# Patient Record
Sex: Male | Born: 1960 | Race: Black or African American | Hispanic: No | Marital: Single | Smoking: Never smoker
Health system: Southern US, Community
[De-identification: ages and names within clinical notes are randomized; demographics above are authoritative.]

## PROBLEM LIST (undated history)

## (undated) DIAGNOSIS — K219 Gastro-esophageal reflux disease without esophagitis: Secondary | ICD-10-CM

## (undated) DIAGNOSIS — H903 Sensorineural hearing loss, bilateral: Secondary | ICD-10-CM

## (undated) DIAGNOSIS — D126 Benign neoplasm of colon, unspecified: Secondary | ICD-10-CM

## (undated) DIAGNOSIS — N6489 Other specified disorders of breast: Secondary | ICD-10-CM

## (undated) DIAGNOSIS — E079 Disorder of thyroid, unspecified: Secondary | ICD-10-CM

## (undated) DIAGNOSIS — J309 Allergic rhinitis, unspecified: Secondary | ICD-10-CM

## (undated) DIAGNOSIS — E2740 Unspecified adrenocortical insufficiency: Secondary | ICD-10-CM

## (undated) DIAGNOSIS — H269 Unspecified cataract: Secondary | ICD-10-CM

## (undated) DIAGNOSIS — E039 Hypothyroidism, unspecified: Secondary | ICD-10-CM

## (undated) DIAGNOSIS — Z923 Personal history of irradiation: Secondary | ICD-10-CM

## (undated) DIAGNOSIS — B351 Tinea unguium: Secondary | ICD-10-CM

## (undated) DIAGNOSIS — Z125 Encounter for screening for malignant neoplasm of prostate: Secondary | ICD-10-CM

## (undated) DIAGNOSIS — I83893 Varicose veins of bilateral lower extremities with other complications: Secondary | ICD-10-CM

## (undated) DIAGNOSIS — F840 Autistic disorder: Secondary | ICD-10-CM

## (undated) DIAGNOSIS — C801 Malignant (primary) neoplasm, unspecified: Secondary | ICD-10-CM

## (undated) HISTORY — DX: Unspecified cataract: H26.9

## (undated) HISTORY — DX: Hypothyroidism, unspecified: E03.9

## (undated) HISTORY — PX: COLON SURGERY: SHX602

## (undated) HISTORY — PX: HERNIA REPAIR: SHX51

## (undated) HISTORY — DX: Other specified disorders of breast: N64.89

## (undated) HISTORY — DX: Autistic disorder: F84.0

## (undated) HISTORY — PX: EYE SURGERY: SHX253

## (undated) HISTORY — DX: Unspecified adrenocortical insufficiency: E27.40

## (undated) HISTORY — DX: Encounter for screening for malignant neoplasm of prostate: Z12.5

## (undated) HISTORY — DX: Tinea unguium: B35.1

## (undated) HISTORY — PX: OTHER SURGICAL HISTORY: SHX169

## (undated) HISTORY — DX: Benign neoplasm of colon, unspecified: D12.6

## (undated) HISTORY — DX: Varicose veins of bilateral lower extremities with other complications: I83.893

## (undated) HISTORY — DX: Sensorineural hearing loss, bilateral: H90.3

## (undated) HISTORY — DX: Allergic rhinitis, unspecified: J30.9

## (undated) HISTORY — DX: Gastro-esophageal reflux disease without esophagitis: K21.9

---

## 1998-04-15 ENCOUNTER — Emergency Department (HOSPITAL_COMMUNITY): Admission: EM | Admit: 1998-04-15 | Discharge: 1998-04-15 | Payer: Self-pay | Admitting: *Deleted

## 1998-04-16 ENCOUNTER — Inpatient Hospital Stay (HOSPITAL_COMMUNITY): Admission: EM | Admit: 1998-04-16 | Discharge: 1998-05-08 | Payer: Self-pay | Admitting: Emergency Medicine

## 1998-04-16 ENCOUNTER — Encounter: Payer: Self-pay | Admitting: Emergency Medicine

## 1998-04-17 ENCOUNTER — Encounter: Payer: Self-pay | Admitting: Critical Care Medicine

## 1998-04-28 ENCOUNTER — Encounter: Payer: Self-pay | Admitting: Internal Medicine

## 1998-05-04 ENCOUNTER — Encounter: Payer: Self-pay | Admitting: Internal Medicine

## 1998-12-02 ENCOUNTER — Encounter: Admission: RE | Admit: 1998-12-02 | Discharge: 1998-12-02 | Payer: Self-pay | Admitting: Internal Medicine

## 1999-01-06 ENCOUNTER — Encounter: Admission: RE | Admit: 1999-01-06 | Discharge: 1999-01-06 | Payer: Self-pay | Admitting: Internal Medicine

## 1999-02-02 ENCOUNTER — Encounter: Admission: RE | Admit: 1999-02-02 | Discharge: 1999-02-02 | Payer: Self-pay | Admitting: Hematology and Oncology

## 1999-06-24 ENCOUNTER — Encounter: Admission: RE | Admit: 1999-06-24 | Discharge: 1999-06-24 | Payer: Self-pay | Admitting: Internal Medicine

## 2000-01-24 ENCOUNTER — Encounter: Admission: RE | Admit: 2000-01-24 | Discharge: 2000-01-24 | Payer: Self-pay | Admitting: Internal Medicine

## 2000-04-10 ENCOUNTER — Encounter: Admission: RE | Admit: 2000-04-10 | Discharge: 2000-04-10 | Payer: Self-pay | Admitting: Internal Medicine

## 2000-04-18 ENCOUNTER — Encounter: Admission: RE | Admit: 2000-04-18 | Discharge: 2000-04-18 | Payer: Self-pay | Admitting: Hematology and Oncology

## 2001-01-08 ENCOUNTER — Encounter: Admission: RE | Admit: 2001-01-08 | Discharge: 2001-01-08 | Payer: Self-pay | Admitting: Internal Medicine

## 2001-11-07 ENCOUNTER — Encounter: Admission: RE | Admit: 2001-11-07 | Discharge: 2001-11-07 | Payer: Self-pay | Admitting: Internal Medicine

## 2002-06-12 ENCOUNTER — Encounter: Admission: RE | Admit: 2002-06-12 | Discharge: 2002-06-12 | Payer: Self-pay | Admitting: Internal Medicine

## 2002-07-01 ENCOUNTER — Encounter: Admission: RE | Admit: 2002-07-01 | Discharge: 2002-07-01 | Payer: Self-pay | Admitting: Internal Medicine

## 2003-02-06 ENCOUNTER — Encounter: Admission: RE | Admit: 2003-02-06 | Discharge: 2003-02-06 | Payer: Self-pay | Admitting: Internal Medicine

## 2003-06-18 ENCOUNTER — Encounter: Admission: RE | Admit: 2003-06-18 | Discharge: 2003-06-18 | Payer: Self-pay | Admitting: Internal Medicine

## 2003-11-06 ENCOUNTER — Encounter: Admission: RE | Admit: 2003-11-06 | Discharge: 2003-11-06 | Payer: Self-pay | Admitting: Internal Medicine

## 2003-11-24 ENCOUNTER — Encounter: Admission: RE | Admit: 2003-11-24 | Discharge: 2003-11-24 | Payer: Self-pay | Admitting: Internal Medicine

## 2004-03-17 ENCOUNTER — Encounter: Admission: RE | Admit: 2004-03-17 | Discharge: 2004-03-17 | Payer: Self-pay | Admitting: Internal Medicine

## 2004-05-12 ENCOUNTER — Ambulatory Visit: Payer: Self-pay | Admitting: Internal Medicine

## 2004-08-30 ENCOUNTER — Ambulatory Visit: Payer: Self-pay | Admitting: Internal Medicine

## 2004-09-01 ENCOUNTER — Ambulatory Visit: Payer: Self-pay | Admitting: Internal Medicine

## 2004-10-11 ENCOUNTER — Ambulatory Visit: Payer: Self-pay | Admitting: Internal Medicine

## 2004-10-25 ENCOUNTER — Ambulatory Visit: Payer: Self-pay | Admitting: Internal Medicine

## 2005-08-24 ENCOUNTER — Ambulatory Visit: Payer: Self-pay | Admitting: Internal Medicine

## 2005-10-13 ENCOUNTER — Ambulatory Visit: Payer: Self-pay | Admitting: Internal Medicine

## 2005-12-14 ENCOUNTER — Encounter: Payer: Self-pay | Admitting: Pulmonary Disease

## 2005-12-14 ENCOUNTER — Encounter: Payer: Self-pay | Admitting: Internal Medicine

## 2006-04-26 ENCOUNTER — Ambulatory Visit: Payer: Self-pay | Admitting: Internal Medicine

## 2006-06-30 DIAGNOSIS — E039 Hypothyroidism, unspecified: Secondary | ICD-10-CM

## 2006-06-30 HISTORY — DX: Hypothyroidism, unspecified: E03.9

## 2006-08-25 ENCOUNTER — Ambulatory Visit: Payer: Self-pay | Admitting: Hospitalist

## 2006-08-25 ENCOUNTER — Encounter (INDEPENDENT_AMBULATORY_CARE_PROVIDER_SITE_OTHER): Payer: Self-pay | Admitting: Internal Medicine

## 2006-08-25 LAB — CONVERTED CEMR LAB
BUN: 15 mg/dL (ref 6–23)
CO2: 31 meq/L (ref 19–32)
Calcium: 9.5 mg/dL (ref 8.4–10.5)
Chloride: 104 meq/L (ref 96–112)
Creatinine, Ser: 1.03 mg/dL (ref 0.40–1.50)
Free T4: 0.98 ng/dL (ref 0.89–1.80)
Glucose, Bld: 89 mg/dL (ref 70–99)
HCT: 37.3 % — ABNORMAL LOW (ref 39.0–52.0)
Hemoglobin: 11.2 g/dL — ABNORMAL LOW (ref 13.0–17.0)
MCHC: 30 g/dL (ref 30.0–36.0)
MCV: 76.9 fL — ABNORMAL LOW (ref 78.0–100.0)
PSA: 0.43 ng/mL (ref 0.10–4.00)
Platelets: 122 10*3/uL — ABNORMAL LOW (ref 150–400)
Potassium: 4.2 meq/L (ref 3.5–5.3)
RBC: 4.85 M/uL (ref 4.22–5.81)
RDW: 14.3 % — ABNORMAL HIGH (ref 11.5–14.0)
Sodium: 142 meq/L (ref 135–145)
TSH: 0.495 microintl units/mL (ref 0.350–5.50)
WBC: 6.9 10*3/uL (ref 4.0–10.5)

## 2006-09-18 ENCOUNTER — Telehealth: Payer: Self-pay | Admitting: *Deleted

## 2006-09-25 ENCOUNTER — Telehealth: Payer: Self-pay | Admitting: *Deleted

## 2006-12-18 ENCOUNTER — Telehealth (INDEPENDENT_AMBULATORY_CARE_PROVIDER_SITE_OTHER): Payer: Self-pay | Admitting: *Deleted

## 2007-01-17 ENCOUNTER — Telehealth: Payer: Self-pay | Admitting: *Deleted

## 2007-02-05 ENCOUNTER — Encounter (INDEPENDENT_AMBULATORY_CARE_PROVIDER_SITE_OTHER): Payer: Self-pay | Admitting: Internal Medicine

## 2007-02-05 ENCOUNTER — Ambulatory Visit: Payer: Self-pay | Admitting: Internal Medicine

## 2007-02-08 LAB — CONVERTED CEMR LAB
ALT: 13 units/L (ref 0–53)
AST: 21 units/L (ref 0–37)
Albumin: 4.9 g/dL (ref 3.5–5.2)
Alkaline Phosphatase: 74 units/L (ref 39–117)
BUN: 15 mg/dL (ref 6–23)
CO2: 31 meq/L (ref 19–32)
Calcium: 10.1 mg/dL (ref 8.4–10.5)
Chloride: 103 meq/L (ref 96–112)
Creatinine, Ser: 1.08 mg/dL (ref 0.40–1.50)
Glucose, Bld: 69 mg/dL — ABNORMAL LOW (ref 70–99)
HCT: 39.8 % (ref 39.0–52.0)
Hemoglobin: 12.2 g/dL — ABNORMAL LOW (ref 13.0–17.0)
MCHC: 30.7 g/dL (ref 30.0–36.0)
MCV: 79.6 fL (ref 78.0–100.0)
Platelets: 145 10*3/uL — ABNORMAL LOW (ref 150–400)
Potassium: 4 meq/L (ref 3.5–5.3)
RBC: 5 M/uL (ref 4.22–5.81)
RDW: 14.8 % — ABNORMAL HIGH (ref 11.5–14.0)
Sodium: 140 meq/L (ref 135–145)
TSH: 8.1 microintl units/mL — ABNORMAL HIGH (ref 0.350–5.50)
Total Bilirubin: 0.5 mg/dL (ref 0.3–1.2)
Total Protein: 7.8 g/dL (ref 6.0–8.3)
WBC: 7 10*3/uL (ref 4.0–10.5)

## 2007-06-06 ENCOUNTER — Telehealth (INDEPENDENT_AMBULATORY_CARE_PROVIDER_SITE_OTHER): Payer: Self-pay | Admitting: *Deleted

## 2007-12-13 ENCOUNTER — Encounter (INDEPENDENT_AMBULATORY_CARE_PROVIDER_SITE_OTHER): Payer: Self-pay | Admitting: *Deleted

## 2007-12-13 ENCOUNTER — Ambulatory Visit: Payer: Self-pay | Admitting: Infectious Disease

## 2008-03-12 ENCOUNTER — Encounter (INDEPENDENT_AMBULATORY_CARE_PROVIDER_SITE_OTHER): Payer: Self-pay | Admitting: Internal Medicine

## 2008-03-12 ENCOUNTER — Ambulatory Visit: Payer: Self-pay | Admitting: Internal Medicine

## 2008-03-12 LAB — CONVERTED CEMR LAB
BUN: 9 mg/dL (ref 6–23)
Bilirubin Urine: NEGATIVE
Blood in Urine, dipstick: NEGATIVE
CO2: 28 meq/L (ref 19–32)
Calcium: 10.1 mg/dL (ref 8.4–10.5)
Glucose, Bld: 90 mg/dL (ref 70–99)
Glucose, Urine, Semiquant: NEGATIVE
Hemoglobin, Urine: NEGATIVE
Ketones, ur: NEGATIVE mg/dL
Nitrite: NEGATIVE
Nitrite: NEGATIVE
Potassium: 4 meq/L (ref 3.5–5.3)
Protein, U semiquant: NEGATIVE
Specific Gravity, Urine: >=1.03
Specific Gravity, Urine: 1.018 (ref 1.005–1.03)
TSH: 0.752 microintl units/mL (ref 0.350–4.50)
Urobilinogen, UA: 0.2
Urobilinogen, UA: 0.2 (ref 0.0–1.0)
WBC Urine, dipstick: NEGATIVE
pH: 5.5
pH: 6 (ref 5.0–8.0)

## 2008-04-08 ENCOUNTER — Ambulatory Visit: Payer: Self-pay | Admitting: *Deleted

## 2008-05-06 ENCOUNTER — Ambulatory Visit: Payer: Self-pay | Admitting: Internal Medicine

## 2008-05-06 ENCOUNTER — Encounter: Payer: Self-pay | Admitting: Internal Medicine

## 2008-05-06 LAB — CONVERTED CEMR LAB: Ferritin: 85 ng/mL (ref 22–322)

## 2008-05-07 LAB — CONVERTED CEMR LAB
Eosinophils Absolute: 0.3 10*3/uL (ref 0.0–0.7)
Eosinophils Relative: 4 % (ref 0–5)
HCT: 36.3 % — ABNORMAL LOW (ref 39.0–52.0)
Hemoglobin: 11.4 g/dL — ABNORMAL LOW (ref 13.0–17.0)
Lymphocytes Relative: 26 % (ref 12–46)
Lymphs Abs: 1.8 10*3/uL (ref 0.7–4.0)
MCV: 77.7 fL — ABNORMAL LOW (ref 78.0–100.0)
Monocytes Absolute: 0.6 10*3/uL (ref 0.1–1.0)
Monocytes Relative: 8 % (ref 3–12)
WBC: 6.8 10*3/uL (ref 4.0–10.5)

## 2008-05-14 ENCOUNTER — Encounter (INDEPENDENT_AMBULATORY_CARE_PROVIDER_SITE_OTHER): Payer: Self-pay | Admitting: Internal Medicine

## 2008-05-14 ENCOUNTER — Ambulatory Visit: Payer: Self-pay | Admitting: Cardiovascular Disease

## 2008-05-14 ENCOUNTER — Ambulatory Visit (HOSPITAL_COMMUNITY): Admission: RE | Admit: 2008-05-14 | Discharge: 2008-05-14 | Payer: Self-pay | Admitting: Internal Medicine

## 2008-05-19 ENCOUNTER — Encounter (INDEPENDENT_AMBULATORY_CARE_PROVIDER_SITE_OTHER): Payer: Self-pay | Admitting: Internal Medicine

## 2008-05-19 ENCOUNTER — Ambulatory Visit (HOSPITAL_COMMUNITY): Admission: RE | Admit: 2008-05-19 | Discharge: 2008-05-19 | Payer: Self-pay | Admitting: Internal Medicine

## 2008-09-29 ENCOUNTER — Encounter (INDEPENDENT_AMBULATORY_CARE_PROVIDER_SITE_OTHER): Payer: Self-pay | Admitting: Internal Medicine

## 2008-09-29 ENCOUNTER — Ambulatory Visit: Payer: Self-pay | Admitting: *Deleted

## 2008-09-29 LAB — CONVERTED CEMR LAB
ALT: 8 units/L (ref 0–53)
BUN: 15 mg/dL (ref 6–23)
CO2: 28 meq/L (ref 19–32)
Calcium: 9.3 mg/dL (ref 8.4–10.5)
Chloride: 107 meq/L (ref 96–112)
Creatinine, Ser: 1.03 mg/dL (ref 0.40–1.50)
Glucose, Bld: 76 mg/dL (ref 70–99)
Total Bilirubin: 0.6 mg/dL (ref 0.3–1.2)

## 2008-12-15 ENCOUNTER — Telehealth (INDEPENDENT_AMBULATORY_CARE_PROVIDER_SITE_OTHER): Payer: Self-pay | Admitting: Internal Medicine

## 2009-01-14 ENCOUNTER — Ambulatory Visit: Payer: Self-pay | Admitting: Internal Medicine

## 2009-01-14 ENCOUNTER — Encounter (INDEPENDENT_AMBULATORY_CARE_PROVIDER_SITE_OTHER): Payer: Self-pay | Admitting: Internal Medicine

## 2009-01-14 LAB — CONVERTED CEMR LAB
Chloride: 105 meq/L (ref 96–112)
Creatinine, Ser: 1.08 mg/dL (ref 0.40–1.50)
GFR calc non Af Amer: >60 mL/min (ref >60)
Potassium: 4 meq/L (ref 3.5–5.3)
Sodium: 143 meq/L (ref 135–145)
TSH: 0.455 microintl units/mL (ref 0.350–4.500)

## 2009-02-17 ENCOUNTER — Encounter: Payer: Self-pay | Admitting: Internal Medicine

## 2009-02-17 ENCOUNTER — Ambulatory Visit: Payer: Self-pay | Admitting: Internal Medicine

## 2009-02-17 LAB — CONVERTED CEMR LAB
Chloride: 102 meq/L (ref 96–112)
Creatinine, Ser: 1.06 mg/dL (ref 0.40–1.50)
Sodium: 141 meq/L (ref 135–145)

## 2009-06-02 ENCOUNTER — Ambulatory Visit: Payer: Self-pay | Admitting: Internal Medicine

## 2009-06-02 LAB — CONVERTED CEMR LAB

## 2009-09-17 ENCOUNTER — Ambulatory Visit: Payer: Self-pay | Admitting: Internal Medicine

## 2009-09-17 LAB — CONVERTED CEMR LAB
Cholesterol: 146 mg/dL (ref 0–200)
Total CHOL/HDL Ratio: 2.9
Triglycerides: 52 mg/dL (ref <150)

## 2009-09-19 ENCOUNTER — Emergency Department (HOSPITAL_COMMUNITY): Admission: EM | Admit: 2009-09-19 | Discharge: 2009-09-19 | Payer: Self-pay | Admitting: Emergency Medicine

## 2009-12-18 ENCOUNTER — Ambulatory Visit: Payer: Self-pay | Admitting: Internal Medicine

## 2009-12-18 LAB — CONVERTED CEMR LAB
Albumin: 4.8 g/dL (ref 3.5–5.2)
Alkaline Phosphatase: 81 units/L (ref 39–117)
BUN: 15 mg/dL (ref 6–23)
Calcium: 9.9 mg/dL (ref 8.4–10.5)
Chloride: 101 meq/L (ref 96–112)
Free T4: 1.18 ng/dL (ref 0.80–1.80)
Glucose, Bld: 97 mg/dL (ref 70–99)
Hemoglobin, Urine: NEGATIVE
Leukocytes, UA: NEGATIVE
Nitrite: NEGATIVE
Potassium: 4 meq/L (ref 3.5–5.3)
Protein, ur: NEGATIVE mg/dL

## 2010-01-04 ENCOUNTER — Ambulatory Visit: Payer: Self-pay | Admitting: Internal Medicine

## 2010-01-06 LAB — CONVERTED CEMR LAB
Calcium: 10 mg/dL (ref 8.4–10.5)
Creatinine, Ser: 0.99 mg/dL (ref 0.40–1.50)

## 2010-06-22 ENCOUNTER — Telehealth: Payer: Self-pay | Admitting: Internal Medicine

## 2010-06-28 ENCOUNTER — Ambulatory Visit: Payer: Self-pay | Admitting: Internal Medicine

## 2010-06-28 DIAGNOSIS — R634 Abnormal weight loss: Secondary | ICD-10-CM

## 2010-06-28 LAB — CONVERTED CEMR LAB
MCV: 81.2 fL (ref >=78.0)
PSA: 0.51 ng/mL (ref <=4.00)
Platelets: 139 10*3/uL — ABNORMAL LOW (ref 150–400)
WBC: 6.6 10*3/uL (ref 4.0–10.5)

## 2010-07-07 ENCOUNTER — Ambulatory Visit: Payer: Self-pay | Admitting: Internal Medicine

## 2010-07-07 LAB — CONVERTED CEMR LAB
OCCULT 2: NEGATIVE
OCCULT 3: NEGATIVE

## 2010-09-14 NOTE — Assessment & Plan Note (Signed)
Summary: BP/(DEVANI)SB.   Vital Signs:  Patient profile:   50 year old male Height:      68 inches Weight:      156.7 pounds BMI:     23.91 Temp:     97.9 degrees F oral Pulse rate:   90 / minute BP sitting:   140 / 97  (right arm)  Vitals Entered By: Filomena Jungling NT II (Dec 18, 2009 1:32 PM) CC: blood pressure high Is Patient Diabetic? No Pain Assessment Patient in pain? no      Nutritional Status BMI of 19 -24 = normal  Have you ever been in a relationship where you felt threatened, hurt or afraid?No   Does patient need assistance? Functional Status Self care Ambulation Normal   Primary Care Provider:  Bethel Born MD  CC:  blood pressure high.  History of Present Illness: Mr. Riegler is a 50 yo man with PMH as outlined in the EMR comes today for a f/u visit. He comes with his mother, who mostly provides the history.   1. Adrenal insufficiency: He takes hydrocortisone and fludrocortisone.   2. Mental Retardation: Ge goes to school at [employer] and is doing well.   3. Hypothyroidism: He takes his thyroid medications regularly.   4. Urinary frequency: He is going to bathroom more frequently and he denies dysuria. he denies any fever or chills.   5. High blood pressure: He went to a dentist to have his tooth pulled and it was cancelled as his BP was 150/109 2 days ago. They do not check his BP at home.    Preventive Screening-Counseling & Management  Alcohol-Tobacco     Alcohol drinks/day: 0     Smoking Status: never  Caffeine-Diet-Exercise     Does Patient Exercise: yes     Type of exercise: WALKING     Times/week: 5  Current Medications (verified): 1)  Synthroid 100 Mcg Tabs (Levothyroxine Sodium) .... Take 1 Tablet By Mouth Once A Day 2)  Hydrocortisone 20 Mg Tabs (Hydrocortisone) .... Take 1 Tablet By Mouth Once A Day 3)  Fludrocortisone Acetate 0.1 Mg Tabs (Fludrocortisone Acetate) .... Take 1/2 Tablet By Mouth Once A Day 4)  Multivitamins  Tabs (Multiple  Vitamin) .... Take 1 Tablet By Mouth Once A Day  Allergies: No Known Drug Allergies  Review of Systems      See HPI  Physical Exam  General:  alert.   Mouth:  pharynx pink and moist.   Lungs:  normal breath sounds, no crackles, and no wheezes.   Heart:  normal rate and regular rhythm.     Impression & Recommendations:  Problem # 1:  HYPERTENSION (ICD-401.9) Pt has multiple documented increased BP readings, especially diastolic BP. He has been counselled multiple times in the past to take less salt and he complies with this. 2 days ago his elective procedure for tooth extraction was stopped because of his BP which was 150/109. His BP on last visit was 160/90 and today it was 140/97. So at this point, I feel comfortable to diagnose him as having HTN. My plan is to decrease the dose of fludrocortisone from 0.1 mg to 0.05 mg daily and f/u in 2 wks. Then we will check his BP, orthostatic pressure, plasma renin activity (with goal PRA at upper normal range). He and his mother is instructed to note for any dizziness or any other funny symptoms and to notify the clinic asap. Please note today that he is not orthostatic.  He is also instructed not to take any extra salt.   Problem # 2:  FREQUENCY, URINARY (ICD-788.41) Doubt this is UTI, given complete symptomatology. Noted pt had this symptom in the past as well. Since he is taking hydrocortisone and is at risk for DM and this can also cause urinary frequency, will check A1C.  Orders: T-Hgb A1C (in-house) ([id number]) T-Urinalysis ([id number])   Problem # 3:  PREVENTIVE HEALTH CARE (ICD-V70.0) Pt and mother denies to get colonoscopy today as well. I noted microcytic anemia in the past and after reading prior office notes there was also some concern for wt. loss. So I would make a stronger recommendation for a colonoscopy on next appt.   Problem # 4:  ADRENAL INSUFFICIENCY, HX OF (ICD-V13.8) Please see HTN. Will check following.    Orders: T-Comprehensive Metabolic Panel ([id number])   Problem # 5:  HYPOTHYROIDISM (ICD-244.9)  Will check followings.   Orders: T-TSH ([id number]) T-T4, Free [id number])   His updated medication list for this problem includes:    Synthroid 100 Mcg Tabs (Levothyroxine sodium) .Marland Kitchen... Take 1 tablet by mouth once a day    Complete Medication List: 1)  Synthroid 100 Mcg Tabs (Levothyroxine sodium) .... Take 1 tablet by mouth once a day 2)  Hydrocortisone 20 Mg Tabs (Hydrocortisone) .... Take 1 tablet by mouth once a day 3)  Fludrocortisone Acetate 0.1 Mg Tabs (Fludrocortisone acetate) .... Take 1/2 tablet by mouth once a day 4)  Multivitamins Tabs (Multiple vitamin) .... Take 1 tablet by mouth once a day T-Comprehensive Metabolic Panel ([id number]) T-TSH [phone]) T-T4, Free [phone]) T-Hgb A1C (in-house) ([id number]) T-Urinalysis ([id number])  Patient Instructions: 1)  Limit your Sodium (Salt) to less than 2 grams a day(slightly less than 1/2 a teaspoon) to prevent fluid retention, swelling, or worsening of symptoms. 2)  It is important that you exercise regularly at least 20 minutes 5 times a week. If you develop chest pain, have severe difficulty breathing, or feel very tired , stop exercising immediately and seek medical attention. 3)  You need to lose weight. Consider a lower calorie diet and regular exercise.  4)  Check your Blood Pressure regularly. If it is above: you should make an appointment. 5)  Please schedule a follow-up appointment in 2 weeks. Process Orders Check Orders Results:     [facility]: Check successful Tests Sent for requisitioning (Dec 18, 2009 2:36 PM):     12/18/2009: [facility] -- T-Comprehensive Metabolic Panel [[id number]] (signed)     12/18/2009: [facility] -- T-TSH [phone] (signed)     12/18/2009: [facility] -- [place], [place] [[id number]] (signed)      12/18/2009: [facility] -- T-Urinalysis [phone] (signed)    Process Orders Check Orders Results:     [facility]: Check successful Tests Sent for requisitioning (Dec 18, 2009 2:36 PM):     12/18/2009: [facility] -- T-Comprehensive Metabolic Panel [[id number]] (signed)     12/18/2009: [facility] -- T-TSH [phone] (signed)     12/18/2009: [facility] -- [place], [place] [[id number]] (signed)     12/18/2009: [facility] -- T-Urinalysis [[id number]] (signed)    Prevention & Chronic Care Immunizations   Influenza vaccine: Fluvax 3+  (06/02/2009)   Influenza vaccine deferral: Deferred  (09/17/2009)   Influenza vaccine due: 04/15/2010    Tetanus booster: Not documented   Td booster deferral: Deferred  (06/02/2009)   Tetanus booster due: 02/18/2019    Pneumococcal vaccine: Not  documented   Pneumococcal vaccine deferral: Refused  (09/17/2009)  Colorectal Screening   Hemoccult: Not documented   Hemoccult action/deferral: Refused  (09/17/2009)    Colonoscopy: Not documented   Colonoscopy action/deferral: Refused  (12/18/2009)  Other Screening   PSA: 0.43  (08/25/2006)   PSA action/deferral: Discussed-decision deferred  (06/02/2009)   Smoking status: never  (12/18/2009)  Lipids   Total Cholesterol: 146  (09/17/2009)   Lipid panel action/deferral: Lipid Panel ordered   LDL: 85  (09/17/2009)   LDL Direct: Not documented   HDL: 51  (09/17/2009)   Triglycerides: 52  (09/17/2009)  Hypertension   Last Blood Pressure: 140 / 97  (12/18/2009)   Serum creatinine: 1.06  (02/17/2009)   Serum potassium 4.3  (02/17/2009) CMP ordered   Self-Management Support :    Hypertension self-management support: Not documented   Nursing Instructions: GI referral for screening colonoscopy (see order)     Impression & Recommendations:  Orders: T-Hgb A1C (in-house)  ([id number]) T-Urinalysis ([id number])   Orders: T-Comprehensive Metabolic Panel ([id number])   His updated medication list for this problem includes:    Synthroid 100 Mcg Tabs (Levothyroxine sodium) .Marland Kitchen... Take 1 tablet by mouth once a day  Orders: T-TSH ([id number]) T-T4, Free [id number])   Complete Medication List: 1)  Synthroid 100 Mcg Tabs (Levothyroxine sodium) .... Take 1 tablet by mouth once a day 2)  Hydrocortisone 20 Mg Tabs (Hydrocortisone) .... Take 1 tablet by mouth once a day 3)  Fludrocortisone Acetate 0.1 Mg Tabs (Fludrocortisone acetate) .... Take 1/2 tablet by mouth once a day 4)  Multivitamins Tabs (Multiple vitamin) .... Take 1 tablet by mouth once a day   Appended Document: Lab Order/a1c result    Lab Visit  Laboratory Results   Blood Tests   Date/Time Received: Dec 18, 2009 2:37 PM Date/Time Reported: Alric Quan  Dec 18, 2009 2:37 PM   HGBA1C: 5.4%   (Normal Range: Non-Diabetic - 3-6%   Control Diabetic - 6-8%)    Orders Today:   Appended Document: orthostatic bp.    Nurse Visit   Vital Signs:  Patient profile:   50 year old male Pulse (ortho):   86 / minute BP standing:   130 / 100  Serial Vital Signs/Assessments:  Time      Position  BP       Pulse  Resp  Temp     By 3:26 PM   Lying RA  130/100  86                    Mamie Hague NT II 3:26 PM   Sitting   130/90   86                    Mamie Hague NT II 3:26 PM   Standing  130/100  86                    Mamie Hague NT II   Allergies: No Known Drug Allergies

## 2010-09-14 NOTE — Assessment & Plan Note (Signed)
Summary: FU/SB.   Vital Signs:  Patient profile:   50 year old male Height:      68 inches Weight:      155.6 pounds BMI:     23.74 Temp:     97.2 degrees F oral Pulse rate:   84 / minute BP sitting:   133 / 84  (right arm)  Vitals Entered By: Filomena Jungling NT II (Jan 04, 2010 9:19 AM) CC: follow-up visit Is Patient Diabetic? No Pain Assessment Patient in pain? no      Nutritional Status BMI of 19 -24 = normal  Have you ever been in a relationship where you felt threatened, hurt or afraid?No   Does patient need assistance? Functional Status Self care Ambulation Normal   Primary Care Provider:  Bethel Born MD  CC:  follow-up visit.  History of Present Illness: 48yom with pmh outlined in chart here for 2 week check up.  At last visit he had been having trouble with HTN, a dental procedure had been cancelled for BP of 150/109.  He saw Dr. Aleene Davidson who decreased his fludrocortisone dose from 0.1 to 0.05.  Today he is here for lab results and BP recheck.  He is here with his mother who is his primary care taker.  They report that he has been doing well, no change in energy level, no dizzyness, no change in appetite.  He continues to have urinary frequency, no other urinary tract symptoms, UA at last visit was negative.  Preventive Screening-Counseling & Management  Alcohol-Tobacco     Alcohol drinks/day: 0     Smoking Status: never  Caffeine-Diet-Exercise     Does Patient Exercise: yes     Type of exercise: WALKING     Times/week: 5  Current Medications (verified): 1)  Synthroid 100 Mcg Tabs (Levothyroxine Sodium) .... Take 1 Tablet By Mouth Once A Day 2)  Hydrocortisone 20 Mg Tabs (Hydrocortisone) .... Take 1 Tablet By Mouth Once A Day 3)  Fludrocortisone Acetate 0.1 Mg Tabs (Fludrocortisone Acetate) .... Take 1/2 Tablet By Mouth Once A Day 4)  Multivitamins  Tabs (Multiple Vitamin) .... Take 1 Tablet By Mouth Once A Day  Allergies (verified): No Known Drug  Allergies  Review of Systems       per hpi  Physical Exam  General:  alert and well-developed.   Head:  normocephalic and atraumatic.   Mouth:  pharynx pink and moist, poor dentition, and teeth missing.   Lungs:  normal respiratory effort and normal breath sounds.   Heart:  normal rate, regular rhythm, no murmur, and no gallop.   Pulses:  2+ Extremities:  no edema Neurologic:  alert & oriented X3, cranial nerves II-XII intact, strength normal in all extremities, and gait normal.   Psych:  Oriented X3, memory intact for recent and remote, normally interactive, and good eye contact.     Impression & Recommendations:  Problem # 1:  HYPERTENSION (ICD-401.9) improved from last few clinic readings of 140, 160 and from the dental office reading of 150/100.  I suspect as there are many normal bps in his record that there is some fluctuation in his bp and he was probably (understandably) nervous at the dental office.  Anyway the decrease in fludrocortisone has not seemed to cause any sypmtoms so I will continue at the current dose.  Discussed with he and his mother again that if they notice fatigue, confusion, edema, etc he should come right in.  BP today: 133/84 Prior BP:  130/100 (12/18/2009)  Labs Reviewed: K+: 4.0 (12/18/2009) Creat: : 1.06 (12/18/2009)   Chol: 146 (09/17/2009)   HDL: 51 (09/17/2009)   LDL: 85 (09/17/2009)   TG: 52 (09/17/2009)  Problem # 2:  FREQUENCY, URINARY (ICD-788.41) no dysuria, no incontinence, UA at last check was wnl. does have significant family hx of prostate CA so will recheck PSA.  Orders: T-PSA ([id number])  Problem # 3:  ADRENAL INSUFFICIENCY, HX OF (ICD-V13.8)  New change in fludrocortisone dose (0.1mg  to 0.05mg ) seems to have helped blood pressure and he is tolerating the change well.  Do not see that we need to check the renin level.  Will check a BMET.  Orders: T-Basic Metabolic Panel [id number])  Problem # 4:  CARDIAC MURMUR, SYSTOLIC  (ICD-785.2) Noted. 2/6 systolic murmur.  Asymptomatic and chronic.  Complete Medication List: 1)  Synthroid 100 Mcg Tabs (Levothyroxine sodium) .... Take 1 tablet by mouth once a day 2)  Hydrocortisone 20 Mg Tabs (Hydrocortisone) .... Take 1 tablet by mouth once a day 3)  Fludrocortisone Acetate 0.1 Mg Tabs (Fludrocortisone acetate) .... Take 1/2 tablet by mouth once a day 4)  Multivitamins Tabs (Multiple vitamin) .... Take 1 tablet by mouth once a day  Patient Instructions: 1)  You had labwork done today, we will call you if there is anything that needs to be discussed before your next appointment. 2)  Please schedule a follow-up appointment in 3 months. 3)  Please call the clinic if you notice decreased energy, dizzyness or fainting.  Prevention & Chronic Care Immunizations   Influenza vaccine: Fluvax 3+  (06/02/2009)   Influenza vaccine deferral: Deferred  (09/17/2009)   Influenza vaccine due: 04/15/2010    Tetanus booster: Not documented   Td booster deferral: Deferred  (06/02/2009)   Tetanus booster due: 02/18/2019    Pneumococcal vaccine: Not documented   Pneumococcal vaccine deferral: Refused  (09/17/2009)  Colorectal Screening   Hemoccult: Not documented   Hemoccult action/deferral: Refused  (09/17/2009)    Colonoscopy: Not documented   Colonoscopy action/deferral: Refused  (12/18/2009)  Other Screening   PSA: 0.43  (08/25/2006)   PSA ordered.   PSA action/deferral: Discussed-decision deferred  (06/02/2009)   Smoking status: never  (01/04/2010)  Lipids   Total Cholesterol: 146  (09/17/2009)   Lipid panel action/deferral: Lipid Panel ordered   LDL: 85  (09/17/2009)   LDL Direct: Not documented   HDL: 51  (09/17/2009)   Triglycerides: 52  (09/17/2009)  Hypertension   Last Blood Pressure: 133 / 84  (01/04/2010)   Serum creatinine: 1.06  (12/18/2009)   Serum potassium 4.0  (12/18/2009)    Hypertension flowsheet reviewed?: Yes   Progress toward BP goal:  Improved  Self-Management Support :    Patient will work on the following items until the next clinic visit to reach self-care goals:     Medications and monitoring: take my medicines every day  (01/04/2010)     Eating: eat more vegetables, eat foods that are low in salt, eat baked foods instead of fried foods, eat fruit for snacks and desserts  (01/04/2010)     Activity: take a 30 minute walk every day  (01/04/2010)    Hypertension self-management support: Not documented   Process Orders Check Orders Results:     [facility]: Check successful Tests Sent for requisitioning (Jan 06, 2010 7:53 AM):     01/04/2010: [facility] -- T-PSA [[id number]] (signed)     01/04/2010: [facility] -- T-Basic Metabolic Panel [  [id number]] (signed)

## 2010-09-14 NOTE — Progress Notes (Signed)
Summary: refill/gg  Phone Note Refill Request  on June 22, 2010 3:54 PM  Refills Requested: Medication #1:  HYDROCORTISONE 20 MG TABS Take 1 tablet by mouth once a day   Dosage confirmed as above?Dosage Confirmed   Last Refilled: 03/13/2010  Medication #2:  SYNTHROID 100 MCG TABS Take 1 tablet by mouth once a day   Dosage confirmed as above?Dosage Confirmed   Last Refilled: 03/13/2010  Method Requested: Electronic Initial call taken by: Merrie Roof RN,  June 22, 2010 3:55 PM  Follow-up for Phone Call       Follow-up by: Bethel Born MD,  June 22, 2010 4:54 PM    Prescriptions: SYNTHROID 100 MCG TABS (LEVOTHYROXINE SODIUM) Take 1 tablet by mouth once a day  #90 x 2   Entered and Authorized by:   Bethel Born MD   Signed by:   Bethel Born MD on 06/22/2010   Method used:   Electronically to        [facility] [phone]* (retail)       [address]       [place], [place]  [address]       Ph: [phone]       Fax: [phone]   RxID:   [id number] HYDROCORTISONE 20 MG TABS (HYDROCORTISONE) Take 1 tablet by mouth once a day  #90 x 2   Entered and Authorized by:   Bethel Born MD   Signed by:   Bethel Born MD on 06/22/2010   Method used:   Electronically to        [facility] [phone]* (retail)       [address]       [place], [place]  [address]       Ph: [phone]       Fax: [phone]   RxID:   [id number]

## 2010-09-14 NOTE — Assessment & Plan Note (Signed)
Summary: losing weight/cfb   Vital Signs:  Patient profile:   50 year old male Height:      68 inches Weight:      164.2 pounds BMI:     25.06 Temp:     97.1 degrees F oral Pulse rate:   92 / minute BP sitting:   160 / 90  (right arm)  Vitals Entered By: Filomena Jungling NT II (September 17, 2009 8:47 AM) CC: hearing,  Is Patient Diabetic? No Pain Assessment Patient in pain? no      Nutritional Status BMI of 25 - 29 = overweight  Have you ever been in a relationship where you felt threatened, hurt or afraid?No   Does patient need assistance? Functional Status Self care Ambulation Normal Comments lives with mother   Primary Care Provider:  Bethel Born MD  CC:  hearing and .  History of Present Illness: 50 y/o man with autism comes for a routine follow up. No complaints.   Mother takes care of him. She feels that his bahavior is stable. No significant changes. He goes to special school and likes it there. He eats and drinks properly. Use to follow with behavioral health but  they said they could not do much so have not gone there in a while.     Preventive Screening-Counseling & Management  Alcohol-Tobacco     Alcohol drinks/day: 0     Smoking Status: never  Caffeine-Diet-Exercise     Does Patient Exercise: yes     Type of exercise: WALKING     Times/week: 5  Current Medications (verified): 1)  Synthroid 100 Mcg Tabs (Levothyroxine Sodium) .... Take 1 Tablet By Mouth Once A Day 2)  Hydrocortisone 20 Mg Tabs (Hydrocortisone) .... Take 1 Tablet By Mouth Once A Day 3)  Fludrocortisone Acetate 0.1 Mg Tabs (Fludrocortisone Acetate) .... Take 1 Tablet By Mouth Once A Day 4)  Multivitamins  Tabs (Multiple Vitamin) .... Take 1 Tablet By Mouth Once A Day  Allergies (verified): No Known Drug Allergies   Review of Systems  The patient denies anorexia, fever, weight loss, weight gain, vision loss, decreased hearing, hoarseness, chest pain, syncope, dyspnea on exertion,  peripheral edema, prolonged cough, headaches, hemoptysis, abdominal pain, melena, hematochezia, severe indigestion/heartburn, hematuria, incontinence, genital sores, muscle weakness, suspicious skin lesions, transient blindness, difficulty walking, depression, unusual weight change, abnormal bleeding, enlarged lymph nodes, angioedema, breast masses, and testicular masses.    Physical Exam  General:  alert, well-developed, well-nourished, well-hydrated, normal appearance, cooperative to examination, and good hygiene.   Head:  normocephalic and atraumatic.   Eyes:  vision grossly intact, pupils equal, pupils round, and pupils reactive to light.   Ears:  R ear normal and L ear normal.   Nose:  no external deformity and no nasal discharge.   Mouth:  teeth missing.   Neck:  supple, no thyromegaly, and no JVD.   Lungs:  normal respiratory effort, normal breath sounds, no dullness, and no wheezes.   Heart:  normal rate, regular rhythm, and systolic ejection murmur.   Abdomen:  soft, non-tender, normal bowel sounds, no distention, no masses, no guarding, no rigidity, no hepatomegaly, and no splenomegaly.   Pulses:  R radial normal and L radial normal.   Extremities:  No edema, cyanosis or clubbing bilaterally Neurologic:  alert & oriented X3.  strength normal in all extremities, sensation intact to light touch, and gait normal.     Impression & Recommendations:  Problem # 1:  ADRENAL  INSUFFICIENCY, HX OF (ICD-V13.8) stable. No complalints. Vitals ok. Had BMETs recently which were normal. Will get at the next visit.   Problem # 2:  HYPOTHYROIDISM (ICD-244.9)  No significant wt changes.  Will check TSH at next visit.  No symptoms manadating need for TSH at this time.   His updated medication list for this problem includes:    Synthroid 100 Mcg Tabs (Levothyroxine sodium) .Marland Kitchen... Take 1 tablet by mouth once a day  Labs Reviewed: TSH: 0.413 (02/17/2009)     Problem # 3:  MENTAL RETARDATION  (ICD-319) Stable. Good communciation at this visit. Enjoys school. Is able to perform basic activities. Eats and drinks ok. Good social support. Will talk to Dorothe Pea to see if we can utillize any further community resources for better QOL.   Complete Medication List: 1)  Synthroid 100 Mcg Tabs (Levothyroxine sodium) .... Take 1 tablet by mouth once a day 2)  Hydrocortisone 20 Mg Tabs (Hydrocortisone) .... Take 1 tablet by mouth once a day 3)  Fludrocortisone Acetate 0.1 Mg Tabs (Fludrocortisone acetate) .... Take 1 tablet by mouth once a day 4)  Multivitamins Tabs (Multiple vitamin) .... Take 1 tablet by mouth once a day  Other Orders: T-Lipid Profile ([id number])  Patient Instructions: 1)  Please schedule a follow-up appointment in 4 months. Prescriptions: FLUDROCORTISONE ACETATE 0.1 MG TABS (FLUDROCORTISONE ACETATE) Take 1 tablet by mouth once a day  #90 x 2   Entered and Authorized by:   Bethel Born MD   Signed by:   Bethel Born MD on 09/17/2009   Method used:   Electronically to        [facility] [phone]* (retail)       [address]       [place], [place]  [address]       Ph: [phone]       Fax: [phone]   RxID:   [id number] HYDROCORTISONE 20 MG TABS (HYDROCORTISONE) Take 1 tablet by mouth once a day  #90 x 2   Entered and Authorized by:   Bethel Born MD   Signed by:   Bethel Born MD on 09/17/2009   Method used:   Electronically to        [facility] [phone]* (retail)       [address]       [place], [place]  [address]       Ph: [phone]       Fax: [phone]   RxID:   [id number] SYNTHROID 100 MCG TABS (LEVOTHYROXINE SODIUM) Take 1 tablet by mouth once a day  #90 x 2   Entered and Authorized by:   Bethel Born MD   Signed by:   Bethel Born MD on 09/17/2009   Method used:   Electronically to        [facility] [address] [phone]* (retail)       [address]       [place], [place]  [address]       Ph: [phone]        Fax: [phone]   RxID:   [id number] FLUDROCORTISONE ACETATE 0.1 MG TABS (FLUDROCORTISONE ACETATE) Take 1 tablet by mouth once a day  #90 x 2   Entered and Authorized by:   Bethel Born MD   Signed by:   Bethel Born MD on 09/17/2009   Method used:   Electronically to        [facility]  [address] [phone]* (retail)  [address]       [place], [place]  [address]       Ph: [phone]       Fax: [phone]   RxID:   [id number] HYDROCORTISONE 20 MG TABS (HYDROCORTISONE) Take 1 tablet by mouth once a day  #90 x 2   Entered and Authorized by:   Bethel Born MD   Signed by:   Bethel Born MD on 09/17/2009   Method used:   Electronically to        [facility]  [address] [phone]* (retail)       [address]       [place], [place]  [address]       Ph: [phone]       Fax: [phone]   RxID:   [id number] SYNTHROID 100 MCG TABS (LEVOTHYROXINE SODIUM) Take 1 tablet by mouth once a day  #90 x 2   Entered and Authorized by:   Bethel Born MD   Signed by:   Bethel Born MD on 09/17/2009   Method used:   Electronically to        [facility]  [address] [phone]* (retail)       [address]       [place], [place]  [address]       Ph: [phone]       Fax: [phone]   RxID:   [id number]   Prevention & Chronic Care Immunizations   Influenza vaccine: Fluvax 3+  (06/02/2009)   Influenza vaccine deferral: Deferred  (09/17/2009)   Influenza vaccine due: 04/15/2010    Tetanus booster: Not documented   Td booster deferral: Deferred  (06/02/2009)   Tetanus booster due: 02/18/2019    Pneumococcal vaccine: Not documented   Pneumococcal vaccine deferral: Refused  (09/17/2009)  Colorectal Screening   Hemoccult: Not documented   Hemoccult action/deferral: Refused  (09/17/2009)    Colonoscopy: Not documented   Colonoscopy action/deferral: Refused  (09/17/2009)  Other Screening   PSA: 0.43   (08/25/2006)   PSA action/deferral: Discussed-decision deferred  (06/02/2009)   Smoking status: never  (09/17/2009)  Lipids   Total Cholesterol: Not documented   Lipid panel action/deferral: Lipid Panel ordered   LDL: Not documented   LDL Direct: Not documented   HDL: Not documented   Triglycerides: Not documented  Process Orders Check Orders Results:     [facility]: Check successful Order queued for requisitioning for [facility]: September 17, 2009 1:37 PM  Tests Sent for requisitioning (September 17, 2009 1:37 PM):     09/17/2009: [facility] -- T-Lipid Profile [id number] (signed)   Please disregard last three prescription

## 2010-09-14 NOTE — Assessment & Plan Note (Signed)
Summary: WEIGHT LOSS/DEVANI/DS   Vital Signs:  Patient profile:   50 year old male Height:      68 inches (172.72 cm) Weight:      151.01 pounds (68.64 kg) BMI:     23.04 Temp:     98.4 degrees F (36.89 degrees C) oral Pulse rate:   72 / minute BP sitting:   133 / 89  (right arm) Cuff size:   regular  Vitals Entered By: Angelina Ok RN (June 28, 2010 11:46AM) Is Patient Diabetic? No Pain Assessment Patient in pain? no      Nutritional Status BMI of 19 -24 = normal  Have you ever been in a relationship where you felt threatened, hurt or afraid?No   Does patient need assistance? Functional Status Self care, Cook/clean, Shopping, Social activities Ambulation Normal Comments Needs assistance with some ADL's.  Losing weight. More forgetful.  Appetite is still good.  Slower in moving around.   Primary Care Provider:  Bethel Born MD   History of Present Illness: Pt is a 50 yo AAM with PMH of HTN, mental retardation, adrenal insufficiency who came here for concern of weight loss. His mother came here with him and concerned of his weight loss. His weight usually ranges 155-165. last vist 6 months ago was 155 and he has been eating well, no chest pain, SOB, dizziness, fatigue. No melena or hematochezia. He has been taking his meds a s instructed.   Depression History:      The patient denies a depressed mood most of the day and a diminished interest in his usual daily activities.         Preventive Screening-Counseling & Management  Alcohol-Tobacco     Alcohol drinks/day: 0     Smoking Status: never  Problems Prior to Update: 1)  Hypertension  (ICD-401.9) 2)  Frequency, Urinary  (ICD-788.41) 3)  Preventive Health Care  (ICD-V70.0) 4)  Cardiac Murmur, Systolic  (ICD-785.2) 5)  Adrenal Insufficiency, Hx of  (ICD-V13.8) 6)  Mental Retardation  (ICD-319) 7)  Hypothyroidism  (ICD-244.9)  Medications Prior to Update: 1)  Synthroid 100 Mcg Tabs (Levothyroxine Sodium)  .... Take 1 Tablet By Mouth Once A Day 2)  Hydrocortisone 20 Mg Tabs (Hydrocortisone) .... Take 1 Tablet By Mouth Once A Day 3)  Fludrocortisone Acetate 0.1 Mg Tabs (Fludrocortisone Acetate) .... Take 1/2 Tablet By Mouth Once A Day 4)  Multivitamins  Tabs (Multiple Vitamin) .... Take 1 Tablet By Mouth Once A Day  Current Medications (verified): 1)  Synthroid 100 Mcg Tabs (Levothyroxine Sodium) .... Take 1 Tablet By Mouth Once A Day 2)  Hydrocortisone 20 Mg Tabs (Hydrocortisone) .... Take 1 Tablet By Mouth Once A Day 3)  Fludrocortisone Acetate 0.1 Mg Tabs (Fludrocortisone Acetate) .... Take 1/2 Tablet By Mouth Once A Day 4)  Multivitamins  Tabs (Multiple Vitamin) .... 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 3 uncles with prostate cancer.  Social History: Last updated: 03/12/2008 Does not smoke, do drugs, or drink EtOH.  Risk Factors: Smoking Status: never (06/28/2010)  Family History: Reviewed history from 01/14/2009 and no changes required. Mother has DM2, h/o breast cancer, & HTN. 3 uncles  with prostate cancer.  Social History: Reviewed history from 03/12/2008 and no changes required. Does not smoke, do drugs, or drink EtOH.  Review of Systems       The patient complains of weight loss.  The patient denies anorexia, fever, decreased hearing, hoarseness, chest pain, syncope, dyspnea on exertion, peripheral edema, prolonged cough, hemoptysis, abdominal pain, melena, and hematochezia.    Physical Exam  General:  alert, well-developed, well-nourished, well-hydrated, appropriate dress, normal  appearance, and healthy-appearing.   Head:  normocephalic.   Nose:  no nasal discharge.   Mouth:  pharynx pink and moist.   Neck:  supple.   Lungs:  normal respiratory effort, no accessory muscle use, normal breath sounds, no crackles, and no wheezes.   Heart:  normal rate, regular rhythm, no murmur, and no JVD.   Abdomen:  soft, non-tender, normal bowel sounds, no distention, and no masses.   Msk:  normal ROM, no joint tenderness, no joint swelling, and no joint warmth.   Pulses:  2+ Extremities:  no edema.  Neurologic:  alert & oriented X3, cranial nerves II-XII intact, strength normal in all extremities, sensation intact to light touch, gait normal, and DTRs symmetrical and normal.     Impression & Recommendations:  Problem # 1:  WEIGHT LOSS (ICD-783.21) Assessment New He lost about 4 lbs in the past 6 months. No active GI bleeding sign or fatigue. His 2 uncle has prostate cancer, the mother is worried cancer. Since weight loss is not very dramatic and PE did not show any cachexia. I told them the cancer is less likely, but needs to r/u, so will check stool card to r/u any Gi malignancy and PSA for prostate cancer.  His adrenal insufficieny and hypothyroidism may also contribute to this. Will recheck his weight at next visit.  Orders: T-CBC No Diff ([id number])  Problem # 2:  HYPERTENSION (ICD-401.9) Assessment: Unchanged Well controlled w/o any meds. So continue to observe.  BP today: 133/89 Prior BP: 133/84 (01/04/2010)  Labs Reviewed: K+: 4.1 (01/04/2010) Creat: : 0.99 (01/04/2010)   Chol: 146 (09/17/2009)   HDL: 51 (09/17/2009)   LDL: 85 (09/17/2009)   TG: 52 (09/17/2009)  Problem # 3:  HYPOTHYROIDISM (ICD-244.9) Assessment: Unchanged Will continue synthroid.  His updated medication list for this problem includes:    Synthroid 100 Mcg Tabs (Levothyroxine sodium) .Marland Kitchen... Take 1 tablet by mouth once a day  Labs Reviewed: TSH: 0.708 (12/18/2009)    HgBA1c: 5.4  (12/18/2009) Chol: 146 (09/17/2009)   HDL: 51 (09/17/2009)   LDL: 85 (09/17/2009)   TG: 52 (09/17/2009)  Problem # 4:  ADRENAL INSUFFICIENCY, HX OF (ICD-V13.8) Assessment: Unchanged Will continue to take his steroids. No anusea.  Complete Medication List: 1)  Synthroid 100 Mcg Tabs (Levothyroxine sodium) .... Take 1 tablet by mouth once a day 2)  Hydrocortisone 20 Mg Tabs (Hydrocortisone) .... Take 1 tablet by mouth once a day 3)  Fludrocortisone Acetate 0.1 Mg Tabs (Fludrocortisone acetate) .... Take 1/2 tablet by mouth once a day 4)  Multivitamins Tabs (Multiple vitamin) .... Take 1 tablet by mouth once a day  Other Orders: T-PSA Total ([id number]) T-Hemoccult Card-Multiple (take home) (99371)  Patient Instructions: 1)  Please schedule a follow-up appointment in 1 month. 2)  Will call you if any abnormal lab results.   Orders Added: 1)  T-PSA Total [[id number]] 2)  T-Hemoccult Card-Multiple (take home) [[id number]] 3)  T-CBC No Diff [[id number]] 4)  Est. Patient Level IV [01751]    Prevention &  Chronic Care Immunizations   Influenza vaccine: Fluvax 3+  (06/02/2009)   Influenza vaccine deferral: Deferred  (09/17/2009)   Influenza vaccine due: 04/15/2010    Tetanus booster: Not documented   Td booster deferral: Deferred  (06/02/2009)   Tetanus booster due: 02/18/2019    Pneumococcal vaccine: Not documented   Pneumococcal vaccine deferral: Refused  (09/17/2009)  Colorectal Screening   Hemoccult: Not documented   Hemoccult action/deferral: Ordered  (06/28/2010)    Colonoscopy: Not documented   Colonoscopy action/deferral: Refused  (12/18/2009)  Other Screening   PSA: 0.61  (01/04/2010)   PSA ordered.   PSA action/deferral: Discussed-PSA requested  (06/28/2010)   Smoking status: never  (06/28/2010)  Lipids   Total Cholesterol: 146  (09/17/2009)   Lipid panel action/deferral: Lipid Panel ordered   LDL: 85  (09/17/2009)   LDL Direct: Not documented   HDL: 51   (09/17/2009)   Triglycerides: 52  (09/17/2009)  Hypertension   Last Blood Pressure: 133 / 89  (06/28/2010)   Serum creatinine: 0.99  (01/04/2010)   Serum potassium 4.1  (01/04/2010)    Hypertension flowsheet reviewed?: Yes   Progress toward BP goal: Unchanged  Self-Management Support :    Patient will work on the following items until the next clinic visit to reach self-care goals:     Medications and monitoring: take my medicines every day, bring all of my medications to every visit  (06/28/2010)     Eating: drink diet soda or water instead of juice or soda, eat more vegetables, use fresh or frozen vegetables, eat foods that are low in salt, eat baked foods instead of fried foods, eat fruit for snacks and desserts, limit or avoid alcohol  (06/28/2010)     Activity: take a 30 minute walk every day  (06/28/2010)    Hypertension self-management support: Written self-care plan, Education handout, Pre-printed educational material, Resources for patients handout  (06/28/2010)   Hypertension self-care plan printed.   Hypertension education handout printed      Resource handout printed.   Nursing Instructions: Give Flu vaccine today Provide Hemoccult cards with instructions (see order)    Process Orders Check Orders Results:     [facility]: Order checked:     [id number] -- T-PSA Total -- ABN required due to diagnosis (CPT: [phone]) Tests Sent for requisitioning (June 28, 2010 8:56 PM):     06/28/2010: [facility] -- T-PSA Total [[id number]] (signed)     06/28/2010: [facility] -- T-CBC No Diff [[id number]] (signed)    Appended Document: WEIGHT LOSS/DEVANI/DS   Immunizations Administered:  Influenza Vaccine # 1:    Vaccine Type: Fluvax MCR    Site: left deltoid    Mfr: GlaxoSmithKline    Dose: 0.5 ml    Route: IM    Given by: Angelina Ok RN    Exp. Date: 02/12/2011    Lot #: [id number]    VIS given: 03/09/10 version given  June 29, 2010.  Flu Vaccine Consent Questions:    Do you have a history of severe allergic reactions to this vaccine? no    Any prior history of allergic reactions to egg and/or gelatin? no    Do you have a sensitivity to the preservative Thimersol? no    Do you have a past history of Guillan-Barre Syndrome? no    Do you currently have an acute febrile illness? no    Have you ever had a severe reaction to latex? no    Vaccine information given  and explained to patient? yes

## 2010-09-27 ENCOUNTER — Encounter: Payer: Self-pay | Admitting: Internal Medicine

## 2010-09-28 ENCOUNTER — Other Ambulatory Visit: Payer: Self-pay | Admitting: *Deleted

## 2010-09-28 MED ORDER — FLUDROCORTISONE ACETATE 0.1 MG PO TABS: 0.0500 mg | ORAL_TABLET | Freq: Every day | ORAL | Status: DC

## 2010-10-11 ENCOUNTER — Ambulatory Visit (INDEPENDENT_AMBULATORY_CARE_PROVIDER_SITE_OTHER): Payer: Medicare Other | Admitting: Internal Medicine

## 2010-10-11 DIAGNOSIS — I10 Essential (primary) hypertension: Secondary | ICD-10-CM

## 2010-10-11 DIAGNOSIS — R634 Abnormal weight loss: Secondary | ICD-10-CM

## 2010-10-11 DIAGNOSIS — E039 Hypothyroidism, unspecified: Secondary | ICD-10-CM

## 2010-10-11 DIAGNOSIS — Z87898 Personal history of other specified conditions: Secondary | ICD-10-CM

## 2010-10-11 NOTE — Assessment & Plan Note (Signed)
Stable. Continue steroids.

## 2010-10-11 NOTE — Patient Instructions (Signed)
Please keep your skin moisturized with lotion or Vaseline. This will help with the itching. Let your skin get some air also. Please continue to take your medications. Let us know if you have any questions or concerns.

## 2010-10-11 NOTE — Assessment & Plan Note (Signed)
Resolved. Patient denies any interval history fatigue or weakness.  Again  PSA, CBC and stool Hemoccult cards were all negative.  Patient has been able to improve his diet and has gained almost 7-8ibs pounds since his last visit.  No other concerns today by the patient or the mother.

## 2010-10-11 NOTE — Progress Notes (Signed)
  Subjective:    Patient ID: Darren Mcbride, male    DOB: Dec 22, 1960, 50 y.o.   MRN: [id number]  HPI Patient is a 50 year old  Man with a history of hypertension, adrenal insufficiency and mental retardation. He is here accompanied with his mother on follow up on his weight loss.  Patient was seen and evaluated for weight loss back in November at which time the mother was concerned about possible malignancy ( patient's mother  Was concerned about prostate cancer at that time because she has family members who passed away from prostate cancer ).  Workup to evaluate for this included Hemoccult stool cards, CBC and PSA and were all within normal limits.  Today the patient's mother states to me that she actually has no concerns.  She states that Darren Mcbride has been eating regularly and has gained weight since his last office visit ( he has gained about 7 pounds since his November visit).    She also said that Darren Mcbride has been recently complaining of an itch on his legs.  There is no rash on his legs however they appear very dry and covered by about 3 layers tight clothing that includes damp socks, thermal underwear, booties and tight track pants.  For this he was instructed to keep his skin moisturized especially during the winter months and to wear loose dry clothing.   Review of Systems  Constitutional: Negative for fever and chills.  Respiratory: Negative for shortness of breath.   Cardiovascular: Negative for chest pain and palpitations.  Gastrointestinal: Negative for nausea and vomiting.  Genitourinary: Negative for dysuria.  Skin: Negative for rash.  Neurological: Negative for weakness.       Objective:   Physical Exam  Constitutional: He is oriented to person, place, and time. He appears well-developed and well-nourished. No distress.  Cardiovascular: Normal rate, regular rhythm and normal heart sounds.  Exam reveals no gallop and no friction rub.   No murmur heard. Pulmonary/Chest: Effort  normal and breath sounds normal. No respiratory distress. He has no wheezes. He has no rales.  Abdominal: Soft. Bowel sounds are normal. There is no tenderness.  Musculoskeletal: Normal range of motion. He exhibits no edema.  Neurological: He is alert and oriented to person, place, and time.  Skin: Skin is dry. No rash noted.  Psychiatric: He has a normal mood and affect.          Assessment & Plan:

## 2010-10-11 NOTE — Assessment & Plan Note (Addendum)
This is stable and patientc is currently asymptomatic on 100 mcg of Synthroid daily.  Last TSH was checked in May 2011 and was 0.7 this a free T4 of 1.18.  Just recently restarted Synthroid. Will need to check TSH at next visit to determine if he's on the correct dose. Continue to follow.

## 2010-10-11 NOTE — Assessment & Plan Note (Addendum)
Very well controlled  at 121/80 today without any medications.  Basic metabolic panel checked in May 2011 was within normal limits.  Continue to follow.

## 2010-10-11 NOTE — Assessment & Plan Note (Addendum)
Resolved. Denies any interval history of fatigue or weakness.  Again PSA, CBC, and Hemoccult cards all negative.  Has been able to gain 7 to 8 pounds since his last visit after resuming a regular diet  and he has been eating very well per the mother since her last visit.

## 2011-02-23 ENCOUNTER — Ambulatory Visit (INDEPENDENT_AMBULATORY_CARE_PROVIDER_SITE_OTHER): Payer: Medicare Other | Admitting: Internal Medicine

## 2011-02-23 ENCOUNTER — Encounter: Payer: Self-pay | Admitting: Gastroenterology

## 2011-02-23 ENCOUNTER — Encounter: Payer: Self-pay | Admitting: Internal Medicine

## 2011-02-23 VITALS — BP 135/87 | HR 70 | Temp 97.1°F | Ht 69.0 in | Wt 159.6 lb

## 2011-02-23 DIAGNOSIS — F09 Unspecified mental disorder due to known physiological condition: Secondary | ICD-10-CM

## 2011-02-23 DIAGNOSIS — Z23 Encounter for immunization: Secondary | ICD-10-CM

## 2011-02-23 DIAGNOSIS — R413 Other amnesia: Secondary | ICD-10-CM

## 2011-02-23 DIAGNOSIS — E039 Hypothyroidism, unspecified: Secondary | ICD-10-CM

## 2011-02-23 DIAGNOSIS — R4189 Other symptoms and signs involving cognitive functions and awareness: Secondary | ICD-10-CM

## 2011-02-23 DIAGNOSIS — Z0000 Encounter for general adult medical examination without abnormal findings: Secondary | ICD-10-CM

## 2011-02-23 DIAGNOSIS — I10 Essential (primary) hypertension: Secondary | ICD-10-CM

## 2011-02-23 LAB — CBC WITH DIFFERENTIAL/PLATELET
Basophils Absolute: 0 10*3/uL (ref 0.0–0.1)
HCT: 41.1 % (ref 39.0–52.0)
Lymphocytes Relative: 35 % (ref 12–46)
Monocytes Absolute: 0.4 10*3/uL (ref 0.1–1.0)
Neutro Abs: 3.4 10*3/uL (ref 1.7–7.7)
Neutrophils Relative %: 56 % (ref 43–77)
Platelets: 117 10*3/uL — ABNORMAL LOW (ref 150–400)
RDW: 14.6 % (ref 11.5–15.5)
WBC: 6.1 10*3/uL (ref 4.0–10.5)

## 2011-02-23 LAB — VITAMIN B12: Vitamin B-12: 372 pg/mL (ref 211–911)

## 2011-02-23 NOTE — Patient Instructions (Addendum)
We are checking several lab values today that may have affected his memory or coordination lately, including a thyroid level, a blood count, electrolyte levels, and a vitamin B12 level.  We will call you if any of the results are abnormal.  **Please schedule an appointment with your eye doctor for a vision check and to evaluate your right eye**  You have received the Tetanus vaccine today.  Please schedule a routine screening colonoscopy.  Please return for a follow-up visit in about 3 months, or sooner if the patient is having any new symptoms.

## 2011-02-23 NOTE — Assessment & Plan Note (Signed)
Patient stopped taking his synthroid for 6 months, but re-started taking medication 3 months ago.  Currently asymptomatic. -will check TSH/T4 today, and adjust synthroid as necessary

## 2011-02-23 NOTE — Assessment & Plan Note (Signed)
Patient's mother notes a 6-9 month history of short-term memory loss, which may have a component of inattention, and a complaint of recent clumsiness.  Symptoms seem to have coincided with patient deciding to discontinue his synthroid. -will check a broad range of labs to evaluate for any abnormalities, including TSH/T4, CMP, CBC, and vit B12 -will schedule a yearly eye exam, for the possibility that poor vision is contributing to clumsiness -symptoms seem mild at present, but if they persist or worsen, may consider brain imaging.

## 2011-02-23 NOTE — Progress Notes (Signed)
Follow-up Visit, Intern Note  HPI: The patient is a 50 yo man with a history of cognitive delay, HTN, adrenal insufficiency, and hypothyroidism, presenting with recent cognitive changes.  The patient's mother notes that for the past 6-9 months, the patient has had occasional difficulty with short-term memory, for example his mother will tell him something, then 5 minutes later he won't remember what she said.  She isn't sure if he is not paying attention, or truly having a problem with memory.  She also notes increased clumsiness during this time period, for example pulling a cabinet door off its hinges by accident when trying to get a snack.  She states that he shows no violent tendencies, and that these events just appear to be accidents, though they have been happening more often.  She notes that he has no confusion or lethargy, and he is still able to "answer every question in 'Are You Smarter Than A 5th Grader' correctly", reflecting intact cognition.  The patient does not report blurry vision, but his mother notes that he has not had an eye exam in over 1 year.  A right eye strabismus is noted, and the mother is unsure if this is a new or old finding.  The patient does not know why these events have been happening, and believes they are just accidents.  He notes no fevers/chills, nausea/vomiting, diarrhea/constipation, anorexia, abdominal pain, weakness, or hot/cold intolerance.    ROS: General: no fevers, chills, changes in weight, changes in appetite Skin: no rash HEENT: no blurry vision, hearing changes, sore throat Pulm: no dyspnea, coughing, wheezing CV: no chest pain, palpitations, shortness of breath Abd: no abdominal pain, nausea/vomiting, diarrhea/constipation Ext: no arthralgias, myalgias Neuro: no weakness, numbness, or tingling  Filed Vitals:   02/23/11 1031  BP: 135/87  Pulse: 70  Temp: 97.1 F (36.2 C)    PEX General: cooperative, able to answer all questions, and in no  apparent distress HEENT: Right eye exotropia noted, which becomes less apparent when patient focuses on a single point.  Pupils equal round and reactive to light, visual fields intact, vision grossly intact  Neck: supple, no lymphadenopathy, JVD Lungs: clear to ascultation bilaterally, normal work of respiration, no wheezes, rales, ronchi Heart: regular rate and rhythm, no murmurs, gallops, or rubs Abdomen: soft, non-tender, non-distended, normal bowel sounds Msk: no joint edema, warmth, or erythema Extremities: no cyanosis, clubbing, or edema Neurologic: alert & oriented X3, cranial nerves II-XII grossly intact, MMSE score 29/30 (-1 for drawing intersecting pentagons) strength grossly intact, sensation intact to light touch, gait normal  Assessment/Plan

## 2011-02-23 NOTE — Assessment & Plan Note (Signed)
Patient reports a previous diagnosis of autism, but shows cognitive abilities (MMSE = 29, ability to take daily medications and navigate the city bus system independently, etc.) more consistent with cognitive delay rather than true autism.

## 2011-02-23 NOTE — Assessment & Plan Note (Signed)
BP well-controlled today without medication, will continue to monitor.

## 2011-02-24 LAB — COMPLETE METABOLIC PANEL WITH GFR
Albumin: 4.6 g/dL (ref 3.5–5.2)
Alkaline Phosphatase: 59 U/L (ref 39–117)
BUN: 11 mg/dL (ref 6–23)
GFR, Est African American: >60 mL/min (ref >60)
GFR, Est Non African American: >60 mL/min (ref >60)
Glucose, Bld: 81 mg/dL (ref 70–99)
Potassium: 4 mEq/L (ref 3.5–5.3)

## 2011-03-16 ENCOUNTER — Ambulatory Visit (AMBULATORY_SURGERY_CENTER): Payer: Medicare Other | Admitting: *Deleted

## 2011-03-16 VITALS — Ht 69.0 in | Wt 155.0 lb

## 2011-03-16 DIAGNOSIS — Z1211 Encounter for screening for malignant neoplasm of colon: Secondary | ICD-10-CM

## 2011-03-16 MED ORDER — PEG-KCL-NACL-NASULF-NA ASC-C 100 G PO SOLR: ORAL | Status: DC

## 2011-03-30 ENCOUNTER — Ambulatory Visit (AMBULATORY_SURGERY_CENTER): Payer: Medicare Other | Admitting: Gastroenterology

## 2011-03-30 ENCOUNTER — Encounter: Payer: Self-pay | Admitting: Gastroenterology

## 2011-03-30 VITALS — BP 137/81 | HR 82 | Temp 97.8°F | Resp 18

## 2011-03-30 DIAGNOSIS — D1330 Benign neoplasm of unspecified part of small intestine: Secondary | ICD-10-CM

## 2011-03-30 DIAGNOSIS — Z1211 Encounter for screening for malignant neoplasm of colon: Secondary | ICD-10-CM

## 2011-03-30 DIAGNOSIS — D126 Benign neoplasm of colon, unspecified: Secondary | ICD-10-CM

## 2011-03-30 MED ORDER — SODIUM CHLORIDE 0.9 % IV SOLN: 500.0000 mL | INTRAVENOUS | Status: DC

## 2011-03-30 NOTE — Patient Instructions (Signed)
Discharge instructions given with verbal understanding. Handout on polyp given. Resume previous medications.

## 2011-03-31 ENCOUNTER — Telehealth: Payer: Self-pay | Admitting: *Deleted

## 2011-03-31 NOTE — Telephone Encounter (Signed)
Follow up Call- Patient questions:  Do you have a fever, pain , or abdominal swelling? no Pain Score  0 *  Have you tolerated food without any problems? yes  Have you been able to return to your normal activities? yes  Do you have any questions about your discharge instructions: Diet   no Medications  no Follow up visit  no  Do you have questions or concerns about your Care? no  Actions: * If pain score is 4 or above: No action needed, pain <4. Spoke with patients mother who states he has gone to school this am but he did very well after his procedure. Has eaten and has no pain or issues. EWM

## 2011-04-13 ENCOUNTER — Telehealth: Payer: Self-pay

## 2011-04-13 DIAGNOSIS — Z86010 Personal history of colonic polyps: Secondary | ICD-10-CM

## 2011-04-13 NOTE — Telephone Encounter (Signed)
Pt aware of previsit and colon previst letter mailed

## 2011-05-09 ENCOUNTER — Other Ambulatory Visit: Payer: Self-pay | Admitting: Internal Medicine

## 2011-05-10 ENCOUNTER — Encounter: Payer: Self-pay | Admitting: Internal Medicine

## 2011-05-30 ENCOUNTER — Ambulatory Visit: Payer: Medicare Other | Admitting: *Deleted

## 2011-05-30 VITALS — Ht 69.0 in | Wt 158.5 lb

## 2011-05-30 DIAGNOSIS — D126 Benign neoplasm of colon, unspecified: Secondary | ICD-10-CM

## 2011-05-30 MED ORDER — PEG-KCL-NACL-NASULF-NA ASC-C 100 G PO SOLR: ORAL | Status: DC

## 2011-06-02 ENCOUNTER — Ambulatory Visit (HOSPITAL_COMMUNITY)
Admission: RE | Admit: 2011-06-02 | Discharge: 2011-06-02 | Disposition: A | Discharge status: Home / Self Care | Payer: Medicare Other | Source: Ambulatory Visit | Attending: Gastroenterology | Admitting: Gastroenterology

## 2011-06-02 ENCOUNTER — Other Ambulatory Visit: Payer: Medicare Other | Admitting: Gastroenterology

## 2011-06-02 ENCOUNTER — Other Ambulatory Visit: Payer: Self-pay | Admitting: Gastroenterology

## 2011-06-02 DIAGNOSIS — Z1211 Encounter for screening for malignant neoplasm of colon: Secondary | ICD-10-CM

## 2011-06-02 DIAGNOSIS — D126 Benign neoplasm of colon, unspecified: Secondary | ICD-10-CM | POA: Insufficient documentation

## 2011-06-02 DIAGNOSIS — F840 Autistic disorder: Secondary | ICD-10-CM | POA: Insufficient documentation

## 2011-06-02 DIAGNOSIS — Z86010 Personal history of colonic polyps: Secondary | ICD-10-CM

## 2011-06-02 DIAGNOSIS — Z79899 Other long term (current) drug therapy: Secondary | ICD-10-CM | POA: Insufficient documentation

## 2011-06-02 HISTORY — DX: Benign neoplasm of colon, unspecified: D12.6

## 2011-06-11 ENCOUNTER — Other Ambulatory Visit: Payer: Self-pay | Admitting: Internal Medicine

## 2011-09-22 ENCOUNTER — Ambulatory Visit (INDEPENDENT_AMBULATORY_CARE_PROVIDER_SITE_OTHER): Payer: Medicare Other | Admitting: Internal Medicine

## 2011-09-22 ENCOUNTER — Encounter: Payer: Self-pay | Admitting: Internal Medicine

## 2011-09-22 DIAGNOSIS — F840 Autistic disorder: Secondary | ICD-10-CM

## 2011-09-22 DIAGNOSIS — E2740 Unspecified adrenocortical insufficiency: Secondary | ICD-10-CM

## 2011-09-22 DIAGNOSIS — E2749 Other adrenocortical insufficiency: Secondary | ICD-10-CM

## 2011-09-22 DIAGNOSIS — E039 Hypothyroidism, unspecified: Secondary | ICD-10-CM

## 2011-09-22 DIAGNOSIS — I10 Essential (primary) hypertension: Secondary | ICD-10-CM

## 2011-09-22 HISTORY — DX: Autistic disorder: F84.0

## 2011-09-22 HISTORY — DX: Unspecified adrenocortical insufficiency: E27.40

## 2011-09-22 NOTE — Patient Instructions (Signed)
Follow up in 6-8 months Get in touch with the [facility] autism care center for further management.

## 2011-09-22 NOTE — Assessment & Plan Note (Signed)
Continue Florinef and steroids. Check serum chemistry next time.

## 2011-09-22 NOTE — Progress Notes (Signed)
Patient ID: Darren Mcbride, male   DOB: [birth year], 51 y.o.   MRN: [id number]  51 year old man with past medical history hypertension, hypothyroidism, adrenal insufficiency and autism comes to the clinic for follow up. His mother is with him today. She states that his autism has been getting worse lately. He has not been  very interactive socially and has not been doing much activity except watching TV all day. His memory has been declining lately. He has been having problems with vision and has things falling out of his hand more frequently. Patient is able to take care of his ADLs and goes to school. He is not in any distress and does not speak to me. He does not maintain eye contact. He answers my questions in yes or no and one to two words He denies any pain. He is compliant with his medications.  Physical exam  General Appearance:     Filed Vitals:   09/22/11 0851  BP: 137/79  Pulse: 82  Temp: 96.8 F (36 C)  TempSrc: Oral  Height: 5\' 9"  (1.753 m)  Weight: 161 lb 11.2 oz (73.347 kg)  SpO2: 99%     Alert, cooperative, no distress, 1-2 word answers. No eye contact.   Head:    Normocephalic, without obvious abnormality, atraumatic  Eyes:    PERRL, conjunctiva/corneas clear, EOM's intact, fundi    benign, both eyes       Neck:   Supple, symmetrical, trachea midline, no adenopathy;       thyroid:  No enlargement/tenderness/nodules; no carotid   bruit or JVD  Lungs:     Clear to auscultation bilaterally, respirations unlabored  Chest wall:    No tenderness or deformity  Heart:    Regular rate and rhythm, S1 and S2 normal, no murmur, rub   or gallop  Abdomen:     Soft, non-tender, bowel sounds active all four quadrants,    no masses, no organomegaly  Extremities:   Extremities normal, atraumatic, no cyanosis or edema  Pulses:   2+ and symmetric all extremities  Skin:   Skin color, texture, turgor normal, no rashes or lesions  Neurologic:  nonfocal grossly   review of  system  Constitutional: Denies fever, chills, diaphoresis, appetite change and fatigue.  HEENT: Denies photophobia, eye pain, redness, hearing loss, ear pain, congestion, sore throat, rhinorrhea, sneezing, mouth sores, trouble swallowing, neck pain, neck stiffness and tinnitus.   Respiratory: Denies SOB, DOE, cough, chest tightness,  and wheezing.   Cardiovascular: Denies chest pain, palpitations and leg swelling.  Gastrointestinal: Denies nausea, vomiting, abdominal pain, diarrhea, constipation, blood in stool and abdominal distention.  Genitourinary: Denies dysuria, urgency, frequency, hematuria, flank pain and difficulty urinating.  Musculoskeletal: Denies myalgias, back pain, joint swelling, arthralgias and gait problem.  Skin: Denies pallor, rash and wound.  Neurological: Denies dizziness, seizures, syncope, weakness, light-headedness, numbness and headaches.  Hematological: Denies adenopathy. Easy bruising, personal or family bleeding history  Psychiatric/Behavioral: Denies suicidal ideation, mood changes, confusion, nervousness, sleep disturbance and agitation

## 2011-09-22 NOTE — Assessment & Plan Note (Signed)
Check TSH at next visit

## 2011-09-22 NOTE — Assessment & Plan Note (Signed)
Patient was diagnosed many years ago by a psychiatrist. He follows up with behavioral health. They stated they cannot treat him at this time because he is not severely depressed or manic as per mother. He does not have good psychiatry or psychology followup for his autism. He has not been able to get to go to community resources and social groups for his autism. After researchin for some time I found [facility] on [address] I have called them and they have agreed to see him. Given this information to the mother. She will call them for an appointment. As per my phone conversation with the clinic, did do a complete medical and psychiatric has care for autism patients. I am not ordering any tests for thyroid or adrenal at this time. I will wait for them to go and visit the clinic first. I called the patient again after 1-2 months. At that time and assess the progress of this autism clinic. Recheck TSH and serum chemistry for adrenal insufficiency at that

## 2011-09-22 NOTE — Assessment & Plan Note (Signed)
Been controlled.

## 2011-11-02 ENCOUNTER — Encounter: Payer: Self-pay | Admitting: Gastroenterology

## 2011-12-08 ENCOUNTER — Other Ambulatory Visit: Payer: Self-pay | Admitting: Family Medicine

## 2012-02-07 ENCOUNTER — Ambulatory Visit (INDEPENDENT_AMBULATORY_CARE_PROVIDER_SITE_OTHER): Payer: Medicare Other | Admitting: Internal Medicine

## 2012-02-07 ENCOUNTER — Encounter: Payer: Self-pay | Admitting: Internal Medicine

## 2012-02-07 VITALS — BP 140/89 | HR 56 | Temp 96.7°F | Ht 69.3 in | Wt 154.1 lb

## 2012-02-07 DIAGNOSIS — E2740 Unspecified adrenocortical insufficiency: Secondary | ICD-10-CM

## 2012-02-07 DIAGNOSIS — F840 Autistic disorder: Secondary | ICD-10-CM

## 2012-02-07 DIAGNOSIS — I10 Essential (primary) hypertension: Secondary | ICD-10-CM

## 2012-02-07 DIAGNOSIS — E039 Hypothyroidism, unspecified: Secondary | ICD-10-CM

## 2012-02-07 DIAGNOSIS — E2749 Other adrenocortical insufficiency: Secondary | ICD-10-CM

## 2012-02-07 NOTE — Assessment & Plan Note (Signed)
Acceptable. Check chemistry

## 2012-02-07 NOTE — Assessment & Plan Note (Signed)
Patient's mother never took him to the autsim centre that we worked so hard to get him into last visit. She request psych referral instead. I am not sure what different strategy as psychiatrist will aodpt but may be they have information regarding other resources and centres that he can go to. Will put in a referral order. No other change. As mentioned in HPI, to me the patient appears better than usual with more communciation, long answers to my questions positive outlook. Continue to follow. Check TSH and chem since mother is complaining that the patient is sleeping more than usual.

## 2012-02-07 NOTE — Patient Instructions (Signed)
Try to get in touch with the autism center that we discussed during last visit. They would be able to provide the best information for Darren Mcbride condition

## 2012-02-07 NOTE — Progress Notes (Signed)
Patient ID: Darren Mcbride, male   DOB: 02/24/1961, 51 y.o.   MRN: [id number] Patient ID: Darren Mcbride, male   DOB: May 20, 1961, 51 y.o.   MRN: [id number]  51 year old man with past medical history hypertension, hypothyroidism, adrenal insufficiency and autism comes to the clinic for follow up. His mother is with him today. AS discussed last time, she states that his autism has been getting worse lately.To me patient's communication, interaction seems unchanged or somewhat better today since he is asking more questions and curious about his health.  Patient is able to take care of his ADLs and goes to school. He is not in any distress and does not speak to me. He does not maintain eye contact. He denies any pain. He is compliant with his medications.  Physical exam  General Appearance:     Filed Vitals:   02/07/12 1317  BP: 140/89  Pulse: 56  Temp: 96.7 F (35.9 C)  TempSrc: Oral  Height: 5' 9.3" (1.76 m)  Weight: 154 lb 1.6 oz (69.899 kg)  SpO2: 98%     Alert, cooperative, no distress, 1-2 word answers. No eye contact.   Head:    Normocephalic, without obvious abnormality, atraumatic  Eyes:    PERRL, conjunctiva/corneas clear, EOM's intact, fundi    benign, both eyes       Neck:   Supple, symmetrical, trachea midline, no adenopathy;       thyroid:  No enlargement/tenderness/nodules; no carotid   bruit or JVD  Lungs:     Clear to auscultation bilaterally, respirations unlabored  Chest wall:    No tenderness or deformity  Heart:    Regular rate and rhythm, S1 and S2 normal, no murmur, rub   or gallop  Abdomen:     Soft, non-tender, bowel sounds active all four quadrants,    no masses, no organomegaly  Extremities:   Extremities normal, atraumatic, no cyanosis or edema. varicose veins on left LE  Pulses:   2+ and symmetric all extremities  Skin:   Skin color, texture, turgor normal, no rashes or lesions  Neurologic:  nonfocal grossly   review of system  Constitutional: Denies  fever, chills, diaphoresis, appetite change and fatigue.  HEENT: Denies photophobia, eye pain, redness, hearing loss, ear pain, congestion, sore throat, rhinorrhea, sneezing, mouth sores, trouble swallowing, neck pain, neck stiffness and tinnitus.   Respiratory: Denies SOB, DOE, cough, chest tightness,  and wheezing.   Cardiovascular: Denies chest pain, palpitations and leg swelling.  Gastrointestinal: Denies nausea, vomiting, abdominal pain, diarrhea, constipation, blood in stool and abdominal distention.  Genitourinary: Denies dysuria, urgency, frequency, hematuria, flank pain and difficulty urinating.  Musculoskeletal: Denies myalgias, back pain, joint swelling, arthralgias and gait problem.  Skin: Denies pallor, rash and wound.  Neurological: Denies dizziness, seizures, syncope, weakness, light-headedness, numbness and headaches.  Hematological: Denies adenopathy. Easy bruising, personal or family bleeding history  Psychiatric/Behavioral: Denies suicidal ideation, mood changes, confusion, nervousness, sleep disturbance and agitation

## 2012-02-07 NOTE — Assessment & Plan Note (Signed)
Check TSH. Continue synthorid at current dose

## 2012-02-07 NOTE — Assessment & Plan Note (Signed)
Will need to assess if patient is understands the gravity of this problems and is able to take his medications by himself in case the mother who is 43 now gets sick or is unavailable. Missed this conversations today but should be pursued at next visit.

## 2012-02-08 ENCOUNTER — Other Ambulatory Visit: Payer: Self-pay | Admitting: Internal Medicine

## 2012-02-08 DIAGNOSIS — E039 Hypothyroidism, unspecified: Secondary | ICD-10-CM

## 2012-02-08 LAB — BASIC METABOLIC PANEL
CO2: 31 mEq/L (ref 19–32)
Calcium: 9.7 mg/dL (ref 8.4–10.5)
Creat: 1.06 mg/dL (ref 0.50–1.35)
Glucose, Bld: 80 mg/dL (ref 70–99)

## 2012-02-08 NOTE — Progress Notes (Signed)
See note with lab

## 2012-02-20 ENCOUNTER — Other Ambulatory Visit (INDEPENDENT_AMBULATORY_CARE_PROVIDER_SITE_OTHER): Payer: Medicare Other

## 2012-02-20 DIAGNOSIS — E039 Hypothyroidism, unspecified: Secondary | ICD-10-CM

## 2012-02-21 ENCOUNTER — Other Ambulatory Visit: Payer: Self-pay | Admitting: Internal Medicine

## 2012-02-21 DIAGNOSIS — E039 Hypothyroidism, unspecified: Secondary | ICD-10-CM

## 2012-02-21 MED ORDER — LEVOTHYROXINE SODIUM 88 MCG PO TABS: 88.0000 ug | ORAL_TABLET | Freq: Every day | ORAL | Status: DC

## 2012-02-21 NOTE — Progress Notes (Signed)
TSH low x 2. Please ask pt to throw away 100 mcg synthroid and take the new 88 mcg. Repeat TSH 6 weeks.

## 2012-02-22 ENCOUNTER — Telehealth: Payer: Self-pay | Admitting: Licensed Clinical Social Worker

## 2012-02-22 NOTE — Telephone Encounter (Signed)
Mr. Duffner was referred to CSW for referral to psychiatry.  Mr. Cotten has medicare/medicaid.  CSW placed call to pt.  Pt's mother answered telephone stating pt was a school, "you need to speak with me if it's medical, I'm his momma".  Pt's mother requesting CSW to schedule psychiatry appt and call make with date and time, preferring no Tuesday appts.  CSW placed call to [facility] provided information, awaiting return call for scheduling.

## 2012-02-22 NOTE — Progress Notes (Signed)
Talked with mother about Dr Robyn Haber note. Stated she understood what I was explaining to her. Will call and sch appt.

## 2012-02-24 NOTE — Telephone Encounter (Signed)
CSW has yet to receive appt date and time for Mr. Lehrmann.  CSW placed call to [facility] [place], left message requesting psychiatry appt for Mr. Volker, any day of the week except Tuesdays.  CSW informed Mr. Chadderdon mother of status of referral.

## 2012-02-27 NOTE — Telephone Encounter (Signed)
CSW placed call to [facility] requesting appt with psychiatry.  Message left with pt's information.

## 2012-02-28 NOTE — Telephone Encounter (Signed)
Received return call.  Pt has appt scheduled at [facility] with Dr. Lolly Mustache.

## 2012-03-07 ENCOUNTER — Ambulatory Visit (HOSPITAL_COMMUNITY): Payer: Medicare Other | Admitting: Psychiatry

## 2012-03-12 ENCOUNTER — Ambulatory Visit (INDEPENDENT_AMBULATORY_CARE_PROVIDER_SITE_OTHER): Payer: Self-pay | Admitting: Psychiatry

## 2012-03-12 ENCOUNTER — Encounter (HOSPITAL_COMMUNITY): Payer: Self-pay | Admitting: Psychiatry

## 2012-03-12 VITALS — BP 134/89 | HR 76 | Wt 154.4 lb

## 2012-03-12 DIAGNOSIS — F840 Autistic disorder: Secondary | ICD-10-CM

## 2012-03-12 NOTE — Progress Notes (Signed)
Chief complaint Per mother my son has autism.  History of present illness Patient is 51 year old [place] American male who came with his mother.  Appointment was made as mother was concerned that patient started to have some memory issues and he has notice slow down in his life.  Patient has autism.  Patient is a poor historian and most of information was obtained through the mother.  Patient and his mother denied any crying spells, agitation, anger mood swing.  His sleep is fine.  He denies any active or passive suicidal thoughts or paranoid thinking.  However mother notice that lately he is been more shy and not engage in conversation.  He stays most of the time to himself.  He watch TV all day and does not interact with people.  He tends to forget things.  He is in [employer] special classes.  As per mother he is high functional. There has been no recent aggression violence or panic attack.  Mother was hoping that he can get some counseling to help his K. medication or skills.  Mother is not interested in any medication .    Past psychiatric history No current psychiatric medication .  Past psychiatric history As per mother patient was seen in [facility] mental health many years ago for autism.  He was given medication however there was no improvement in his autism.  No history of inpatient psychiatric treatment, no history of suicidal attempt and no history of violence and aggression.  Medical history Patient has history of hypothyroidism, adrenal insufficiency, MR, autism and anemia.    Family history One sister has schizophrenia.    Psychosocial history Patient born and raised in [place].  He lives with his mother .    Education work history Patient is in Horticulturist, commercial.   Alcohol and substance use history The patient has a history of alcohol or substance use.  Mental status examination Patient is casually dressed and fairly groomed.  He appears older than his his stated  age.  He maintained poor eye contact.  He is shy.  He provided limited information.  His speech is slow and has thought blocking.  His thought process is also slow.  He denies any active or passive suicidal thoughts or homicidal thoughts.  There were no paranoia or delusion.  His response to every question is either yes or no.  He appears very tense and anxious during the conversation.  There were no tremors or shakes.  There were no hallucinations.  He has difficulty regaining however he was alert and oriented x3.  His fund of knowledge was poor.  He has difficulty expressing his symptoms.  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  We will refer to [facility] at [address] , [address], [place].  Telephone [phone].  At this time patient does not exhibit any symptoms of depression mania psychosis and does not need any psychotropic medication.  He will require coping and social skills and program is designed to help patient who has autism can help.  I recommend to call us if patient started to have any psychiatric symptoms.  I also recommend any time patient has suicidal thoughts or homicidal thoughts  and he need to call 911 or go to local emergency room.  Time spent 60 minutes.

## 2012-03-15 ENCOUNTER — Other Ambulatory Visit: Payer: Self-pay | Admitting: Internal Medicine

## 2012-03-15 DIAGNOSIS — E039 Hypothyroidism, unspecified: Secondary | ICD-10-CM

## 2012-04-13 ENCOUNTER — Ambulatory Visit: Payer: Self-pay | Admitting: Internal Medicine

## 2012-04-19 ENCOUNTER — Ambulatory Visit (HOSPITAL_COMMUNITY): Payer: Self-pay | Admitting: Psychology

## 2012-05-31 ENCOUNTER — Ambulatory Visit (INDEPENDENT_AMBULATORY_CARE_PROVIDER_SITE_OTHER): Payer: Medicare Other | Admitting: Psychology

## 2012-05-31 ENCOUNTER — Encounter (HOSPITAL_COMMUNITY): Payer: Self-pay | Admitting: Psychology

## 2012-05-31 DIAGNOSIS — F840 Autistic disorder: Secondary | ICD-10-CM
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
## 2012-06-15 ENCOUNTER — Encounter: Payer: Self-pay | Admitting: Internal Medicine

## 2012-06-15 ENCOUNTER — Ambulatory Visit (INDEPENDENT_AMBULATORY_CARE_PROVIDER_SITE_OTHER): Payer: Medicare Other | Admitting: Internal Medicine

## 2012-06-15 VITALS — BP 117/81 | HR 65 | Temp 97.1°F | Wt 150.5 lb

## 2012-06-15 DIAGNOSIS — E2749 Other adrenocortical insufficiency: Secondary | ICD-10-CM

## 2012-06-15 DIAGNOSIS — E039 Hypothyroidism, unspecified: Secondary | ICD-10-CM

## 2012-06-15 DIAGNOSIS — E2740 Unspecified adrenocortical insufficiency: Secondary | ICD-10-CM

## 2012-06-15 DIAGNOSIS — F840 Autistic disorder: Secondary | ICD-10-CM

## 2012-06-15 DIAGNOSIS — Z23 Encounter for immunization: Secondary | ICD-10-CM

## 2012-06-15 DIAGNOSIS — D126 Benign neoplasm of colon, unspecified: Secondary | ICD-10-CM

## 2012-06-15 NOTE — Progress Notes (Signed)
  Subjective:    Patient ID: Darren Mcbride, male    DOB: December 02, 1960, 51 y.o.   MRN: [id number]  HPI  Darren Mcbride presents today for followup of his hypothyroidism. His mother has accompanied him to the appointment and provides most of the history. Darren Mcbride himself feels fine and is without any complaints. His mother also notes there are no acute issues with Darren Mcbride. She states that he takes his own medications and she is unaware of his compliance. There are no reported issues with heat or cold intolerance, constipation or diarrhea, fevers, tremors, or palpitations.  Review of Systems  Constitutional: Negative for fever, activity change, appetite change and unexpected weight change.  HENT: Negative for neck pain.   Respiratory: Negative for shortness of breath and wheezing.   Cardiovascular: Negative for chest pain, palpitations and leg swelling.  Gastrointestinal: Negative for nausea, vomiting, abdominal pain, diarrhea and constipation.  Musculoskeletal: Negative for myalgias and arthralgias.  Skin: Negative for color change and rash.  Neurological: Negative for dizziness, tremors, seizures, weakness and light-headedness.  Psychiatric/Behavioral: Negative for behavioral problems, self-injury, dysphoric mood and agitation. The patient is not nervous/anxious and is not hyperactive.       Objective:   Physical Exam  Nursing note and vitals reviewed. Constitutional: He appears well-developed and well-nourished. No distress.  HENT:  Head: Normocephalic and atraumatic.  Eyes: Conjunctivae normal are normal. No scleral icterus.  Neck: Normal range of motion. Neck supple. No tracheal tenderness present. No tracheal deviation present. No mass and no thyromegaly present.  Cardiovascular: Normal rate, regular rhythm and normal heart sounds.  Exam reveals no gallop and no friction rub.   No murmur heard. Pulmonary/Chest: Effort normal and breath sounds normal. No stridor. No respiratory  distress. He has no wheezes. He has no rales. He exhibits no tenderness.  Abdominal: Soft. Bowel sounds are normal. He exhibits no distension. There is no tenderness. There is no rebound and no guarding.  Musculoskeletal: Normal range of motion. He exhibits no edema and no tenderness.  Lymphadenopathy:    He has no cervical adenopathy.  Neurological: He is alert.  Skin: Skin is warm and dry. No rash noted. He is not diaphoretic. No erythema.  Psychiatric: He has a normal mood and affect. His behavior is normal.      Assessment & Plan:   Please see problem oriented charting.

## 2012-06-15 NOTE — Patient Instructions (Signed)
It was nice to meet you.  I look forward to caring for you for years to come.  Keep taking all of your medications like you have been.  I will call you if there is the need to change your thyroid medication.  Today you got the flu shot.  I will see you back in 6 months.  Call if you need to be sooner.

## 2012-06-15 NOTE — Assessment & Plan Note (Signed)
Darren Mcbride currently lacks any signs or symptoms of hyper or hypothyroidism on his current dose of Synthroid. A TSH and free T4 have been drawn and are pending at the time of this dictation. These results will be followed up in appropriate changes in his regimen will be made, if necessary.

## 2012-06-15 NOTE — Assessment & Plan Note (Signed)
Darren Mcbride is currently clinically stable on the fludrocortisone and hydrocortisone medication. We will continue these at the current doses.

## 2012-06-15 NOTE — Assessment & Plan Note (Signed)
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 He will require a repeat surveillance colonoscopy in October 2017.

## 2012-06-15 NOTE — Assessment & Plan Note (Signed)
Mr. Uhl is followed in Behavioral Health for his autism spectrum disorder. He has a future appointment already scheduled. There are no acute issues with this problem.

## 2012-06-18 ENCOUNTER — Ambulatory Visit (INDEPENDENT_AMBULATORY_CARE_PROVIDER_SITE_OTHER): Payer: Medicare Other | Admitting: Psychology

## 2012-06-18 DIAGNOSIS — F840 Autistic disorder: Secondary | ICD-10-CM

## 2012-06-18 NOTE — Progress Notes (Signed)
THERAPIST PROGRESS NOTE  Session Time: 9.03pm-9.50pm  Participation Level: Active  Behavioral Response: Well GroomedAlertEuthymic  Type of Therapy: Individual Therapy  Treatment Goals addressed: Diagnosis: Autism Spectrum D/O and goal 1.  Interventions: Supportive and Other: psychoeducational  Summary: Darren Mcbride is a 51 y.o. male who presents with bright affect.  Pt and mom report some improvement with communication at home- mom less yelling and screaming as reports more aware of pt limitations.  Pt reported he is doing well and helping around the home, traveling independently on the bus and enjoying his time at [employer] for his educational program.  Pt and mom receptive to resources given re: individual services available for adults and parents at [facility] and also connected to [employer] of [place] w/ completing an online informational request for information.    Suicidal/Homicidal: Nowithout intent/plan  Therapist Response: Assessed pt current functioning per pt and parent report.  Processed w/pt and mom family communication improvements.  Discussed appropriate supports in the community to connect with and assisted w/ information to contact.    Plan: Pt and mom will f/u w/ [facility] and [place] [employer] to access further services in the community.  Pt and mom can call if needed for further assistance.  Diagnosis: Axis I: Autistic Disorder    Axis II: No diagnosis    Haris Baack, LPC 06/18/2012

## 2012-06-18 NOTE — Progress Notes (Signed)
° °  THERAPIST PROGRESS NOTE  THERAPIST PROGRESS NOTE  Session Time: 9.03pm-9.50pm  Participation Level: Active  Behavioral Response: Well GroomedAlertEuthymic  Type of Therapy: Individual Therapy  Treatment Goals addressed: Diagnosis: Autism Spectrum D/O and goal 1.  Interventions: Supportive and Other: psychoeducational  Summary: Darren Mcbride is a 51 y.o. male who presents with bright affect.  Pt and mom report some improvement with communication at home- mom less yelling and screaming as reports more aware of pt limitations.  Pt reported he is doing well and helping around the home, traveling independently on the bus and enjoying his time at [employer] for his educational program.  Pt and mom receptive to resources given re: individual services available for adults and parents at [facility] and also connected to [employer] Society of [place] w/ completing an online informational request for information.    Suicidal/Homicidal: Nowithout intent/plan  Therapist Response: Assessed pt current functioning per pt and parent report.  Processed w/pt and mom family communication improvements.  Discussed appropriate supports in the community to connect with and assisted w/ information to contact.    Plan: Pt and mom will f/u w/ [facility] and [place] [employer] to access further services in the community.  Pt and mom can call if needed for further assistance.  Diagnosis: Axis I: Autistic Disorder    Axis II: No diagnosis  Hedda Crumbley, LPC 06/18/2012

## 2012-06-18 NOTE — Progress Notes (Deleted)
   THERAPIST PROGRESS NOTE  Session Time: 9.03pm-9.50pm  Participation Level: Active  Behavioral Response: Well GroomedAlertEuthymic  Type of Therapy: Individual Therapy  Treatment Goals addressed: Diagnosis: Autism Spectrum D/O and goal 1.  Interventions: Supportive and Other: psychoeducational  Summary: Darren Mcbride is a 51 y.o. male who presents with bright affect.  Pt and mom report some improvement with communication at home- mom less yelling and screaming as reports more aware of pt limitations.  Pt reported he is doing well and helping around the home, traveling independently on the bus and enjoying his time at [employer] for his educational program.  Pt and mom receptive to resources given re: individual services available for adults and parents at [facility] and also connected to [employer]ism Society of [place] w/ completing an online informational request for information.    Suicidal/Homicidal: Nowithout intent/plan  Therapist Response: Assessed pt current functioning per pt and parent report.  Processed w/pt and mom family communication improvements.  Discussed appropriate supports in the community to connect with and assisted w/ information to contact.    Plan: Pt and mom will f/u w/ [facility] and [place] [employer] to access further services in the community.  Pt and mom can call if needed for further assistance.  Diagnosis: Axis I: Autistic Disorder    Axis II: No diagnosis    Zasha Belleau, LPC 06/18/2012

## 2012-06-20 ENCOUNTER — Other Ambulatory Visit: Payer: Self-pay | Admitting: Internal Medicine

## 2012-06-20 DIAGNOSIS — E2740 Unspecified adrenocortical insufficiency: Secondary | ICD-10-CM

## 2012-06-25 NOTE — Progress Notes (Signed)
TSH 0.654 Free T4 1.18  Thyroid function exactly where we would like it on synthroid 88 mcg PO QD.  Will continue current dose.

## 2012-08-21 ENCOUNTER — Encounter (HOSPITAL_COMMUNITY): Payer: Self-pay | Admitting: Psychology

## 2012-08-21 DIAGNOSIS — F840 Autistic disorder: Secondary | ICD-10-CM

## 2012-08-21 NOTE — Progress Notes (Signed)
Patient ID: Darren Mcbride, male   DOB: 13-Apr-1961, 52 y.o.   MRN: [id number] Outpatient Therapist Discharge Summary  Darren Mcbride    [birth year]   Admission Date: 05/31/12   Discharge Date:  08/21/12 Reason for Discharge:  Pt referred for appropriate services in the community Diagnosis:  1. Autism spectrum disorder     Comments:  Pt able to return if needed in future.  Forde Radon

## 2012-10-18 ENCOUNTER — Telehealth: Payer: Self-pay

## 2012-10-18 ENCOUNTER — Other Ambulatory Visit: Payer: Self-pay

## 2012-10-18 DIAGNOSIS — D369 Benign neoplasm, unspecified site: Secondary | ICD-10-CM

## 2012-10-18 NOTE — Telephone Encounter (Addendum)
Pt's mother states the pt is not mentally able to schedule any procedures and to schedule with her.

## 2012-10-18 NOTE — Telephone Encounter (Signed)
Pt has been scheduled for 11/08/12 [facility] per Marylin Crosby arrive 630 am  previsit scheduled Pt's mother is aware and previsit letter mailed

## 2012-10-23 ENCOUNTER — Telehealth: Payer: Self-pay

## 2012-10-23 NOTE — Telephone Encounter (Signed)
Appt has been moved to 930 pt is aware and will have previsit on 10/25/12

## 2012-10-23 NOTE — Telephone Encounter (Signed)
Message copied by Donata Duff on Tue Oct 23, 2012  8:34 AM ------      Message from: Rob Bunting P      Created: Mon Oct 22, 2012  1:15 PM       Patty,            He is scheduled for eus on the 27th at 7:30.  I need to be at a meeting until 8:30. Can you move him to follow the current 8:30 case on 27th and block off my 7:30 spot.  thanks ------

## 2012-10-25 ENCOUNTER — Ambulatory Visit (AMBULATORY_SURGERY_CENTER): Payer: Medicare Other | Admitting: *Deleted

## 2012-10-25 VITALS — Ht 68.0 in | Wt 153.4 lb

## 2012-10-25 DIAGNOSIS — Z1211 Encounter for screening for malignant neoplasm of colon: Secondary | ICD-10-CM

## 2012-10-25 DIAGNOSIS — Z860100 Personal history of colon polyps, unspecified: Secondary | ICD-10-CM

## 2012-10-30 ENCOUNTER — Encounter (HOSPITAL_COMMUNITY): Payer: Self-pay | Admitting: *Deleted

## 2012-11-02 ENCOUNTER — Encounter (HOSPITAL_COMMUNITY): Payer: Self-pay | Admitting: Pharmacy Technician

## 2012-11-08 ENCOUNTER — Encounter (HOSPITAL_COMMUNITY): Payer: Self-pay

## 2012-11-08 ENCOUNTER — Encounter (HOSPITAL_COMMUNITY)
Admission: RE | Disposition: A | Discharge status: Home / Self Care | Payer: Self-pay | Source: Ambulatory Visit | Attending: Gastroenterology

## 2012-11-08 ENCOUNTER — Ambulatory Visit (HOSPITAL_COMMUNITY): Payer: Medicare Other | Admitting: Certified Registered"

## 2012-11-08 ENCOUNTER — Encounter (HOSPITAL_COMMUNITY): Payer: Self-pay | Admitting: Certified Registered"

## 2012-11-08 ENCOUNTER — Ambulatory Visit (HOSPITAL_COMMUNITY)
Admission: RE | Admit: 2012-11-08 | Discharge: 2012-11-08 | Disposition: A | Discharge status: Home / Self Care | Payer: Medicare Other | Source: Ambulatory Visit | Attending: Gastroenterology | Admitting: Gastroenterology

## 2012-11-08 DIAGNOSIS — I10 Essential (primary) hypertension: Secondary | ICD-10-CM | POA: Insufficient documentation

## 2012-11-08 DIAGNOSIS — Z86010 Personal history of colonic polyps: Secondary | ICD-10-CM

## 2012-11-08 DIAGNOSIS — F840 Autistic disorder: Secondary | ICD-10-CM | POA: Insufficient documentation

## 2012-11-08 DIAGNOSIS — D369 Benign neoplasm, unspecified site: Secondary | ICD-10-CM

## 2012-11-08 DIAGNOSIS — E039 Hypothyroidism, unspecified: Secondary | ICD-10-CM | POA: Insufficient documentation

## 2012-11-08 DIAGNOSIS — Z09 Encounter for follow-up examination after completed treatment for conditions other than malignant neoplasm: Secondary | ICD-10-CM | POA: Insufficient documentation

## 2012-11-08 HISTORY — PX: COLONOSCOPY WITH PROPOFOL: SHX5780

## 2012-11-08 SURGERY — COLONOSCOPY WITH PROPOFOL
Anesthesia: Monitor Anesthesia Care

## 2012-11-08 MED ORDER — SODIUM CHLORIDE 0.9 % IV SOLN: INTRAVENOUS | Status: DC

## 2012-11-08 MED ORDER — PROPOFOL INFUSION 10 MG/ML OPTIME
INTRAVENOUS | Status: DC | PRN
  Administered 2012-11-08: 140 ug/kg/min via INTRAVENOUS

## 2012-11-08 MED ORDER — LACTATED RINGERS IV SOLN
INTRAVENOUS | Status: DC
  Administered 2012-11-08: 09:00:00 via INTRAVENOUS

## 2012-11-08 MED ORDER — LIDOCAINE HCL (PF) 2 % IJ SOLN
INTRAMUSCULAR | Status: DC | PRN
  Administered 2012-11-08: 20 mg

## 2012-11-08 SURGICAL SUPPLY — 22 items

## 2012-11-08 NOTE — Op Note (Signed)
[facility] [address] [place], [address]   COLONOSCOPY PROCEDURE REPORT  PATIENT: Harbor, Vanover  MR#: [id number] BIRTHDATE: [birth year] , 51  yrs. old GENDER: Male ENDOSCOPIST: Rachael Fee, MD PROCEDURE DATE:  11/08/2012 PROCEDURE:   Colonoscopy, surveillance ASA CLASS:   Class II INDICATIONS:TVA partially removed from cecum 2012, followup 2 months later removed 1cm residual TVA. MEDICATIONS: MAC sedation, administered by CRNA  DESCRIPTION OF PROCEDURE:   After the risks benefits and alternatives of the procedure were thoroughly explained, informed consent was obtained.  A digital rectal exam revealed no abnormalities of the rectum.   The EC-[id number] ([id number])  endoscope was introduced through the anus and advanced to the cecum, which was identified by both the appendix and ileocecal valve. No adverse events experienced.   The quality of the prep was good.  The instrument was then slowly withdrawn as the colon was fully examined.   COLON FINDINGS: A normal appearing cecum, ileocecal valve, and appendiceal orifice were identified.  The ascending, hepatic flexure, transverse, splenic flexure, descending, sigmoid colon and rectum appeared unremarkable.  No polyps or cancers were seen. Retroflexed views revealed no abnormalities. The time to cecum=3 minutes 00 seconds.  Withdrawal time=10 minutes 00 seconds.  The scope was withdrawn and the procedure completed. COMPLICATIONS: There were no complications.  ENDOSCOPIC IMPRESSION: Normal colon No polyps or cancers  RECOMMENDATIONS: You should have repeat colonoscopy in 3 years.   eSigned:  Rachael Fee, MD 11/08/2012 10:11 AM

## 2012-11-08 NOTE — Anesthesia Postprocedure Evaluation (Signed)
  Anesthesia Post-op Note  Patient: Darren Mcbride  Procedure(s) Performed: Procedure(s) (LRB): COLONOSCOPY WITH PROPOFOL (N/A)  Patient Location: PACU  Anesthesia Type: MAC  Level of Consciousness: awake and alert   Airway and Oxygen Therapy: Patient Spontanous Breathing  Post-op Pain: mild  Post-op Assessment: Post-op Vital signs reviewed, Patient's Cardiovascular Status Stable, Respiratory Function Stable, Patent Airway and No signs of Nausea or vomiting  Last Vitals:  Filed Vitals:   11/08/12 1056  BP: 125/79  Temp:   Resp: 16    Post-op Vital Signs: stable   Complications: No apparent anesthesia complications

## 2012-11-08 NOTE — Transfer of Care (Signed)
Immediate Anesthesia Transfer of Care Note  Patient: Darren Mcbride  Procedure(s) Performed: Procedure(s): COLONOSCOPY WITH PROPOFOL (N/A)  Patient Location: PACU  Anesthesia Type:MAC  Level of Consciousness: awake, alert , oriented and patient cooperative  Airway & Oxygen Therapy: Patient Spontanous Breathing and Patient connected to nasal cannula oxygen  Post-op Assessment: Report given to PACU RN and Post -op Vital signs reviewed and stable  Post vital signs: Reviewed and stable  Complications: No apparent anesthesia complications

## 2012-11-08 NOTE — Anesthesia Preprocedure Evaluation (Signed)
Anesthesia Evaluation  Patient identified by MRN, date of birth, ID band Patient awake    Reviewed: Allergy & Precautions, H&P , NPO status , Patient's Chart, lab work & pertinent test results  Airway Mallampati: I TM Distance: >3 FB Neck ROM: Full    Dental no notable dental hx.    Pulmonary neg pulmonary ROS,  breath sounds clear to auscultation  Pulmonary exam normal       Cardiovascular negative cardio ROS  Rhythm:Regular Rate:Normal     Neuro/Psych Autism negative neurological ROS  negative psych ROS   GI/Hepatic negative GI ROS, Neg liver ROS,   Endo/Other  negative endocrine ROSHypothyroidism Adrenal insufficiency  Renal/GU negative Renal ROS  negative genitourinary   Musculoskeletal negative musculoskeletal ROS (+)   Abdominal   Peds negative pediatric ROS (+)  Hematology negative hematology ROS (+)   Anesthesia Other Findings   Reproductive/Obstetrics negative OB ROS                           Anesthesia Physical Anesthesia Plan  ASA: II  Anesthesia Plan: MAC   Post-op Pain Management:    Induction:   Airway Management Planned: Simple Face Mask  Additional Equipment:   Intra-op Plan:   Post-operative Plan:   Informed Consent: I have reviewed the patients History and Physical, chart, labs and discussed the procedure including the risks, benefits and alternatives for the proposed anesthesia with the patient or authorized representative who has indicated his/her understanding and acceptance.   Dental advisory given  Plan Discussed with: CRNA  Anesthesia Plan Comments:         Anesthesia Quick Evaluation

## 2012-11-08 NOTE — H&P (Signed)
  HPI: This is a man with TVA removed 2012     Past Medical History  Diagnosis Date  . Adrenal insufficiency   . Hypothyroidism   . Cataract     s/p bilateral extraction  . Hypertension   . Mental disorder     hx. of autism-pt. is able to communicate needs    Past Surgical History  Procedure Laterality Date  . Hernia repair      @ birth  . Eye surgery      Bilateral cataract extraction  . Colon polyps  3-18=14    removal of colon growths"colon polyps"    Current Facility-Administered Medications  Medication Dose Route Frequency Provider Last Rate Last Dose  . 0.9 %  sodium chloride infusion   Intravenous Continuous Rachael Fee, MD      . lactated ringers infusion   Intravenous Continuous Rachael Fee, MD 20 mL/hr at 11/08/12 0848      Allergies as of 10/18/2012  . (No Known Allergies)    Family History  Problem Relation Age of Onset  . Diabetes Mother   . Hypertension Mother   . Cataracts Mother   . Breast cancer Mother 30    1985, surgery, chemo and XRT  . Prostate cancer Paternal Uncle   . Schizophrenia Sister   . Diabetes insipidus Sister   . Alcoholism Father   . Alcoholism Brother   . Alcoholism Brother   . Alcoholism Brother   . Congestive Heart Failure Brother   . Colon cancer Neg Hx   . Esophageal cancer Neg Hx   . Rectal cancer Neg Hx   . Stomach cancer Neg Hx     History   Social History  . Marital Status: Single    Spouse Name: N/A    Number of Children: N/A  . Years of Education: N/A   Occupational History  . Not on file.   Social History Main Topics  . Smoking status: Never Smoker   . Smokeless tobacco: Never Used  . Alcohol Use: No  . Drug Use: No  . Sexually Active: No   Other Topics Concern  . Not on file   Social History Narrative  . No narrative on file      Physical Exam: BP 138/93  Temp(Src) 97.5 F (36.4 C) (Oral)  Resp 18  Ht 5\' 8"  (1.727 m)  Wt 153 lb (69.4 kg)  BMI 23.27 kg/m2  SpO2  98% Constitutional: generally well-appearing Psychiatric: alert and oriented x3 Abdomen: soft, nontender, nondistended, no obvious ascites, no peritoneal signs, normal bowel sounds     Assessment and plan: 52 y.o. male with TVA removed 2012  Colonoscopy today

## 2012-11-09 ENCOUNTER — Encounter (HOSPITAL_COMMUNITY): Payer: Self-pay | Admitting: Gastroenterology

## 2012-12-10 ENCOUNTER — Ambulatory Visit (INDEPENDENT_AMBULATORY_CARE_PROVIDER_SITE_OTHER): Payer: Medicare Other | Admitting: Internal Medicine

## 2012-12-10 ENCOUNTER — Encounter: Payer: Self-pay | Admitting: Internal Medicine

## 2012-12-10 VITALS — BP 113/77 | HR 69 | Temp 97.1°F | Ht 67.5 in | Wt 150.1 lb

## 2012-12-10 DIAGNOSIS — D126 Benign neoplasm of colon, unspecified: Secondary | ICD-10-CM

## 2012-12-10 DIAGNOSIS — E2740 Unspecified adrenocortical insufficiency: Secondary | ICD-10-CM

## 2012-12-10 DIAGNOSIS — E2749 Other adrenocortical insufficiency: Secondary | ICD-10-CM

## 2012-12-10 DIAGNOSIS — E039 Hypothyroidism, unspecified: Secondary | ICD-10-CM

## 2012-12-10 DIAGNOSIS — F840 Autistic disorder: Secondary | ICD-10-CM

## 2012-12-10 MED ORDER — LEVOTHYROXINE SODIUM 88 MCG PO TABS: 88.0000 ug | ORAL_TABLET | Freq: Every day | ORAL | Status: DC

## 2012-12-10 MED ORDER — FLUDROCORTISONE ACETATE 0.1 MG PO TABS: 0.1000 mg | ORAL_TABLET | Freq: Every day | ORAL | Status: DC

## 2012-12-10 MED ORDER — HYDROCORTISONE 20 MG PO TABS: 20.0000 mg | ORAL_TABLET | Freq: Every day | ORAL | Status: DC

## 2012-12-10 NOTE — Progress Notes (Signed)
Patient ID: Darren Mcbride, male   DOB: 1960/10/28, 52 y.o.   MRN: [id number]  Subjective:   Patient ID: Darren Mcbride male   DOB: 1961-01-17 52 y.o.   MRN: [id number]  HPI: [name] is a 52 y.o. male with PMH autism, adrenal insufficiency, and hypothyroidism presents to [facility] for an acute visit.  He presents with his mother who is his primary caregiver, although he is able to dress and feed himself and he manages his own medications.  His mother is concerned that Darren Mcbride is not communicating as much as he use to, and has become more forgetful, forgetting phone numbers of family and friends. She states however he does know his own home phone number and phone number of a close friend. He is able to provide his correct home phone number during his evaluation. Per the mother she states he is also lying which he never used to do. She states that all of this has been going on since the end of last year. She's concerned because he use to tell her everything and now he doesn't tell her anything. However, she does state that she has threatened him before telling him that he will have to live in a facility if he cannot stay healthy, and since that time he has not confided in her as much. She thinks it's because he's afraid he will have to move away. His mother does help bathe him, and due to her increasing age and diabetes she is requesting a home health aide to assist.  He does go to school throughout the years at [employer], where he says they do arts and crafts, ankle and field trips. He states that he likes his school program. He and his mother are also active in their church. He was discharged from Behavioral health in January '14 because he did not require their services.   Past Medical History  Diagnosis Date  . Adrenal insufficiency   . Hypothyroidism   . Cataract     s/p bilateral extraction  . Hypertension   . Mental disorder     hx. of autism-pt. is able to communicate needs   Current  Outpatient Prescriptions  Medication Sig Dispense Refill  . fludrocortisone (FLORINEF) 0.1 MG tablet Take 0.1 mg by mouth daily.      . hydrocortisone (CORTEF) 20 MG tablet Take 1 tablet (20 mg total) by mouth daily.  90 tablet  3  . levothyroxine (SYNTHROID, LEVOTHROID) 88 MCG tablet Take 1 tablet (88 mcg total) by mouth daily.  30 tablet  11  . Multiple Vitamin (MULTIVITAMIN) capsule Take 1 capsule by mouth daily.         No current facility-administered medications for this visit.   Family History  Problem Relation Age of Onset  . Diabetes Mother   . Hypertension Mother   . Cataracts Mother   . Breast cancer Mother 24    1985, surgery, chemo and XRT  . Prostate cancer Paternal Uncle   . Schizophrenia Sister   . Diabetes insipidus Sister   . Alcoholism Father   . Alcoholism Brother   . Alcoholism Brother   . Alcoholism Brother   . Congestive Heart Failure Brother   . Colon cancer Neg Hx   . Esophageal cancer Neg Hx   . Rectal cancer Neg Hx   . Stomach cancer Neg Hx    History   Social History  . Marital Status: Single    Spouse Name: N/A    Number of Children:  N/A  . Years of Education: N/A   Social History Main Topics  . Smoking status: Never Smoker   . Smokeless tobacco: Never Used  . Alcohol Use: No  . Drug Use: No  . Sexually Active: No   Other Topics Concern  . Not on file   Social History Narrative  . No narrative on file   Review of Systems: A 10 point ROS was performed; pertinent positives and negatives were noted in the HPI   Objective:  Physical Exam: There were no vitals filed for this visit. Constitutional: Vital signs reviewed.  Patient is a well-developed and well-nourished male in no acute distress and cooperative with exam.   Head: Normocephalic and atraumatic Eyes: Conjunctivae normal, No scleral icterus.  Neck: Normal ROM Cardiovascular: RRR, no MRG Pulmonary/Chest: CTAB, no wheezes, rales, or rhonchi Abdominal: Non-tender,  non-distended Musculoskeletal: No joint deformities, erythema, or stiffness, full ROM Neurological: A&O x3, nonfocal Skin: Warm, dry and intact.  Psychiatric: Normal mood and affect. Behavior is normal, answers all questions appropriately. Very pleasant.  Assessment & Plan:   Please refer to Problem List based Assessment and Plan

## 2012-12-10 NOTE — Patient Instructions (Signed)
**  I will contact our social worker about getting a home health aid. She will contact you regarding the aid.  Darren Mcbride appears to be doing well. If you feel that his memory or ability to take care of himself is worsening, please call the clinic for him to be seen.  **Continue with the day school classes and other social activities- great job!

## 2012-12-10 NOTE — Assessment & Plan Note (Addendum)
[name] attends a day-school at [employer] year round and appears to really enjoy the program. He is able to dress and feed himself but mom helps with bathing him. She request an aid, as she is having increasing trouble due to her age and comorbidities. Will consult SW. With his memory loss, he seems very appropriate to me and is very talkative. If his memory worsens, mom is to call the clinic. I discussed this with the mother and with Alinda Money, and they both seemed very pleased.  - SW consult

## 2012-12-10 NOTE — Addendum Note (Signed)
Addended by: Genelle Gather on: 12/10/2012 05:26 PM   Modules accepted: Orders

## 2012-12-11 ENCOUNTER — Telehealth: Payer: Self-pay | Admitting: Licensed Clinical Social Worker

## 2012-12-11 NOTE — Telephone Encounter (Signed)
Darren Mcbride was referred to CSW for the initiation of PCS request.  Darren Mcbride currently lives with his mother, Darren Mcbride.  Darren Mcbride is the primary caregiver for Darren Mcbride.  As of late, Darren Mcbride is having increased difficulty to assisting pt with his ADL's.  CSW placed call to pt and discussed with Darren Mcbride.  PCS request initiated and faxed today.  Informed mother, [facility] will call to schedule independent assessment.  Darren Mcbride aware CSW is available to assist as needed.  CSW will sign off at this time.

## 2012-12-17 NOTE — Progress Notes (Signed)
Case discussed with Dr. Kathryn Glenn at the time of the visit, immediately after the resident saw the patient.  I reviewed the resident's history and exam and pertinent patient test results.  I agree with the assessment, diagnosis and plan of care documented in the resident's note.     

## 2013-01-31 ENCOUNTER — Ambulatory Visit (INDEPENDENT_AMBULATORY_CARE_PROVIDER_SITE_OTHER): Payer: Medicare Other | Admitting: Internal Medicine

## 2013-01-31 ENCOUNTER — Encounter: Payer: Self-pay | Admitting: Internal Medicine

## 2013-01-31 VITALS — BP 132/78 | HR 76 | Temp 97.8°F | Ht 69.0 in | Wt 152.3 lb

## 2013-01-31 DIAGNOSIS — H6123 Impacted cerumen, bilateral: Secondary | ICD-10-CM

## 2013-01-31 DIAGNOSIS — E039 Hypothyroidism, unspecified: Secondary | ICD-10-CM

## 2013-01-31 DIAGNOSIS — E2749 Other adrenocortical insufficiency: Secondary | ICD-10-CM

## 2013-01-31 DIAGNOSIS — H6120 Impacted cerumen, unspecified ear: Secondary | ICD-10-CM | POA: Insufficient documentation

## 2013-01-31 DIAGNOSIS — E2740 Unspecified adrenocortical insufficiency: Secondary | ICD-10-CM

## 2013-01-31 LAB — BASIC METABOLIC PANEL WITH GFR
Chloride: 105 mEq/L (ref 96–112)
Creat: 1.09 mg/dL (ref 0.50–1.35)
GFR, Est Non African American: 77 mL/min
Potassium: 4.2 mEq/L (ref 3.5–5.3)

## 2013-01-31 MED ORDER — LEVOTHYROXINE SODIUM 88 MCG PO TABS: 88.0000 ug | ORAL_TABLET | Freq: Every day | ORAL | Status: DC

## 2013-01-31 MED ORDER — FLUDROCORTISONE ACETATE 0.1 MG PO TABS: 0.1000 mg | ORAL_TABLET | Freq: Every day | ORAL | Status: DC

## 2013-01-31 NOTE — Progress Notes (Signed)
Patient ID: Darren Mcbride, male   DOB: August 20, 1960, 52 y.o.   MRN: [id number]  Subjective:   Patient ID: Darren Mcbride male   DOB: [birth year] 52 y.o.   MRN: [id number]  HPI: Darren Mcbride is a 52 y.o. male with history of autism, hypertension, hypothyroidism, and adrenal insufficiency presenting for an acute visit with chief complaint of change in speech tone and decreased hearing. His mother is with him and provides most of the history. According to her, over the past few weeks his voice is great much deeper and he has had trouble hearing. She says he's putting "grease " in his bilateral ear canals to help him hear better. She says that this grease is Vaseline. He  Acknowledges that he cannot hear very well but cannot provide much more detail beyond that, including a timeline of hearing loss. He denies any ear pain, ear discharge, headache, dizziness, nasal congestion, cough.   Past Medical History  Diagnosis Date  . Adrenal insufficiency   . Hypothyroidism   . Cataract     s/p bilateral extraction  . Hypertension   . Mental disorder     hx. of autism-pt. is able to communicate needs   Current Outpatient Prescriptions  Medication Sig Dispense Refill  . fludrocortisone (FLORINEF) 0.1 MG tablet Take 1 tablet (0.1 mg total) by mouth daily.  90 tablet  3  . hydrocortisone (CORTEF) 20 MG tablet Take 1 tablet (20 mg total) by mouth daily.  90 tablet  3  . levothyroxine (SYNTHROID, LEVOTHROID) 88 MCG tablet Take 1 tablet (88 mcg total) by mouth daily.  90 tablet  3  . Multiple Vitamin (MULTIVITAMIN) capsule Take 1 capsule by mouth daily.         No current facility-administered medications for this visit.   Family History  Problem Relation Age of Onset  . Diabetes Mother   . Hypertension Mother   . Cataracts Mother   . Breast cancer Mother 80    1985, surgery, chemo and XRT  . Prostate cancer Paternal Uncle   . Schizophrenia Sister   . Diabetes insipidus Sister   . Alcoholism Father    . Alcoholism Brother   . Alcoholism Brother   . Alcoholism Brother   . Congestive Heart Failure Brother   . Colon cancer Neg Hx   . Esophageal cancer Neg Hx   . Rectal cancer Neg Hx   . Stomach cancer Neg Hx    History   Social History  . Marital Status: Single    Spouse Name: N/A    Number of Children: N/A  . Years of Education: N/A   Social History Main Topics  . Smoking status: Never Smoker   . Smokeless tobacco: Never Used  . Alcohol Use: No  . Drug Use: No  . Sexually Active: No   Other Topics Concern  . None   Social History Narrative  . None   Review of Systems: 10 pt ROS performed, pertinent positives and negatives noted in HPI Objective:  Physical Exam: Filed Vitals:   01/31/13 0923  BP: 132/78  Pulse: 76  Temp: 97.8 F (36.6 C)  TempSrc: Oral  Height: 5\' 9"  (1.753 m)  Weight: 152 lb 4.8 oz (69.083 kg)  SpO2: 98%   Vitals reviewed. General: sitting in chair, NAD HEENT: bilateral auditory canals obstructed by thick, adherent cerumen. PERRL, EOMI, no scleral icterus Cardiac: RRR, no rubs, murmurs or gallops Pulm: clear to auscultation bilaterally, no wheezes, rales, or rhonchi Abd: soft, nontender,  nondistended, BS present Ext: scattered varicosities of LE   Assessment & Plan:   Please see problem-based charting for assessment and plan.

## 2013-01-31 NOTE — Progress Notes (Signed)
I saw patient and discussed his care with resident Dr. Heloise Beecham at the time of the visit.  We reviewed the resident's history and exam and pertinent patient test results.  I agree with the assessment, diagnosis, and plan of care documented in the resident's note.

## 2013-01-31 NOTE — Assessment & Plan Note (Signed)
No hypotension or complaints of dizziness. On fludrocortisone and hydrocortisone.  - Check basic metabolic panel today

## 2013-01-31 NOTE — Patient Instructions (Addendum)
1. NO MORE GREASE in your ears 2. I will call if anything concerning on labs     Treatment Goals:  Goals (1 Years of Data) as of 01/31/13         As of Today 12/10/12 11/08/12 11/08/12 11/08/12     Blood Pressure    . Blood Pressure < 140/90  132/78 113/77 125/79 115/82 114/75

## 2013-01-31 NOTE — Assessment & Plan Note (Signed)
Impressive amounts of impacted cerumen were successfully irrigated out of his bilateral EACs. Afterwards, patient reported he could hear much better. Repeat exam showed pearly TMs without perforation. Patient's mother said that now she can understand his speech much better. They're both pleased. - Advised patient not to put any grease, Vaseline, or other substances in his ear.

## 2013-01-31 NOTE — Assessment & Plan Note (Signed)
No signs or symptoms of hyper hyperthyroidism. He is taking Synthroid 88 mcg daily. Will check a free T4 and TSH today.

## 2013-02-01 ENCOUNTER — Encounter: Payer: Self-pay | Admitting: Internal Medicine

## 2013-02-06 ENCOUNTER — Encounter: Payer: Self-pay | Admitting: Internal Medicine

## 2013-02-06 ENCOUNTER — Ambulatory Visit (INDEPENDENT_AMBULATORY_CARE_PROVIDER_SITE_OTHER): Payer: Medicare Other | Admitting: Internal Medicine

## 2013-02-06 VITALS — BP 137/88 | HR 80 | Temp 96.8°F | Wt 153.3 lb

## 2013-02-06 DIAGNOSIS — I83893 Varicose veins of bilateral lower extremities with other complications: Secondary | ICD-10-CM

## 2013-02-06 DIAGNOSIS — I8393 Asymptomatic varicose veins of bilateral lower extremities: Secondary | ICD-10-CM | POA: Insufficient documentation

## 2013-02-06 DIAGNOSIS — J309 Allergic rhinitis, unspecified: Secondary | ICD-10-CM

## 2013-02-06 HISTORY — DX: Allergic rhinitis, unspecified: J30.9

## 2013-02-06 HISTORY — DX: Varicose veins of bilateral lower extremities with other complications: I83.893

## 2013-02-06 MED ORDER — OLOPATADINE HCL 0.2 % OP SOLN: 1.0000 [drp] | Freq: Every day | OPHTHALMIC | Status: DC

## 2013-02-06 MED ORDER — LEVOCETIRIZINE DIHYDROCHLORIDE 5 MG PO TABS: 5.0000 mg | ORAL_TABLET | Freq: Every evening | ORAL | Status: DC

## 2013-02-06 NOTE — Patient Instructions (Addendum)
1. Will start you on Xyzal and padaday for allergy.  2. Will give you prescription for compression stocking 10-15 mmHg 3.  wear the stocking during the day and remove the stock at nighttime. 4. Follow up in 3 months.

## 2013-02-06 NOTE — Progress Notes (Addendum)
Subjective:   Patient ID: Darren Mcbride male   DOB: 1960/11/16 52 y.o.   MRN: [id number] Chief complaint: Scratchy throat HPI: [name] is a 52 y.o. man with PMH of autism, hypertension, hypothyroidism, and adrenal insufficiency presenting for follow up visit.  He was evaluated for B/L cerumen impaction which was cleansed during last OV on 01/31/13. He reports eye itching and watery, sneezing, nasal congestion, postnasal drip, throat feeling scratching for past 4-5 days. He reports that his symptoms are worse when he is outside. He reports similar symptoms in the past, which always happen during the summer time.  Denies fever, chills or any other discomfort. Denies sick contact. He is here for evaluation.    Past Medical History  Diagnosis Date  . Adrenal insufficiency   . Hypothyroidism   . Cataract     s/p bilateral extraction  . Hypertension   . Mental disorder     hx. of autism-pt. is able to communicate needs   Current Outpatient Prescriptions  Medication Sig Dispense Refill  . fludrocortisone (FLORINEF) 0.1 MG tablet Take 1 tablet (0.1 mg total) by mouth daily.  90 tablet  3  . hydrocortisone (CORTEF) 20 MG tablet Take 1 tablet (20 mg total) by mouth daily.  90 tablet  3  . levothyroxine (SYNTHROID, LEVOTHROID) 88 MCG tablet Take 1 tablet (88 mcg total) by mouth daily.  90 tablet  3  . Multiple Vitamin (MULTIVITAMIN) capsule Take 1 capsule by mouth daily.         No current facility-administered medications for this visit.   Family History  Problem Relation Age of Onset  . Diabetes Mother   . Hypertension Mother   . Cataracts Mother   . Breast cancer Mother 42    1985, surgery, chemo and XRT  . Prostate cancer Paternal Uncle   . Schizophrenia Sister   . Diabetes insipidus Sister   . Alcoholism Father   . Alcoholism Brother   . Alcoholism Brother   . Alcoholism Brother   . Congestive Heart Failure Brother   . Colon cancer Neg Hx   . Esophageal cancer Neg Hx    . Rectal cancer Neg Hx   . Stomach cancer Neg Hx    History   Social History  . Marital Status: Single    Spouse Name: N/A    Number of Children: N/A  . Years of Education: N/A   Social History Main Topics  . Smoking status: Never Smoker   . Smokeless tobacco: Never Used  . Alcohol Use: No  . Drug Use: No  . Sexually Active: No   Other Topics Concern  . None   Social History Narrative  . None   Review of Systems: Review of Systems:  Constitutional:  Denies fever, chills, diaphoresis, appetite change and fatigue.   HEENT:  Positive for eye itching and watery, nasal congestion, scratchy throat, rhinorrhea, sneezing, denies mouth sores, trouble swallowing, neck pain   Respiratory:  Denies SOB, DOE, cough, and wheezing.   Cardiovascular:  Denies palpitations and leg swelling.   Gastrointestinal:  Denies nausea, vomiting, abdominal pain, diarrhea, constipation, blood in stool and abdominal distention.   Genitourinary:  Denies dysuria, urgency, frequency, hematuria, flank pain and difficulty urinating.   Musculoskeletal:  Denies myalgias, back pain, joint swelling, arthralgias and gait problem.   Skin:  Denies pallor, rash and wound.   Neurological:  Denies dizziness, seizures, syncope, weakness, light-headedness, numbness and headaches.    .    Objective:  Physical  Exam: Filed Vitals:   02/06/13 0902  BP: 137/88  Pulse: 80  Temp: 96.8 F (36 C)  TempSrc: Oral  Weight: 153 lb 4.8 oz (69.536 kg)  SpO2: 97%   General: sitting in chair, NAD  HEENT:  PERRL, EOMI, no scleral icterus Ear: No erythema or trauma noted on auricle and Tragus. No edema, erythema or frank necrosis noted on ear canal. No cerumen noted.The tympanic membrane intact. no presence of an air-fluid level along the tympanic membrane.  Mouth: mild pharynx redness noted. moist, and no exudates. Nose: No erythema, drainage, mucosa swelling or bleeding. Neck: supple, full ROM, no thyromegaly, no JVD, and  no carotid bruits.  No lymphadenopathy. Cardiac: RRR, no rubs, murmurs or gallops  Pulm: clear to auscultation bilaterally, no wheezes, rales, or rhonchi  Abd: soft, nontender, nondistended, BS present  Ext: multiple varicosities of B/L LE. 1+ edema   Assessment & Plan:

## 2013-02-06 NOTE — Assessment & Plan Note (Signed)
The clinical manifestation is consistent with allergic rhinitis.   - will give him Xyzal ( he request prescription form) - will give him Pataday eye drop.

## 2013-02-06 NOTE — Assessment & Plan Note (Addendum)
Patient is asymptomatic. Patient and his mother states that he has had this condition for decades.  Denies family history of varicose veins. No history of DVT. Physical examination reveals multiple varicose veins on B/L LE with mild edema. No skin breakdown and ulcers noted. DP 2+ B/L.   - The diagnosis discussed with patient and his mother.  - conservative measures    1. patient is instructed to elevate legs as possible.    2. will prescribe compression stockings. - Should he become symptomatic, have signs of severe venous disease (skin ulcers) or is unresponsive to conservative measures within 6 months of initiating therapy, will need to consider duplex ultrasound to evaluate the nature and extent of venous reflux, and/or consider vein ablation therapy.

## 2013-02-13 NOTE — Progress Notes (Signed)
Case discussed with Dr. Li at the time of the visit.  We reviewed the resident's history and exam and pertinent patient test results.  I agree with the assessment, diagnosis, and plan of care documented in the resident's note. 

## 2013-02-22 ENCOUNTER — Encounter: Payer: Self-pay | Admitting: Internal Medicine

## 2013-02-22 ENCOUNTER — Ambulatory Visit (INDEPENDENT_AMBULATORY_CARE_PROVIDER_SITE_OTHER): Payer: Medicare Other | Admitting: Internal Medicine

## 2013-02-22 VITALS — BP 129/80 | HR 65 | Temp 97.9°F | Wt 151.2 lb

## 2013-02-22 DIAGNOSIS — F840 Autistic disorder: Secondary | ICD-10-CM

## 2013-02-22 DIAGNOSIS — F0391 Unspecified dementia with behavioral disturbance: Secondary | ICD-10-CM

## 2013-02-22 DIAGNOSIS — Z8521 Personal history of malignant neoplasm of larynx: Secondary | ICD-10-CM | POA: Insufficient documentation

## 2013-02-22 DIAGNOSIS — E039 Hypothyroidism, unspecified: Secondary | ICD-10-CM

## 2013-02-22 DIAGNOSIS — R490 Dysphonia: Secondary | ICD-10-CM

## 2013-02-22 DIAGNOSIS — D539 Nutritional anemia, unspecified: Secondary | ICD-10-CM

## 2013-02-22 LAB — VITAMIN B12: Vitamin B-12: 589 pg/mL (ref 211–911)

## 2013-02-22 NOTE — Patient Instructions (Addendum)
We will call you with the time of your Neurology referral.  We will call you with the results of the blood test if they are abnormal  Please make an appointment to see ENT for the voice box problems.

## 2013-02-22 NOTE — Assessment & Plan Note (Addendum)
Assessment:  The patient has dysphonic speech that could be secondary to problems within the larynx.  Plan:  We recommended that the patient be referred to an ENT specialist for evaluation and treatment. The patient's primary care giver has an ENT that she likes and plans to get an appointment for the patient. We plan to f/u as needed.

## 2013-02-22 NOTE — Progress Notes (Signed)
Subjective:   Patient ID: Darren Mcbride male   DOB: [birth year] 52 y.o.   MRN: [id number]  HPI: [name] is a very pleasant and kind 52 y.o. male with a pmhx of autism, hypothyroidism, and adrenal insufficiency who presents to the clinic today with a cc of memory/behavioral problems. The history was taken from the patient with considerable help from his mother who is his primary care giver. Over the last 2/3 years, the patient has had increasing trouble with memory. Over this time period he has slowly progressive decrease in short term memory.  Furthermore, he has increasing difficulty maintaining his activities of daily living.  Before this gradual decline, the patient was able to dress himself, shave, and cook for his mother with help. Now, he is having significant difficulty performing these actions.  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 The teachers state that he was always the smartest student that they have. He was a top-performer, knowing all the answers to game shows. Now, he struggles to participate in these games and his teachers have commented that they think he needs to see a doctor. The patient has no problems with agitation, violent outbursts, or mood disturbance.  Of note, the patient has had increased trouble speaking for the last 2 months. The patient has tried allergy medicines with no improvement of symptoms. The patient denies difficulty swallowing, dyspnea, heart burn, coughing, and things becoming stuck in his throat.    Past Medical History  Diagnosis Date  . Adrenal insufficiency   . Hypothyroidism   . Cataract     s/p bilateral extraction  . Hypertension   . Mental disorder     hx. of autism-pt. is able to communicate needs   Current Outpatient Prescriptions  Medication Sig Dispense Refill  . fludrocortisone (FLORINEF) 0.1 MG tablet Take 1 tablet (0.1 mg total) by mouth daily.  90 tablet  3  . hydrocortisone (CORTEF) 20 MG  tablet Take 1 tablet (20 mg total) by mouth daily.  90 tablet  3  . levocetirizine (XYZAL) 5 MG tablet Take 1 tablet (5 mg total) by mouth every evening.  30 tablet  1  . levothyroxine (SYNTHROID, LEVOTHROID) 88 MCG tablet Take 1 tablet (88 mcg total) by mouth daily.  90 tablet  3  . Multiple Vitamin (MULTIVITAMIN) capsule Take 1 capsule by mouth daily.        . Olopatadine HCl (PATADAY) 0.2 % SOLN Apply 1 drop to eye daily.  2.5 mL  1   No current facility-administered medications for this visit.   Family History  Problem Relation Age of Onset  . Diabetes Mother   . Hypertension Mother   . Cataracts Mother   . Breast cancer Mother 30    1985, surgery, chemo and XRT  . Prostate cancer Paternal Uncle   . Schizophrenia Sister   . Diabetes insipidus Sister   . Alcoholism Father   . Alcoholism Brother   . Alcoholism Brother   . Alcoholism Brother   . Congestive Heart Failure Brother   . Colon cancer Neg Hx   . Esophageal cancer Neg Hx   . Rectal cancer Neg Hx   . Stomach cancer Neg Hx    History   Social History  . Marital Status: Single    Spouse Name: N/A    Number of Children: N/A  . Years of Education: N/A   Social History Main Topics  . Smoking status: Never Smoker   .  Smokeless tobacco: Never Used  . Alcohol Use: No  . Drug Use: No  . Sexually Active: No   Other Topics Concern  . None   Social History Narrative  . None   Review of Systems:  Review of Systems  Constitutional: Negative for fever, chills and malaise/fatigue.  HENT: Positive for congestion and sore throat.   Eyes:       + tearing  Respiratory: Negative for cough and shortness of breath.   Cardiovascular: Negative for chest pain and palpitations.  Gastrointestinal: Negative for heartburn, nausea, vomiting, abdominal pain, diarrhea and constipation.  Genitourinary: Negative for dysuria and urgency.  Musculoskeletal: Negative.   Skin: Negative for itching and rash.  Neurological: Positive for  speech change. Negative for tremors, sensory change, focal weakness, seizures and headaches.  Psychiatric/Behavioral: Positive for memory loss. Negative for depression.    Objective:  Physical Exam: Filed Vitals:   02/22/13 0938  BP: 129/80  Pulse: 65  Temp: 97.9 F (36.6 C)  TempSrc: Oral  Weight: 151 lb 3.2 oz (68.584 kg)  SpO2: 99%   Physical Exam  Constitutional: He is oriented to person, place, and time. He appears well-developed and well-nourished. No distress.  HENT:  Head: Normocephalic.  Mild erythema of oropharynx  Eyes:  Exodeviation OD  Neck: Normal range of motion. Neck supple. No tracheal deviation present. No thyromegaly present.  Cardiovascular: Normal rate, regular rhythm, normal heart sounds and intact distal pulses.  Exam reveals no gallop and no friction rub.   No murmur heard. Pulmonary/Chest: Effort normal and breath sounds normal. No respiratory distress. He has no wheezes. He has no rales. He exhibits no tenderness.  Abdominal: Soft. Bowel sounds are normal. He exhibits no distension.  Lymphadenopathy:    He has no cervical adenopathy.  Neurological: He is alert and oriented to person, place, and time. He has normal strength. He displays no atrophy and no tremor. No cranial nerve deficit or sensory deficit. He exhibits normal muscle tone. Coordination and gait normal.  Chronic exodeviation OD. Dysphonic speech. Attention intact to WORLD backwords, and 3/3 three word test at 5 minutes.  Skin: He is not diaphoretic.  Psychiatric: He is inattentive.      Assessment & Plan:

## 2013-02-22 NOTE — Assessment & Plan Note (Addendum)
Assessment:  The patient has a TSH of 3.04 and free T4 of 1.1, indicating that his hypothyroidism is appropriately treated with levothyroxine.  Plan:  Continue current management.

## 2013-02-22 NOTE — Assessment & Plan Note (Addendum)
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 It is possible that B12 deficiency could be contributing to the patients declining mental status  Plan:  The patients family requests a referral to neurology due to worsening mental status, and we support this plan.  We have made a referral to neurology for evaluation of the patients likely dementia. Meanwhile, we have ordered B12 levels and will follow-up with that as needed.

## 2013-02-25 NOTE — Progress Notes (Signed)
I saw and evaluated the patient.  I personally confirmed the key portions of the history and exam documented by Dr. Komanski and I reviewed pertinent patient test results.  The assessment, diagnosis, and plan were formulated together and I agree with the documentation in the resident's note.  

## 2013-03-12 ENCOUNTER — Encounter: Payer: Self-pay | Admitting: Neurology

## 2013-03-14 ENCOUNTER — Ambulatory Visit (INDEPENDENT_AMBULATORY_CARE_PROVIDER_SITE_OTHER): Payer: Medicare Other | Admitting: Neurology

## 2013-03-14 ENCOUNTER — Encounter: Payer: Self-pay | Admitting: Neurology

## 2013-03-14 VITALS — BP 133/87 | HR 84 | Ht 68.0 in | Wt 153.0 lb

## 2013-03-14 DIAGNOSIS — R4189 Other symptoms and signs involving cognitive functions and awareness: Secondary | ICD-10-CM

## 2013-03-14 DIAGNOSIS — F840 Autistic disorder: Secondary | ICD-10-CM

## 2013-03-14 DIAGNOSIS — F09 Unspecified mental disorder due to known physiological condition: Secondary | ICD-10-CM

## 2013-03-14 NOTE — Progress Notes (Signed)
[facility]  Provider:  Dr Hosie Poisson Referring Provider: Rocco Serene, MD Primary Care Physician:  Rocco Serene, MD  No chief complaint on file.   HPI:  Darren Mcbride is a 52 y.o. male here as a referral from Dr. Josem Kaufmann for progressive cognitive decline.  Darren Mcbride is a pleasant 52 year old African American gentleman with an at birth diagnosis of autism presenting for initial evaluation of cognitive decline. He presents today with his mother who gives the majority of the history. His remote history of autism including the diagnosis is unclear based on history his mother provided. Per the mother he was diagnosed with autism at  birth, he was noted to have a complicated delivery. He spent the first 6 to 7 years of his life at [facility], he then lived with foster family briefly before returning to live with his mother at age 3. He has lived with her since then. He finished school through the ninth grade, but has been going to an autism school for the past 17 years, and the school he does arts crafts math and reading. He is held a few different jobs in the past, he worked at [employer], and and recently worked Advertising account executive.  His mother notes that his cognitive problems started around 5 years ago around the time he was diagnosed with hypothyroidism. She initially noticed he started having trouble with short term memory, trouble following directions, trouble remembering where things go around the house. She denies any trouble with remote memory. They have noticed increased difficulty in performing ADLs. Initially this was a slowly progressive problem, and the past year she feels it as markedly increased. His teacher at the autism school also has expressed concern that he has began to struggle in school compared to prior performance, he is having more difficulty participating in activities. There  have not been any  outbursts agitation or anger issues.  In the past year his mom has also noticed progressive worsening of his voice, described as getting lower and harder to understand. During this time he has complained of a sore throat correlating with the voice change. Mom does feel the voice is gone slightly stronger easier to understand in the past few months. Recently he was found to have difficulty with hearing, secondary to likely cerumen impaction. This has improved with removal of the cerumen.  He remains active, reports he enjoys going to [employer] to read books. Also enjoys comedy shows on TV.   Review of Systems: Out of a complete 14 system review, the patient complains of only the following symptoms, and all other reviewed systems are negative. Positive for memory loss  History   Social History  . Marital Status: Single    Spouse Name: N/A    Number of Children: N/A  . Years of Education: N/A   Occupational History  . Not on file.   Social History Main Topics  . Smoking status: Never Smoker   . Smokeless tobacco: Never Used  . Alcohol Use: No  . Drug Use: No  . Sexually Active: No   Other Topics Concern  . Not on file   Social History Narrative  . No narrative on file    Family History  Problem Relation Age of Onset  . Diabetes Mother   . Hypertension Mother   . Cataracts Mother   . Breast cancer Mother 6    1985, surgery, chemo and  XRT  . Prostate cancer Paternal Uncle   . Schizophrenia Sister   . Diabetes insipidus Sister   . Alcoholism Father   . Alcoholism Brother   . Alcoholism Brother   . Alcoholism Brother   . Congestive Heart Failure Brother   . Colon cancer Neg Hx   . Esophageal cancer Neg Hx   . Rectal cancer Neg Hx   . Stomach cancer Neg Hx     Past Medical History  Diagnosis Date  . Adrenal insufficiency   . Hypothyroidism   . Cataract     s/p bilateral extraction  . Hypertension   . Mental disorder     hx. of autism-pt.  is able to communicate needs    Past Surgical History  Procedure Laterality Date  . Hernia repair      @ birth  . Eye surgery      Bilateral cataract extraction  . Colon polyps  3-18=14    removal of colon growths"colon polyps"  . Colonoscopy with propofol N/A 11/08/2012    Procedure: COLONOSCOPY WITH PROPOFOL;  Surgeon: Rachael Fee, MD;  Location: [facility];  Service: Endoscopy;  Laterality: N/A;    Current Outpatient Prescriptions  Medication Sig Dispense Refill  . fludrocortisone (FLORINEF) 0.1 MG tablet Take 1 tablet (0.1 mg total) by mouth daily.  90 tablet  3  . hydrocortisone (CORTEF) 20 MG tablet Take 1 tablet (20 mg total) by mouth daily.  90 tablet  3  . levocetirizine (XYZAL) 5 MG tablet Take 1 tablet (5 mg total) by mouth every evening.  30 tablet  1  . levothyroxine (SYNTHROID, LEVOTHROID) 88 MCG tablet Take 1 tablet (88 mcg total) by mouth daily.  90 tablet  3  . Multiple Vitamin (MULTIVITAMIN) capsule Take 1 capsule by mouth daily.        . Olopatadine HCl (PATADAY) 0.2 % SOLN Apply 1 drop to eye daily.  2.5 mL  1   No current facility-administered medications for this visit.    Allergies as of 03/14/2013  . (No Known Allergies)    Vitals: There were no vitals taken for this visit. Last Weight:  Wt Readings from Last 1 Encounters:  02/22/13 151 lb 3.2 oz (68.584 kg)   Last Height:   Ht Readings from Last 1 Encounters:  01/31/13 5\' 9"  (1.753 m)     Physical exam: Exam: Gen: NAD, conversant Eyes: anicteric sclerae, moist conjunctivae, temporal deviation of OD HENT: Atraumatic Neck: Trachea midline; supple,  Lungs: CTA, no wheezing, rales, rhonic                          CV: RRR, no MRG Abdomen: Soft, non-tender;  Extremities: No peripheral edema  Skin: Normal temperature, no rash,  Psych: Appropriate affect, pleasant  Neuro: MS: AA&Ox3, appropriately interactive, normal affect   Attention: WORLD backwards  Speech: low, hoarse dysphonic  speech  MOCA 27/30 (26 +1 for low education) -1 animal naming -3 delayed recall  Negative Glabellar, snout, palmomental reflex  CN: PERRL, EOMI no nystagmus, lateral deviation OD, , sensation intact to LT V1-V3 bilat, face symmetric, no weakness, hearing grossly intact, palate elevates symmetrically, shoulder shrug 5/5 bilat,  tongue protrudes midline, no fasiculations noted.  Motor: normal bulk and tone Strength: 5/5  In all extremities  Coord: rapid alternating and point-to-point (FNF, HTS) movements intact. Mild bilat intention tremor, no noted resting, wing-beating tremor  Reflexes: symmetrical, bilat downgoing toes  Sens: LT intact  in all extremities  Gait: stands without assistance, normal base, stooped posture, no shuffling steps, holds arms flexed, no instability noted   Assessment:  After physical and neurologic examination, review of laboratory studies, imaging, neurophysiology testing and pre-existing records, assessment will be reviewed on the problem list.  Plan:  Treatment plan and additional workup will be reviewed under Problem List.  Darren Mcbride is a pleasant 52 year old gentleman with a diagnosis of autism since birth presenting with his mother for initial evaluation of progressive cognitive decline over the past 5 years. These problems are occurring most predominantly with his short-term memory, and that also was difficulties with performing ADLs. His physical exam was mostly unremarkable, his MOCA cognitive testing showed a score of 27/30 which is within normal range.  The MOCA deficiencies were in language fluency and delayed recall. The etiology of this subjective cognitive decline is unclear at this point. Based on his age and temperature in she is relatively young for a diagnosis of dementia of Alzheimer's type. Therefore we need to rule out potentially reversible metabolic and structural causes. The differential would also include depression and/or and other mood  disorder, but this will be a diagnosis of exclusion pending workup for a neurological cause.  Cognitive decline - Plan: RPR, Magnesium, Sedimentation Rate, Hepatic Function Panel, Ceruloplasmin, MR Brain Wo Contrast, Ambulatory referral to Neuropsychology  Autism spectrum disorder - Plan: Ambulatory referral to Neuropsychology  -will check brain MRI -referral to neuro-psych for formal cognitive testing to better determine underlying cognitive defecits -check ceruloplasmin, RPR, Magnesium, LFTs, ESR -in the future can consider LP and/or EEG -will consider more formal evaluation for possible depression mimicking depression at next visit -if speech has not improved at next visit can consider speech and/or ENT evaluation  Follow up in 4 months  A total of 45 minutes was spent with this patient and his mother. Over half the time was spent in direct face-to-face consultation discussing the underlying diagnosis. We discussed the differential diagnosis, a potential workup, and different therapeutic options. All questions were completely answered.

## 2013-03-14 NOTE — Patient Instructions (Addendum)
Overall you are doing fairly well but I do want to suggest a few things today:   As far as diagnostic testing, I would like to suggest the following: -MRI of the brain -Formal cognitive testing done with neuro-psychology -Lab work to check for potentially treatable causes of your memory decline  I would like to see you back in 4 to 6 months, sooner if we need to. Please call us with any interim questions, concerns, problems, updates or refill requests.   Please also call us for any test results so we can go over those with you on the phone.  My clinical assistant and will answer any of your questions and relay your messages to me and also relay most of my messages to you.   Our phone number is [phone]. We also have an after hours call service for urgent matters and there is a physician on-call for urgent questions. For any emergencies you know to call 911 or go to the nearest emergency room

## 2013-03-15 ENCOUNTER — Telehealth: Payer: Self-pay | Admitting: *Deleted

## 2013-03-15 LAB — HEPATIC FUNCTION PANEL
ALT: 19 IU/L (ref 0–44)
Albumin: 4.5 g/dL (ref 3.5–5.5)
Total Bilirubin: 0.3 mg/dL (ref 0.0–1.2)

## 2013-03-15 LAB — SEDIMENTATION RATE: Sed Rate: 3 mm/hr (ref 0–30)

## 2013-03-15 LAB — RPR: RPR: NONREACTIVE

## 2013-03-15 NOTE — Telephone Encounter (Signed)
Message copied by Avie Echevaria on Fri Mar 15, 2013 12:18 PM ------      Message from: Ramond Marrow      Created: Fri Mar 15, 2013  8:08 AM       Please call patient and let them know labs are all normal. ------

## 2013-03-20 ENCOUNTER — Telehealth: Payer: Self-pay

## 2013-03-20 DIAGNOSIS — R413 Other amnesia: Secondary | ICD-10-CM

## 2013-03-20 NOTE — Telephone Encounter (Signed)
Message copied by [facility] on Wed Mar 20, 2013  5:10 PM ------      Message from: Ramond Marrow      Created: Fri Mar 15, 2013  8:08 AM       Please call patient and let them know labs are all normal. ------

## 2013-03-20 NOTE — Telephone Encounter (Signed)
I called patient and spoke with his mother. All lab work is normal. She asked about tests he took today. I told her we haven't gotten results back yet but will call her when we do.

## 2013-03-21 ENCOUNTER — Other Ambulatory Visit: Payer: Self-pay | Admitting: Diagnostic Neuroimaging

## 2013-03-21 DIAGNOSIS — R4189 Other symptoms and signs involving cognitive functions and awareness: Secondary | ICD-10-CM

## 2013-03-22 ENCOUNTER — Telehealth: Payer: Self-pay

## 2013-03-22 NOTE — Telephone Encounter (Signed)
Message copied by [facility] on Fri Mar 22, 2013  4:17 PM ------      Message from: Ramond Marrow      Created: Fri Mar 22, 2013  4:01 PM       Please call patient and let him know MRI is unremarkable. Thanks. ------

## 2013-03-22 NOTE — Telephone Encounter (Signed)
I called patient and spoke with his mother. I let her know that Dr. Hosie Poisson reviewed MRI results and found that it was unermarkable which means there is nothing of concern to him at this time. Mother thanked me and had no questions.

## 2013-04-17 ENCOUNTER — Telehealth (HOSPITAL_COMMUNITY): Payer: Self-pay

## 2013-04-17 NOTE — Telephone Encounter (Signed)
04/17/13 3:14pm Spoke with pt's mother  due to a referral from [facility]- pt's mother stated that he is autistic - pt's mother states that he needs a doctor for his throat not a mental doctor.  Advised pt's mother to call his pcp./sh

## 2013-05-24 ENCOUNTER — Ambulatory Visit (INDEPENDENT_AMBULATORY_CARE_PROVIDER_SITE_OTHER): Payer: Medicare Other | Admitting: Internal Medicine

## 2013-05-24 ENCOUNTER — Encounter: Payer: Self-pay | Admitting: Internal Medicine

## 2013-05-24 VITALS — BP 127/83 | HR 93 | Temp 97.7°F | Wt 152.9 lb

## 2013-05-24 DIAGNOSIS — R490 Dysphonia: Secondary | ICD-10-CM

## 2013-05-24 DIAGNOSIS — E039 Hypothyroidism, unspecified: Secondary | ICD-10-CM

## 2013-05-24 DIAGNOSIS — Z125 Encounter for screening for malignant neoplasm of prostate: Secondary | ICD-10-CM

## 2013-05-24 DIAGNOSIS — E2740 Unspecified adrenocortical insufficiency: Secondary | ICD-10-CM

## 2013-05-24 DIAGNOSIS — Z23 Encounter for immunization: Secondary | ICD-10-CM

## 2013-05-24 DIAGNOSIS — E2749 Other adrenocortical insufficiency: Secondary | ICD-10-CM

## 2013-05-24 DIAGNOSIS — Z0000 Encounter for general adult medical examination without abnormal findings: Secondary | ICD-10-CM | POA: Insufficient documentation

## 2013-05-24 DIAGNOSIS — K219 Gastro-esophageal reflux disease without esophagitis: Secondary | ICD-10-CM

## 2013-05-24 HISTORY — DX: Gastro-esophageal reflux disease without esophagitis: K21.9

## 2013-05-24 LAB — LIPID PANEL
HDL: 47 mg/dL (ref >39)
Total CHOL/HDL Ratio: 2.7 Ratio
Triglycerides: 43 mg/dL (ref <150)

## 2013-05-24 LAB — PSA, MEDICARE: PSA: 0.55 ng/mL (ref <=4.00)

## 2013-05-24 LAB — BASIC METABOLIC PANEL WITH GFR
BUN: 16 mg/dL (ref 6–23)
Chloride: 104 mEq/L (ref 96–112)
GFR, Est Non African American: 85 mL/min
Glucose, Bld: 55 mg/dL — ABNORMAL LOW (ref 70–99)
Potassium: 4.1 mEq/L (ref 3.5–5.3)

## 2013-05-24 MED ORDER — OMEPRAZOLE 20 MG PO CPDR: 20.0000 mg | DELAYED_RELEASE_CAPSULE | Freq: Every day | ORAL | Status: DC

## 2013-05-24 NOTE — Assessment & Plan Note (Signed)
At the last clinic visit his TSH and free T4 were at target. He is due for a lipid panel assessment and this was drawn today and is pending at the time of this dictation. We will followup on the lab results when available and make any necessary changes to his regimen at that time.

## 2013-05-24 NOTE — Patient Instructions (Addendum)
It was great to see you again.  You are doing well.  1) Keep taking your medications as you are.  2) We started omeprazole 20 mg by mouth daily for your acid reflux and voice change.  3) We gave you a flu shot today.  I will call you with your blood test results early next week.  I will see you back in 3 months to check your voice, sooner if necessary.

## 2013-05-24 NOTE — Assessment & Plan Note (Signed)
We will start omeprazole 20 mg by mouth daily and reassess his symptoms of heartburn and hoarseness at the followup visit in 3 months.

## 2013-05-24 NOTE — Progress Notes (Signed)
  Subjective:    Patient ID: Darren Mcbride, male    DOB: February 24, 1961, 52 y.o.   MRN: [id number]  HPI  Please see the A&P for the status of the pt's chronic medical problems.  Review of Systems  Constitutional: Negative for fever, activity change, appetite change and unexpected weight change.  HENT: Positive for voice change. Negative for sore throat and trouble swallowing.   Respiratory: Negative for cough, chest tightness, shortness of breath and wheezing.   Cardiovascular: Positive for chest pain. Negative for palpitations and leg swelling.       Burning pain  Gastrointestinal: Negative for nausea, vomiting, abdominal pain, diarrhea, constipation and abdominal distention.  Genitourinary: Negative for urgency, frequency and difficulty urinating.  Musculoskeletal: Negative for arthralgias, back pain, myalgias and neck pain.  Skin: Negative for rash.  Neurological: Negative for dizziness, seizures, syncope, weakness, numbness and headaches.      Objective:   Physical Exam  Nursing note and vitals reviewed. Constitutional: He appears well-developed and well-nourished. No distress.  HENT:  Head: Normocephalic and atraumatic.  Mouth/Throat: Oropharynx is clear and moist. No oropharyngeal exudate.  Eyes: Conjunctivae are normal. Right eye exhibits no discharge. Left eye exhibits no discharge. No scleral icterus.  Neck: Normal range of motion. Neck supple.  Cardiovascular: Normal rate, regular rhythm and normal heart sounds.  Exam reveals no gallop and no friction rub.   No murmur heard. Pulmonary/Chest: Effort normal and breath sounds normal. No stridor. No respiratory distress. He has no wheezes. He has no rales.  Abdominal: Soft. Bowel sounds are normal. He exhibits no distension. There is no tenderness. There is no rebound and no guarding.  Musculoskeletal: Normal range of motion. He exhibits no edema and no tenderness.  Neurological: He is alert. He exhibits normal muscle tone.    Skin: Skin is warm and dry. He is not diaphoretic. No erythema.     6 mm seborrheic keratosis under left eye  Psychiatric: He has a normal mood and affect.      Assessment & Plan:   Please see problem oriented charting.

## 2013-05-24 NOTE — Assessment & Plan Note (Signed)
A basic metabolic panel was obtained at this visit to assure stability of his electrolytes. It is pending at the time of this dictation. When available any necessary changes to his regimen will be instituted.

## 2013-05-24 NOTE — Assessment & Plan Note (Signed)
Darren Mcbride and his mother note a several month history of a hoarse voice.  He does admit to feeling heartburn consistent with gastroesophageal reflux disease. He has never been on therapy for this symptomatic reflux. It should also be remembered he has a history of allergic rhinitis that has not responded well to oral antihistamine therapy. That being said, he denies a sensation of a postnasal drip at this time. His oropharynx was without erythema, cobblestoning, or exudates. Given the symptomatic reflux we will start omeprazole 20 mg by mouth daily for 3 months. I explained to the mother and Darren Mcbride that it may take up to 3 months for his dysphonia to improve if it is secondary to the reflux. He will return in 3 months for reevaluation of his hoarseness to see if it has responded to the omeprazole therapy. If it has not I will reassess for the possibility of the hoarseness being related to a chronic postnasal drip but if that is unlikely, I will refer him to the ear nose and throat physician for evaluation of his vocal cords.

## 2013-05-24 NOTE — Assessment & Plan Note (Addendum)
He received his flu shot today. Because he has to maternal uncles with a history of prostate cancer a PSA was obtained. It is pending at the time of this dictation.

## 2013-05-27 NOTE — Progress Notes (Signed)
BMet: HCO3 35, eGFR > 89  Total cholesterol 125 Triglycerides 43 HDL 47 LDL 69  PSA 0.55  Alkalosis likely secondary to the fludrocortisone, fortunately, it is not associated with hypokalemia. We will continue management of his chronic medical conditions as documented.

## 2013-06-17 ENCOUNTER — Ambulatory Visit (INDEPENDENT_AMBULATORY_CARE_PROVIDER_SITE_OTHER): Payer: Medicare Other | Admitting: Podiatry

## 2013-06-17 ENCOUNTER — Encounter: Payer: Self-pay | Admitting: Podiatry

## 2013-06-17 VITALS — BP 139/71 | HR 87 | Resp 18

## 2013-06-17 DIAGNOSIS — M79609 Pain in unspecified limb: Secondary | ICD-10-CM

## 2013-06-17 DIAGNOSIS — B351 Tinea unguium: Secondary | ICD-10-CM

## 2013-06-17 NOTE — Progress Notes (Signed)
°  Subjective:    Patient ID: Darren Mcbride, male    DOB: 25-Sep-1960, 52 y.o.   MRN: [id number] Trim my nails HPI this autistic 52 year old black male presents for ongoing debridement of painful mycotic toenails at three-month intervals.    Review of Systems     Objective:   Physical Exam Objective: Hypertrophic elongated toenails with texture color changes x10 with palpable tenderness in all nail plates.      Assessment & Plan:   Assessment: Symptomatic onychomycoses x10  Plan: All 10 toenails are debrided without any bleeding in reappoint at three-month intervals.  Richard C.Leeanne Deed, DPM

## 2013-07-02 ENCOUNTER — Telehealth: Payer: Self-pay | Admitting: Neurology

## 2013-07-02 NOTE — Telephone Encounter (Signed)
Called patient to remind himm of and appt,couldn't reach patient.

## 2013-07-04 ENCOUNTER — Encounter: Payer: Self-pay | Admitting: Neurology

## 2013-07-04 ENCOUNTER — Ambulatory Visit (INDEPENDENT_AMBULATORY_CARE_PROVIDER_SITE_OTHER): Payer: Medicare Other | Admitting: Neurology

## 2013-07-04 ENCOUNTER — Encounter (INDEPENDENT_AMBULATORY_CARE_PROVIDER_SITE_OTHER): Payer: Self-pay

## 2013-07-04 VITALS — BP 133/84 | HR 85 | Ht 68.0 in | Wt 153.0 lb

## 2013-07-04 DIAGNOSIS — F09 Unspecified mental disorder due to known physiological condition: Secondary | ICD-10-CM

## 2013-07-04 DIAGNOSIS — R4182 Altered mental status, unspecified: Secondary | ICD-10-CM

## 2013-07-04 DIAGNOSIS — R4189 Other symptoms and signs involving cognitive functions and awareness: Secondary | ICD-10-CM

## 2013-07-04 NOTE — Progress Notes (Signed)
[facility]  Provider:  Dr Hosie Poisson Referring Provider: Rocco Serene, MD Primary Care Physician:  Rocco Serene, MD  Chief Complaint  Patient presents with  . Follow-up    Room 16  . cognitive decline    MOCHA 24/30   Follow up: Mom notes he continues to have difficulty with processing information, trouble interacting/doing day to day things.Feels it is getting progressively worse. Continues to go to day school but mom is unsure if he is doing his work there. He is watching a lot of TV. Has had lab workup and MRI brain which was unremarkable since his last visit. Was recently evaluated and started on a PPI for reflux which they though might be causing the dysphonia.   Initial visit 02/2013 Darren Mcbride is a pleasant 52 year old African American gentleman with an at birth diagnosis of autism presenting for initial evaluation of cognitive decline. He presents today with his mother who gives the majority of the history. His remote history of autism including the diagnosis is unclear based on history his mother provided. Per the mother he was diagnosed with autism at  birth, he was noted to have a complicated delivery. He spent the first 6 to 7 years of his life at [facility], he then lived with foster family briefly before returning to live with his mother at age 89. He has lived with her since then. He finished school through the ninth grade, but has been going to an autism school for the past 17 years, and the school he does arts crafts math and reading. He is held a few different jobs in the past, he worked at [employer], and and recently worked Advertising account executive.  His mother notes that his cognitive problems started around 5 years ago around the time he was diagnosed with hypothyroidism. She initially noticed he started having trouble with short term memory, trouble following directions, trouble  remembering where things go around the house. She denies any trouble with remote memory. They have noticed increased difficulty in performing ADLs. Initially this was a slowly progressive problem, and the past year she feels it as markedly increased. His teacher at the autism school also has expressed concern that he has began to struggle in school compared to prior performance, he is having more difficulty participating in activities. There have not been any  outbursts agitation or anger issues.  In the past year his mom has also noticed progressive worsening of his voice, described as getting lower and harder to understand. During this time he has complained of a sore throat correlating with the voice change. Mom does feel the voice is gone slightly stronger easier to understand in the past few months. Recently he was found to have difficulty with hearing, secondary to likely cerumen impaction. This has improved with removal of the cerumen.  He remains active, reports he enjoys going to [employer] to read books. Also enjoys comedy shows on TV.   Review of Systems: Out of a complete 14 system review, the patient complains of only the following symptoms, and all other reviewed systems are negative. Other for memory loss difficulty talking confusion  History   Social History  . Marital Status: Single    Spouse Name: N/A    Number of Children: 0  . Years of Education: 9th   Occupational History  . student    Social History Main Topics  . Smoking status: Never  Smoker   . Smokeless tobacco: Never Used  . Alcohol Use: No  . Drug Use: No  . Sexual Activity: No   Other Topics Concern  . Not on file   Social History Narrative   Patient lives with his mother at home.   Patient is left-handed.   Patient drinks 2 cups of soda daily at home.   Patient is attending [employer].          Family History  Problem Relation Age of Onset  . Diabetes Mother   . Hypertension Mother   . Cataracts  Mother   . Breast cancer Mother 55    1985, surgery, chemo and XRT  . Prostate cancer Paternal Uncle   . Schizophrenia Sister   . Diabetes insipidus Sister   . Alcoholism Father   . Alcoholism Brother   . Alcoholism Brother   . Alcoholism Brother   . Congestive Heart Failure Brother   . Colon cancer Neg Hx   . Esophageal cancer Neg Hx   . Rectal cancer Neg Hx   . Stomach cancer Neg Hx     Past Medical History  Diagnosis Date  . Autism spectrum disorder 09/22/2011  . 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 Always had stable electrolytes.    . Hypothyroidism 06/30/2006  . Allergic rhinitis 02/06/2013  . Gastroesophageal reflux disease 05/24/2013  . 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 Varicose veins of lower extremities  02/06/2013  . Bilateral cataracts     s/p bilateral extraction    Past Surgical History  Procedure Laterality Date  . Hernia repair      @ birth  . Eye surgery      Bilateral cataract extraction  . Colon polyps  3-18=14    removal of colon growths"colon polyps"  . Colonoscopy with propofol N/A 11/08/2012    Procedure: COLONOSCOPY WITH PROPOFOL;  Surgeon: Rachael Fee, MD;  Location: [facility];  Service: Endoscopy;  Laterality: N/A;    Current Outpatient Prescriptions  Medication Sig Dispense Refill  . fludrocortisone (FLORINEF) 0.1 MG tablet Take 1 tablet (0.1 mg total) by mouth daily.  90 tablet  3  . hydrocortisone (CORTEF) 20 MG tablet Take 1 tablet (20 mg total) by mouth daily.  90 tablet  3  . levothyroxine (SYNTHROID, LEVOTHROID) 88 MCG tablet Take 1 tablet (88 mcg total) by mouth daily.  90 tablet  3  . Multiple Vitamin (MULTIVITAMIN) capsule Take 1 capsule by mouth daily.        Marland Kitchen omeprazole (PRILOSEC) 20 MG capsule Take 1 capsule (20 mg total) by mouth daily.  90 capsule  3    No current facility-administered medications for this visit.    Allergies as of 07/04/2013  . (No Known Allergies)    Vitals: BP 133/84  Pulse 85  Ht 5\' 8"  (1.727 m)  Wt 153 lb (69.4 kg)  BMI 23.27 kg/m2 Last Weight:  Wt Readings from Last 1 Encounters:  07/04/13 153 lb (69.4 kg)   Last Height:   Ht Readings from Last 1 Encounters:  07/04/13 5\' 8"  (1.727 m)     Physical exam: Exam: Gen: NAD, conversant Eyes: anicteric sclerae, moist conjunctivae, temporal deviation of OD HENT: Atraumatic Neck: Trachea midline; supple,  Lungs: CTA, no wheezing, rales, rhonic  CV: RRR, no MRG Abdomen: Soft, non-tender;  Extremities: No peripheral edema  Skin: Normal temperature, no rash,  Psych: Appropriate affect, pleasant  Neuro: MS: , appropriately interactive, blunted affect   Speech: low, hoarse dysphonic speech, difficult to understand At prior visit MOCA 27/30 (26 +1 for low education) -1 animal naming -3 delayed recall  Negative Glabellar, snout, palmomental reflex  CN: PERRL, EOMI no nystagmus, lateral deviation OD, , sensation intact to LT V1-V3 bilat, face symmetric, no weakness, hearing grossly intact, palate elevates symmetrically, shoulder shrug 5/5 bilat,  tongue protrudes midline, no fasiculations noted.  Motor: normal bulk and tone Strength: 5/5  In all extremities  Coord: rapid alternating and point-to-point (FNF, HTS) movements intact. Mild bilat intention tremor, no noted resting, wing-beating tremor  Reflexes: symmetrical, bilat downgoing toes  Sens: LT intact in all extremities  Gait: stands without assistance, normal base, stooped posture, no shuffling steps, holds arms flexed, no instability noted   Assessment:  After physical and neurologic examination, review of laboratory studies, imaging, neurophysiology testing and pre-existing records, assessment will be reviewed on the problem list.  Plan:  Treatment plan and  additional workup will be reviewed under Problem List.  Darren Mcbride is a pleasant 52 year old gentleman with a diagnosis of autism since birth presenting with his mother for followup evaluation of progressive cognitive decline over the past 5 years. Since last visit has a normal brain MRI and lab workup. Was recently started on a proton pump inhibitor for possible GERD, did not notice a change in his voice with this medication.  His physical exam was mostly unremarkable, at prior visit his MOCA cognitive testing showed a score of 27/30 which is within normal range.The MOCA deficiencies were in language fluency and delayed recall. The etiology of this subjective cognitive decline is unclear at this point. Based on his age and temperature in she is relatively young for a diagnosis of dementia of Alzheimer's type. Therefore we need to rule out potentially reversible metabolic and structural causes. The differential would also include depression and/or and other mood disorder, but this will be a diagnosis of exclusion pending workup for a neurological cause.  -referral to neuro-psych for formal cognitive testing to better determine underlying cognitive defecits -EEG -will consider more formal evaluation for possible depression mimicking depression at next visit -ENT evaluation  Follow up in 4 months

## 2013-07-04 NOTE — Patient Instructions (Signed)
Overall you are doing fairly well but I do want to suggest a few things today:   Remember to drink plenty of fluid, eat healthy meals and do not skip any meals. Try to eat protein with a every meal and eat a healthy snack such as fruit or nuts in between meals. Try to keep a regular sleep-wake schedule and try to exercise daily, particularly in the form of walking, 20-30 minutes a day, if you can.   As far as diagnostic testing: 1)ENT referral 2)Referral for formal cognitive testing  3)EEG   I would like to see you back in 4 months, sooner if we need to. Please call us with any interim questions, concerns, problems, updates or refill requests.   Please also call us for any test results so we can go over those with you on the phone.  My clinical assistant and will answer any of your questions and relay your messages to me and also relay most of my messages to you.   Our phone number is [phone]. We also have an after hours call service for urgent matters and there is a physician on-call for urgent questions. For any emergencies you know to call 911 or go to the nearest emergency room

## 2013-07-17 ENCOUNTER — Ambulatory Visit (INDEPENDENT_AMBULATORY_CARE_PROVIDER_SITE_OTHER): Payer: Medicare Other | Admitting: Radiology

## 2013-07-17 DIAGNOSIS — R4189 Other symptoms and signs involving cognitive functions and awareness: Secondary | ICD-10-CM

## 2013-07-17 DIAGNOSIS — R4182 Altered mental status, unspecified: Secondary | ICD-10-CM

## 2013-07-18 NOTE — Procedures (Signed)
    History:   Darren Mcbride is a 52 year old gentleman with a history of autism, with some decline in his cognitive functioning. MRI of the brain has been unremarkable. The patient is being evaluated for the alteration in his cognitive status.  This is a routine EEG. No skull defects are noted. Medications include Florinef, hydrocortisone, Synthroid, multiple vitamins, and Prilosec.  EEG classification: Normal awake and drowsy  Description of the recording: The background rhythms of this recording consists of a fairly well modulated medium amplitude alpha rhythm of 9 Hz that is reactive to eye opening and closure. As the record progresses, the patient appears to remain in the waking state throughout the recording. Photic stimulation was performed, resulting in a bilateral and symmetric photic driving response. Hyperventilation was not performed. Toward the end of the recording, the patient enters the drowsy state with slight symmetric slowing seen. The patient never enters stage II sleep. At no time during the recording does there appear to be evidence of spike or spike wave discharges or evidence of focal slowing. EKG monitor shows no evidence of cardiac rhythm abnormalities with a heart rate of 72.  Impression: This is a normal EEG recording in the waking and drowsy state. No evidence of ictal or interictal discharges are seen.

## 2013-08-30 ENCOUNTER — Encounter: Payer: Self-pay | Admitting: Licensed Clinical Social Worker

## 2013-08-30 ENCOUNTER — Telehealth: Payer: Self-pay | Admitting: *Deleted

## 2013-08-30 ENCOUNTER — Ambulatory Visit (INDEPENDENT_AMBULATORY_CARE_PROVIDER_SITE_OTHER): Payer: Medicare Other | Admitting: Internal Medicine

## 2013-08-30 ENCOUNTER — Encounter: Payer: Self-pay | Admitting: Internal Medicine

## 2013-08-30 VITALS — BP 134/85 | HR 97 | Temp 97.2°F | Wt 157.5 lb

## 2013-08-30 DIAGNOSIS — K219 Gastro-esophageal reflux disease without esophagitis: Secondary | ICD-10-CM

## 2013-08-30 DIAGNOSIS — E2740 Unspecified adrenocortical insufficiency: Secondary | ICD-10-CM

## 2013-08-30 DIAGNOSIS — Z0000 Encounter for general adult medical examination without abnormal findings: Secondary | ICD-10-CM

## 2013-08-30 DIAGNOSIS — E039 Hypothyroidism, unspecified: Secondary | ICD-10-CM

## 2013-08-30 DIAGNOSIS — J309 Allergic rhinitis, unspecified: Secondary | ICD-10-CM

## 2013-08-30 DIAGNOSIS — R4189 Other symptoms and signs involving cognitive functions and awareness: Secondary | ICD-10-CM

## 2013-08-30 DIAGNOSIS — E2749 Other adrenocortical insufficiency: Secondary | ICD-10-CM

## 2013-08-30 DIAGNOSIS — B351 Tinea unguium: Secondary | ICD-10-CM

## 2013-08-30 DIAGNOSIS — F840 Autistic disorder: Secondary | ICD-10-CM

## 2013-08-30 DIAGNOSIS — R490 Dysphonia: Secondary | ICD-10-CM

## 2013-08-30 DIAGNOSIS — R4182 Altered mental status, unspecified: Secondary | ICD-10-CM

## 2013-08-30 HISTORY — DX: Tinea unguium: B35.1

## 2013-08-30 NOTE — Assessment & Plan Note (Signed)
He is up-to-date on knowledge his preventative healthcare.

## 2013-08-30 NOTE — Assessment & Plan Note (Signed)
He currently denies sinus congestion, frontal headaches, nasal discharge, or sensation of postnasal drip. Although his hoarseness may be from a symptomatic chronic postnasal drip we will avoid any medications at this time that could adversely affect his cognition.

## 2013-08-30 NOTE — Patient Instructions (Addendum)
It was nice to see you again.  I am sorry you are not doing as well.  1) We checked a thyroid test today to make sure it is OK.  2) Please make your appointment for special testing that has already been discussed.  3) We will call to follow-up on the throat specialist referral and try to get that scheduled.  4) We will work on the paperwork to see if we can get you some help at home for bathing.  5) Keep taking all of your medications as you are.  I will see you back in 6 months, sooner if necessary.

## 2013-08-30 NOTE — Assessment & Plan Note (Signed)
He continues to followup with podiatry to manage his onychomycoses of his toenails every 3 months.

## 2013-08-30 NOTE — Progress Notes (Signed)
Darren Mcbride presents to [facility] today along with his mother, Darren Mcbride.  Darren Mcbride is pt's primary caregiver.  Mother states she has yet to receives services from [facility] of choice, [employer], [employer].  CSW placed call to [facility] to confirm initial receipt of request and if another request was needed.  CSW notified [facility] had accepted request, however upon calling [facility] they are unable to accept additional PCS.  CSW discussed option of choosing another PCS agency with mother.  Listing provided and mother has no preference of agency.  CSW provided brochure on [facility].  Placed call to [facility] in room with pt and mother.  Mother spoke directly to [facility] notifying of request to change provider.  Mother states [employer] will follow up in 24 hrs.  CSW provided Darren Mcbride with contact information.  Pt and family aware CSW is able to assist as needed.

## 2013-08-30 NOTE — Assessment & Plan Note (Signed)
An ENT referral was made by neurology in November but seems to fallen through the cracks. We will try to get this evaluation arranged to make sure we are not missing something specific to the vocal cords given the rather chronic nature of his hoarseness.

## 2013-08-30 NOTE — Progress Notes (Signed)
   Subjective:    Patient ID: Darren Mcbride, male    DOB: 04-26-1961, 53 y.o.   MRN: [id number]  HPI  Please see the A&P for the status of the pt's chronic medical problems.  Review of Systems  Constitutional: Negative for activity change, appetite change and unexpected weight change.  HENT: Positive for voice change. Negative for congestion, postnasal drip, rhinorrhea, sinus pressure, sneezing and sore throat.        Persistent hoarseness despite compliance with PPI therapy.  Respiratory: Negative for shortness of breath.   Cardiovascular: Negative for chest pain, palpitations and leg swelling.  Gastrointestinal: Negative for abdominal pain and abdominal distention.  Genitourinary: Positive for genital sores. Negative for discharge, penile swelling and penile pain.  Musculoskeletal: Negative for arthralgias, back pain, gait problem and myalgias.  Skin: Negative for color change, rash and wound.  Psychiatric/Behavioral: Negative for sleep disturbance.      Objective:   Physical Exam  Nursing note and vitals reviewed. Constitutional: He is oriented to person, place, and time. He appears well-developed and well-nourished. No distress.  HENT:  Head: Normocephalic and atraumatic.  Eyes: Conjunctivae are normal. Right eye exhibits no discharge. Left eye exhibits no discharge. No scleral icterus.  Genitourinary: Penis normal. No penile tenderness.  Mild hypopigmentation at base of glans.  No other sores seen.  Musculoskeletal: Normal range of motion. He exhibits no edema.  Neurological: He is alert and oriented to person, place, and time. He exhibits normal muscle tone.  Skin: Skin is warm and dry. No rash noted. He is not diaphoretic. No erythema. No pallor.      Assessment & Plan:   Please see problem oriented charting.

## 2013-08-30 NOTE — Telephone Encounter (Signed)
Call made to [facility] to follow up ENT referral.  Because pt has [employer], the referral must be from his PCP.  Informed [facility] that I will forward the referral to ENT. New referral placed for ENT, will forward to PCP to make him aware. Phone call complete.Despina Hidden Cassady1/16/201511:48 AM

## 2013-08-30 NOTE — Assessment & Plan Note (Signed)
His mother notes that he is much less interactive over the last several months. He does not respond to all of her questions, he is more difficult to understand, likes to watch TV all day, but does continue to very much enjoy school, church, and Sunday school. His mother has asked his teacher how he is performing in school and has been told he is doing less well in terms of progression. Mr. Davies denies depression or sadness. He states he is sleeping well. He is without any specific complaints. He was recently seen by neurology who has scheduled neurocognitive testing to assess for possible neurologic causes of his decline. I will also check his thyroid level today to make sure that we are within the target range for his TSH in case this could be contributing to his slow cognitive decline. With the neurocognitive testing and TSH we may have a direction in which to pursue. Otherwise, it is possible this may be progression of his underlying disorder.

## 2013-08-30 NOTE — Assessment & Plan Note (Signed)
With his slow cognitive decline we will check a TSH level to make sure he is within the target range on his Synthroid at 88 mcg by mouth every morning. He is compliant with his medication and it will be continued at this dose pending the results of this morning's TSH level.

## 2013-08-30 NOTE — Telephone Encounter (Signed)
Appt scheduled for Thurs Jan. 22nd @ 1:20pm (1pm arrival) with Dr Redmond Baseman w/[facility] located at [address], [address] [phone].  Records to be faxed to [phone].  Pt's mother is aware.Despina Hidden Cassady1/16/20152:50 PM

## 2013-08-30 NOTE — Assessment & Plan Note (Signed)
He continues to take fludrocortisone and hydrocortisone. Currently there are no complaints associated with this issue and we will continue this therapy.

## 2013-08-30 NOTE — Assessment & Plan Note (Signed)
Although his hoarseness persists he does admit that his reflux symptoms have resolved on the omeprazole. We will therefore continue the omeprazole at 20 mg by mouth daily pending his ENT evaluation. If it does not suggest reflux as a cause, we may consider a switch to an H2 blocker at the followup visit.

## 2013-08-31 LAB — TSH: TSH: 3.372 u[IU]/mL (ref 0.350–4.500)

## 2013-09-05 NOTE — Progress Notes (Signed)
TSH 3.372  TSH within therapeutic range, thus under or over treatment of his hypothyroidism is not the cause of his declining mental functioning.

## 2013-09-09 ENCOUNTER — Encounter: Payer: Self-pay | Admitting: Podiatry

## 2013-09-09 ENCOUNTER — Ambulatory Visit (INDEPENDENT_AMBULATORY_CARE_PROVIDER_SITE_OTHER): Payer: Medicare Other | Admitting: Podiatry

## 2013-09-09 ENCOUNTER — Encounter: Payer: Self-pay | Admitting: Internal Medicine

## 2013-09-09 VITALS — BP 134/79 | HR 107 | Resp 18

## 2013-09-09 DIAGNOSIS — B351 Tinea unguium: Secondary | ICD-10-CM

## 2013-09-09 DIAGNOSIS — M79609 Pain in unspecified limb: Secondary | ICD-10-CM

## 2013-09-09 NOTE — Progress Notes (Signed)
   Subjective:    Patient ID: Lynton Crescenzo, male    DOB: [birth year], 53 y.o.   MRN: [id number]  HPI I just need a good trim of my nails  Subjective:   Patient ID: Waleed Dettman, male DOB: [birth year], 53 y.o. MRN: [id number]  Trim my nails  HPI this autistic 53year-old black male presents for ongoing debridement of painful mycotic toenails at three-month intervals.  Review of Systems  Objective:   Physical Exam  Objective: Hypertrophic elongated toenails with texture color changes x10 with palpable tenderness in all nail plates.  Assessment & Plan:   Assessment: Symptomatic onychomycoses x10  Plan: All 10 toenails are debrided without any bleeding in reappoint at three-month intervals.   Review of Systems     Objective:   Physical Exam        Assessment & Plan:

## 2013-09-13 ENCOUNTER — Other Ambulatory Visit: Payer: Self-pay | Admitting: Otolaryngology

## 2013-09-16 ENCOUNTER — Emergency Department (INDEPENDENT_AMBULATORY_CARE_PROVIDER_SITE_OTHER)
Admission: EM | Admit: 2013-09-16 | Discharge: 2013-09-16 | Disposition: A | Discharge status: Home / Self Care | Payer: Medicare Other | Source: Home / Self Care | Attending: Family Medicine | Admitting: Family Medicine

## 2013-09-16 ENCOUNTER — Encounter (HOSPITAL_COMMUNITY): Payer: Self-pay | Admitting: Emergency Medicine

## 2013-09-16 DIAGNOSIS — L989 Disorder of the skin and subcutaneous tissue, unspecified: Secondary | ICD-10-CM

## 2013-09-16 NOTE — ED Notes (Signed)
Puncture wound to right palm, unknown date; discovered when he went in to have growth removed from throat 1 week ago

## 2013-09-16 NOTE — Discharge Instructions (Signed)
Please take your son to his follow up appointment at the [facility] for evaluation of the skin lesion on his right hand on 09/18/2013 @ 10:00am.

## 2013-09-16 NOTE — ED Provider Notes (Signed)
CSN: [id number]     Arrival date & time 09/16/13  0945 History   First MD Initiated Contact with Patient 09/16/13 1002     Chief Complaint  Patient presents with  . Puncture Wound   (Consider location/radiation/quality/duration/timing/severity/associated sxs/prior Treatment) HPI Comments: Mother states that she noticed skin lesion of right palm one week ago. Patient is limited historian secondary to moderate autism and cannot elaborate on how long lesion has been present or if recent or remote injury has occurred.  Patient has significant developmental delay and is cared for by his mother.   The history is provided by a parent.    Past Medical History  Diagnosis Date  . Autism spectrum disorder 09/22/2011  . 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 Always had stable electrolytes.    . Hypothyroidism 06/30/2006  . Allergic rhinitis 02/06/2013  . Gastroesophageal reflux disease 05/24/2013  . 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 Varicose veins of lower extremities  02/06/2013  . Bilateral cataracts     s/p bilateral extraction  . Onychomycosis of toenail 08/30/2013   Past Surgical History  Procedure Laterality Date  . Hernia repair      @ birth  . Eye surgery      Bilateral cataract extraction  . Colon polyps  3-18=14    removal of colon growths"colon polyps"  . Colonoscopy with propofol N/A 11/08/2012    Procedure: COLONOSCOPY WITH PROPOFOL;  Surgeon: Milus Banister, MD;  Location: [facility];  Service: Endoscopy;  Laterality: N/A;   Family History  Problem Relation Age of Onset  . Diabetes Mother   . Hypertension Mother   . Cataracts Mother   . Breast cancer Mother 46    1985, surgery, chemo and XRT  . Prostate cancer Paternal Uncle   . Schizophrenia Sister   . Diabetes insipidus Sister   . Alcoholism  Father   . Alcoholism Brother   . Alcoholism Brother   . Alcoholism Brother   . Congestive Heart Failure Brother   . Colon cancer Neg Hx   . Esophageal cancer Neg Hx   . Rectal cancer Neg Hx   . Stomach cancer Neg Hx    History  Substance Use Topics  . Smoking status: Never Smoker   . Smokeless tobacco: Never Used  . Alcohol Use: No    Review of Systems  Unable to perform ROS   Allergies  Review of patient's allergies indicates no known allergies.  Home Medications   Current Outpatient Rx  Name  Route  Sig  Dispense  Refill  . fludrocortisone (FLORINEF) 0.1 MG tablet   Oral   Take 1 tablet (0.1 mg total) by mouth daily.   90 tablet   3   . hydrocortisone (CORTEF) 20 MG tablet   Oral   Take 1 tablet (20 mg total) by mouth daily.   90 tablet   3   . levothyroxine (SYNTHROID, LEVOTHROID) 88 MCG tablet   Oral   Take 1 tablet (88 mcg total) by mouth daily.   90 tablet   3   . Multiple Vitamin (MULTIVITAMIN) capsule   Oral   Take 1 capsule by mouth daily.           Marland Kitchen omeprazole (PRILOSEC) 20 MG capsule   Oral   Take 1 capsule (20 mg total) by mouth daily.  90 capsule   3    BP 127/82  Temp(Src) 98.5 F (36.9 C) (Oral)  Resp 14 Physical Exam  Nursing note and vitals reviewed. Constitutional: He appears well-developed and well-nourished. No distress.  Eyes: Conjunctivae are normal. No scleral icterus.  Cardiovascular: Normal rate.   Pulmonary/Chest: Effort normal.  Neurological: He is alert.  Skin: Skin is warm and dry.  1.2 cm in diameter annular hyperpigmented macular lesion with dried cracked skin on surface and irregular distribution of pigment at center of right palm.    ED Course  Procedures (including critical care time) Labs Review Labs Reviewed - No data to display Imaging Review No results found.    MDM  The appearance of this lesions suggests that it has been present for much longer that one week. No evidence of injury or foreign  body. Size and irregularity of pigment suggest the need for biopsy. Contacted patient's PCP [facility]) and arranged follow up appointment for 09/18/2013 @ 10:00am for evaluation.    [place], [place] 09/16/13 1020

## 2013-09-18 ENCOUNTER — Encounter: Payer: Self-pay | Admitting: Internal Medicine

## 2013-09-18 ENCOUNTER — Ambulatory Visit (INDEPENDENT_AMBULATORY_CARE_PROVIDER_SITE_OTHER): Payer: Medicare Other | Admitting: Internal Medicine

## 2013-09-18 ENCOUNTER — Other Ambulatory Visit: Payer: Self-pay | Admitting: Otolaryngology

## 2013-09-18 VITALS — BP 130/84 | HR 95 | Temp 97.6°F | Ht 68.0 in | Wt 154.5 lb

## 2013-09-18 DIAGNOSIS — L989 Disorder of the skin and subcutaneous tissue, unspecified: Secondary | ICD-10-CM

## 2013-09-18 DIAGNOSIS — C320 Malignant neoplasm of glottis: Secondary | ICD-10-CM

## 2013-09-18 NOTE — Progress Notes (Signed)
Patient ID: Darren Mcbride, male   DOB: Nov 06, 1960, 53 y.o.   MRN: [id number] Subjective:   Patient ID: Darren Mcbride male   DOB: 08-09-1961 53 y.o.   MRN: [id number]  CC:  ED follow up visit.    HPI:  [name] is a 53 y.o. man with past medical history as outlined below, who presents with daily followup visit  Because of autism, patient does not talk much, history was obtained from a patient's mother.  Mother states that she noticed a skin lesion in his right palm one week ago, but guessed that the lesion could be there for more than 3 weeks. The lesion is dark brown in color, slightly elevated from skin, approximately 0.5 x 0.8 cm in size. It is dry and cracked. No tenderness, burning or itches. It could be due to pencil sharpener use per his mother, but not very sure. He visited ED 2 days ago, and advised to folllow up with PCP. Of note, patient had vocal cord biopsy by Dr. Redmond Baseman due to hoarseness on 09/13/13, which showed invasive squamous cancer. Patient has follow up appointment with Dr. Redmond Baseman next week. Has a history of adrenal insufficiency, currently is on Cortef and Florinef. Today his blood pressure is normal at 130/84. Patient also has history of hypothyroidism, currently he is taking Synthroid 88 mcg daily. His recent TSH was 3.372 on 08/30/13.  ROS:  Denies fever, chills, fatigue, headaches, cough, chest pain, SOB,  abdominal pain, diarrhea, constipation, dysuria, urgency, frequency, hematuria, joint pain or leg swelling.   Past Medical History  Diagnosis Date  . Autism spectrum disorder 09/22/2011  . 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 Always had stable electrolytes.    . Hypothyroidism 06/30/2006  . Allergic rhinitis 02/06/2013  . Gastroesophageal reflux disease 05/24/2013  . 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 Varicose veins of lower extremities  02/06/2013  . Bilateral cataracts     s/p bilateral extraction  . Onychomycosis of toenail 08/30/2013   Current Outpatient Prescriptions  Medication Sig Dispense Refill  . fludrocortisone (FLORINEF) 0.1 MG tablet Take 1 tablet (0.1 mg total) by mouth daily.  90 tablet  3  . hydrocortisone (CORTEF) 20 MG tablet Take 1 tablet (20 mg total) by mouth daily.  90 tablet  3  . levothyroxine (SYNTHROID, LEVOTHROID) 88 MCG tablet Take 1 tablet (88 mcg total) by mouth daily.  90 tablet  3  . Multiple Vitamin (MULTIVITAMIN) capsule Take 1 capsule by mouth daily.        Marland Kitchen omeprazole (PRILOSEC) 20 MG capsule Take 1 capsule (20 mg total) by mouth daily.  90 capsule  3   No current facility-administered medications for this visit.   Family History  Problem Relation Age of Onset  . Diabetes Mother   . Hypertension Mother   . Cataracts Mother   . Breast cancer Mother 60    1985, surgery, chemo and XRT  . Prostate cancer Paternal Uncle   . Schizophrenia Sister   . Diabetes insipidus Sister   . Alcoholism Father   . Alcoholism Brother   . Alcoholism Brother   . Alcoholism Brother   . Congestive Heart Failure Brother   . Colon cancer Neg Hx   . Esophageal cancer Neg Hx   . Rectal cancer Neg Hx   . Stomach cancer Neg Hx  History   Social History  . Marital Status: Single    Spouse Name: N/A    Number of Children: 0  . Years of Education: 9th   Occupational History  . student    Social History Main Topics  . Smoking status: Never Smoker   . Smokeless tobacco: Never Used  . Alcohol Use: No  . Drug Use: No  . Sexual Activity: No   Other Topics Concern  . None   Social History Narrative   Patient lives with his mother at home.   Patient is left-handed.   Patient drinks 2 cups of soda daily at home.   Patient is attending [employer].         Review of Systems: Full 14-point review of systems otherwise negative. See  HPI.  Objective:  Physical Exam: Filed Vitals:   09/18/13 1022  BP: 130/84  Pulse: 95  Temp: 97.6 F (36.4 C)  TempSrc: Oral  Height: 5\' 8"  (1.727 m)  Weight: 154 lb 8 oz (70.081 kg)  SpO2: 98%   Constitutional: due to autism, does not answer question clearly.  HEENT:  Head: Normocephalic and atraumatic Mouth: no erythema or exudates, MMM Eyes: PERRL, EOMI, conjunctivae normal, No scleral icterus.  Neck: Supple, Trachea midline normal ROM, No JVD  Cardiovascular: RRR, S1 normal, S2 normal, no MRG, pulses symmetric and intact bilaterally Pulmonary/Chest: CTAB, no wheezes, rales, or rhonchi Abdominal: Soft. Non-tender, non-distended, bowel sounds are normal, no masses, organomegaly, or guarding present.  GU: no CVA tenderness Musculoskeletal: No joint deformities, erythema, or stiffness, ROM full and non-tender Extremities: No leg edema Hematology: no cervical, inginal, or axillary adenopathy.  Neurological: A&O x3, Strength is normal and symmetric bilaterally, cranial nerve II-XII are grossly intact, no focal motor deficit, sensory intact to light touch bilaterally.  Skin: There is a dark brown Lesion in right palm, slightly elevated from skin, approximately 0.5 x 0.8 cm in size. It is dry and cracked. Margin is clear.      Assessment & Plan:

## 2013-09-18 NOTE — Assessment & Plan Note (Signed)
Etiology is not clear at this moment. The timeline is also not very clear. Patient may need skin biopsy to make an accurate diagnosis. Given the complexity of the palm structure, will not do the biopsy in clinic.  -will give referral to dermatologist.

## 2013-09-18 NOTE — Patient Instructions (Signed)
1. We will give you referral to dermatologist, please followup appointment. 2. Please take all medications as prescribed.  3. If you have worsening of your symptoms or new symptoms arise, please call the clinic [phone]), or go to the ER immediately if symptoms are severe.  Please bring in all your medication bottles with you in next visit.

## 2013-09-19 NOTE — ED Provider Notes (Signed)
Medical screening examination/treatment/procedure(s) were performed by a resident physician or non-physician practitioner and as the supervising physician I was immediately available for consultation/collaboration.  Lynne Leader, MD    Gregor Hams, MD 09/19/13 [phone]

## 2013-09-19 NOTE — Progress Notes (Signed)
Case discussed with Dr. Niu soon after the resident saw the patient.  We reviewed the resident's history and exam and pertinent patient test results.  I agree with the assessment, diagnosis, and plan of care documented in the resident's note. 

## 2013-09-25 ENCOUNTER — Ambulatory Visit
Admission: RE | Admit: 2013-09-25 | Discharge: 2013-09-25 | Disposition: A | Discharge status: Home / Self Care | Payer: Medicare Other | Source: Ambulatory Visit | Attending: Otolaryngology | Admitting: Otolaryngology

## 2013-09-25 DIAGNOSIS — C320 Malignant neoplasm of glottis: Secondary | ICD-10-CM

## 2013-09-25 MED ORDER — IOHEXOL 300 MG/ML  SOLN
75.0000 mL | Freq: Once | INTRAMUSCULAR | Status: AC | PRN
  Administered 2013-09-25: 75 mL via INTRAVENOUS

## 2013-10-01 ENCOUNTER — Encounter: Payer: Self-pay | Admitting: Radiation Oncology

## 2013-10-01 NOTE — Progress Notes (Signed)
Head and Neck Cancer Location of Tumor / Histology: Vocal cord  Left mass   Patient presented  months ago with symptoms of: Hoarseness,  Off and on since spring 2014   Biopsies of  (if applicable) revealed: 5/49/82: The tumor cells are negative for p16 (HR HPV marker) with appropriate control.DiagnosisVocal cord, biopsy, mass, left INVASIVE SQUAMOUS CELL CARCINOMA,.Dr. Bonnita Levan  Nutrition Status:  Weight changes: no  Swallowing status: no dysphasia  Plans, if any, for PEG tube: no  Tobacco/Marijuana/Snuff/ETOH use: Non smoker,non tobacco use, no alcohol or illicit drug use  Past/Anticipated interventions by otolaryngology, if any: Dr. Melida Quitter appt seen 09/30/13 recommends radiation txs  And will follow up after treatments   Past/Anticipated interventions by medical oncology, if any: no  Referrals yet, to any of the following?   Social Work? No  Dentistry? No  Swallowing therapy? No  Nutrition? No  Med/Onc? No  PEG placement? NO  SAFETY ISSUES:  Prior radiation? No  Pacemaker/ICD? No  Is the patient on methotrexate? No  Current Complaints / other details:  Single, Autism, hearing loss,  9th grade education  mother giving history , mother breast cancer 1985, chemotherapy and Rad tx , Father deceased age e70, from fall ,brother CHF, sister Schizophrenia,DM

## 2013-10-02 ENCOUNTER — Ambulatory Visit
Admission: RE | Admit: 2013-10-02 | Discharge: 2013-10-02 | Disposition: A | Discharge status: Home / Self Care | Payer: Medicare Other | Source: Ambulatory Visit | Attending: Radiation Oncology | Admitting: Radiation Oncology

## 2013-10-02 ENCOUNTER — Encounter: Payer: Self-pay | Admitting: *Deleted

## 2013-10-02 ENCOUNTER — Encounter: Payer: Self-pay | Admitting: Radiation Oncology

## 2013-10-02 VITALS — BP 147/74 | HR 83 | Temp 98.0°F | Resp 20 | Ht 68.0 in | Wt 159.7 lb

## 2013-10-02 DIAGNOSIS — C320 Malignant neoplasm of glottis: Secondary | ICD-10-CM

## 2013-10-02 DIAGNOSIS — J383 Other diseases of vocal cords: Secondary | ICD-10-CM

## 2013-10-02 DIAGNOSIS — K219 Gastro-esophageal reflux disease without esophagitis: Secondary | ICD-10-CM | POA: Insufficient documentation

## 2013-10-02 DIAGNOSIS — C801 Malignant (primary) neoplasm, unspecified: Secondary | ICD-10-CM

## 2013-10-02 DIAGNOSIS — F840 Autistic disorder: Secondary | ICD-10-CM | POA: Insufficient documentation

## 2013-10-02 DIAGNOSIS — E039 Hypothyroidism, unspecified: Secondary | ICD-10-CM | POA: Insufficient documentation

## 2013-10-02 DIAGNOSIS — Z79899 Other long term (current) drug therapy: Secondary | ICD-10-CM | POA: Insufficient documentation

## 2013-10-02 HISTORY — DX: Malignant (primary) neoplasm, unspecified: C80.1

## 2013-10-02 MED ORDER — LARYNGOSCOPY SOLUTION RAD-ONC
15.0000 mL | Freq: Once | TOPICAL | Status: AC
  Administered 2013-10-02: 15 mL via TOPICAL
  Filled 2013-10-02: qty 15

## 2013-10-02 NOTE — Progress Notes (Signed)
Please see the Nurse Progress Note in the MD Initial Consult Encounter for this patient. 

## 2013-10-02 NOTE — Progress Notes (Signed)
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 male who is seen for an initial consultation visit. The patient was seen accompanied by his mother today. The patient did not answer many questions as he is autistic. His mother provided the history. She indicates that she experience some hoarseness since last weighing. This to some degree was something that was off and on but this persisted and worsened over time. He therefore underwent further evaluation and was sent to Dr. Redmond Baseman for this issue. The patient's mother indicated that other than hoarseness, he also experienced some changes in swallowing. He had difficulty with certain foods. He had lost some weight but he has been gaining this back. Otherwise no new symptoms.  The patient was evaluated by Dr. Redmond Baseman who proceeded to undergo fiberoptic flexible laryngoscopy. He was noted to have a mass on the left anterior vocal cord blocking the glottis less than 50%. The vocal cords moved symmetrically and there was good closure. The patient underwent a biopsy and this returned positive for invasive squamous cell carcinoma. The tumor cells are negative for P. 16.  The patient also had a CT scan of the neck on 09/25/2013. No significant vocal cord asymmetry was noted. No subglottic or supraglottic mass or extension. The patient had some borderline enlarged cervical adenopathy which was felt to be reactive.     PREVIOUS RADIATION THERAPY: No   PAST MEDICAL HISTORY:  has a past medical history of Autism spectrum disorder (09/22/2011); Adrenal insufficiency (09/22/2011); Hypothyroidism (06/30/2006); Allergic rhinitis (02/06/2013);  Gastroesophageal reflux disease (05/24/2013); Tubulovillous adenoma of colon (06/02/2011); Varicose veins of lower extremities  (02/06/2013); Bilateral cataracts; Onychomycosis of toenail (08/30/2013); and Cancer (09/13/13 bx).     PAST SURGICAL HISTORY: Past Surgical History  Procedure Laterality Date  . Hernia repair      @ birth  . Eye surgery      Bilateral cataract extraction  . Colon polyps  3-18=14    removal of colon growths"colon polyps"  . Colonoscopy with propofol N/A 11/08/2012    Procedure: COLONOSCOPY WITH PROPOFOL;  Surgeon: Milus Banister, MD;  Location: [facility];  Service: Endoscopy;  Laterality: N/A;     FAMILY HISTORY: family history includes Alcoholism in his brother, brother, brother, and father; Breast cancer (age of onset: 71) in his mother; Cataracts in his mother; Congestive Heart Failure in his brother; Diabetes in his mother; Diabetes insipidus in his sister; Hypertension in his mother; Prostate cancer in his paternal uncle; Schizophrenia in his sister. There is no history of Colon cancer, Esophageal cancer, Rectal cancer, or Stomach cancer.   SOCIAL HISTORY:  reports that he has never smoked. He has never used smokeless tobacco. He reports that he does not drink alcohol or use illicit drugs.   ALLERGIES: Review of patient's allergies indicates no known allergies.   MEDICATIONS:  Current Outpatient Prescriptions  Medication Sig Dispense Refill  . fludrocortisone (FLORINEF) 0.1 MG tablet Take 1 tablet (0.1 mg total) by mouth daily.  90 tablet  3  . hydrocortisone (CORTEF) 20 MG tablet Take 1 tablet (20 mg total) by mouth daily.  90 tablet  3  . levothyroxine (SYNTHROID, LEVOTHROID)  88 MCG tablet Take 1 tablet (88 mcg total) by mouth daily.  90 tablet  3  . Multiple Vitamin (MULTIVITAMIN) capsule Take 1 capsule by mouth daily.         No current facility-administered medications for this encounter.     REVIEW OF SYSTEMS:  A 15 point review of systems is  documented in the electronic medical record. This was obtained by the nursing staff. However, I reviewed this with the patient to discuss relevant findings and make appropriate changes.  Pertinent items are noted in HPI.    PHYSICAL EXAM:  height is 5\' 8"  (1.727 m) and weight is 159 lb 11.2 oz (72.439 kg). His oral temperature is 98 F (36.7 C). His blood pressure is 147/74 and his pulse is 83. His respiration is 20 and oxygen saturation is 95%.   ECOG = 1  0 - Asymptomatic (Fully active, able to carry on all predisease activities without restriction)  1 - Symptomatic but completely ambulatory (Restricted in physically strenuous activity but ambulatory and able to carry out work of a light or sedentary nature. For example, light housework, office work)  2 - Symptomatic, <50% in bed during the day (Ambulatory and capable of all self care but unable to carry out any work activities. Up and about more than 50% of waking hours)  3 - Symptomatic, >50% in bed, but not bedbound (Capable of only limited self-care, confined to bed or chair 50% or more of waking hours)  4 - Bedbound (Completely disabled. Cannot carry on any self-care. Totally confined to bed or chair)  5 - Death   Eustace Pen MM, Creech RH, Tormey DC, et al. [phone]). "Toxicity and response criteria of the [facility] Group". [place] Oncol. 5 (6): 649-55  General: Well-developed, in no acute distress HEENT: Normocephalic, atraumatic; oral cavity clear Neck: Supple without any lymphadenopathy Cardiovascular: Regular rate and rhythm Respiratory: Clear to auscultation bilaterally GI: Soft, nontender, normal bowel sounds Extremities: No edema present Neuro: No focal deficits Fiberoptic exam: After the use of topical anesthetic, the flexible laryngoscope was passed through the right near. Good visualization was obtained. No lesions or suspicious findings within the hypopharynx, oropharynx or nasopharynx. The patient is  status post what appears to be an excisional biopsy of the tumor on the left. The vocal cords move well bilaterally.     L no bulky tumor remaining.ABORATORY DATA:  Lab Results  Component Value Date   WBC 6.1 02/23/2011   HGB 12.4* 02/23/2011   HCT 41.1 02/23/2011   MCV 78.3 02/23/2011   PLT 117* 02/23/2011   Lab Results  Component Value Date   NA 141 05/24/2013   K 4.1 05/24/2013   CL 104 05/24/2013   CO2 35* 05/24/2013   Lab Results  Component Value Date   ALT 19 03/14/2013   AST 22 03/14/2013   ALKPHOS 61 03/14/2013   BILITOT 0.3 03/14/2013      RADIOGRAPHY: Ct Soft Tissue Neck W Contrast  09/25/2013   CLINICAL DATA:  Vocal cancer, follow-up.  No current complaints.  EXAM: CT NECK WITH CONTRAST  TECHNIQUE: Multidetector CT imaging of the neck was performed using the standard protocol following the bolus administration of intravenous contrast.  CONTRAST:  68mL OMNIPAQUE IOHEXOL 300 MG/ML  SOLN  COMPARISON:  None.  FINDINGS: Suprahyoid neck:  Negative.  Larynx: Negative. No significant vocal cord asymmetry is evident. There is no subglottic extension or supraglottic mass.  Infrahyoid neck:  No masses.  Lymph  nodes: Borderline enlarged but widespread cervical adenopathy, for instance measuring up to 9 mm short axis level IIA left, 6 mm level I bilateral, and 10 mm right level IV. I suspect these do not represent metastatic disease but are reactive. If further investigation is desired recommend short-term three-month follow-up of CT neck without CT chest.  Upper chest/mediastinum:  see chest CT dictation.  Additional: Severe cervicothoracic scoliosis convex left. Cervical spondylosis. No osseous lesions. No sinus disease. Negative intracranial compartment. No significant carotid bifurcation disease.  IMPRESSION: Borderline enlarged adenopathy, likely reactive. In the absence of residual or recurrent abnormality in the larynx, low suspicion for metastatic disease. Short-term followup 3 months if  there is clinical concern.   Electronically Signed   By: Rolla Flatten M.D.   On: 09/25/2013 14:00   Ct Chest W Contrast  09/25/2013   CLINICAL DATA:  Vocal cord cancer.  EXAM: CT CHEST WITH CONTRAST  TECHNIQUE: Multidetector CT imaging of the chest was performed during intravenous contrast administration.  CONTRAST:  58mL OMNIPAQUE IOHEXOL 300 MG/ML  SOLN  COMPARISON:  None.  FINDINGS: There is cardiomegaly. Tortuosity of the thoracic aorta which is normal caliber. Largest diameters 3.3 cm in the ascending aorta. No mediastinal, hilar, or axillary adenopathy. Small right supraclavicular lymph nodes with an index node scratch has small right supraclavicular nodes. Largest is on image 5 with a short axis diameter of 9 mm.  Lungs are clear.  No pulmonary nodules.  No pleural effusions.  Imaging into the upper abdomen shows no acute findings.  Severe rightward thoracic scoliosis.  No acute bony abnormality.  IMPRESSION: Cardiomegaly.  Small and borderline sized right supraclavicular lymph nodes, the largest 9 mm in short axis diameter. Recommend correlation with neck CT performed today.  No acute findings or evidence of metastatic disease within the chest.   Electronically Signed   By: Rolm Baptise M.D.   On: 09/25/2013 12:32       IMPRESSION:  The patient is status post an excision of a T1 A. N0 M0 squamous cell carcinoma involving the left true vocal cord. The patient and his mother discussed possible treatment including surgical options versus radiation treatment and they are interested in radiation. The patient's mother had radiation for breast cancer so she is aware of radiation treatment.  I believe that the patient is an appropriate candidate for definitive radiation treatment for approximately 5-1/2 weeks.  I discussed this with the patient and his mother. We discussed the rationale of this treatment in terms of definitive management for this malignancy. We also discussed the possible side effects and  risks of treatment as well. All of their questions were answered.   PLAN:  The patient will proceed with a simulation in the near future such that we can proceed with treatment planning. The patient is doing well currently, with swallowing having improved sense his biopsy.    I spent 60 minutes face to face with the patient and more than 50% of that time was spent in counseling and/or coordination of care.    ________________________________   Jodelle Gross, MD, PhD

## 2013-10-02 NOTE — Progress Notes (Signed)
[facility] Psychosocial Distress Screening Clinical Social Work  Clinical Social Work was referred by distress screening protocol.  The patient scored a 5 on the Psychosocial Distress Thermometer which indicates moderate distress. Clinical Education officer, museum met with patient and patient's mother in exam room per RN request to assess for distress and other psychosocial needs. Patient's mother/guardian shared that her son is autistic and she serves as his primary caregiver.  Darren Mcbride stated that he enjoys going to school at [employer] and learning how to type.  Patient's mother stated he was unable to go to school at this time due to medical condition and some mental health concerns.  Patient's mother stated patient is treated at [facility].  CSW will follow up with patient/family at a later to review possible resources.     Darren Mcbride, MSW, LCSW, OSW-C Clinical Social Worker [facility] [phone]

## 2013-10-02 NOTE — Progress Notes (Signed)
Patient was scoped right nare by Dr.Moody, Polo Riley did get with patient and mother per our request  3:14 PM

## 2013-10-10 ENCOUNTER — Telehealth: Payer: Self-pay | Admitting: *Deleted

## 2013-10-10 NOTE — Telephone Encounter (Signed)
Error in charting.

## 2013-10-10 NOTE — Telephone Encounter (Signed)
Called pt at home to introduce myself as the nurse navigator that works with Dr. Valere Dross; spoke with his caregiver mother.  I explained my role as a member of the Care Team; provided her my phone number; encouraged her to call me if she has any concerns/questions while her son is being treated at the [facility].  She verbalized understanding.  I indicated that I would be joining them during her son's SIM next Friday morning.  I confirmed her understanding of the 10:00 appt time, and that the [facility] is here at [facility].  She verbalized understanding.  Initiating navigation as L1 patient (new patient) with this encounter.  Gayleen Orem, RN, BSN, [facility] Head & Neck Oncology Navigator [phone]

## 2013-10-14 ENCOUNTER — Encounter: Payer: Self-pay | Admitting: Internal Medicine

## 2013-10-17 ENCOUNTER — Telehealth: Payer: Self-pay | Admitting: *Deleted

## 2013-10-17 NOTE — Addendum Note (Signed)
Addended by: Yvonna Alanis E on: 10/17/2013 07:00 PM   Modules accepted: Orders

## 2013-10-17 NOTE — Telephone Encounter (Signed)
Spoke with patient's mother, confirmed their arrival tomorrow morning for 10:00 SIM.  She stated they will be here at 9:30.  I acknowledged her understanding.  Gayleen Orem, RN, BSN, [facility] Head & Neck Oncology Navigator [phone]

## 2013-10-18 ENCOUNTER — Encounter: Payer: Self-pay | Admitting: *Deleted

## 2013-10-18 ENCOUNTER — Ambulatory Visit
Admission: RE | Admit: 2013-10-18 | Discharge: 2013-10-18 | Disposition: A | Discharge status: Home / Self Care | Payer: Medicare Other | Source: Ambulatory Visit | Attending: Radiation Oncology | Admitting: Radiation Oncology

## 2013-10-18 DIAGNOSIS — Z510 Encounter for antineoplastic radiation therapy: Secondary | ICD-10-CM | POA: Insufficient documentation

## 2013-10-18 DIAGNOSIS — Z79899 Other long term (current) drug therapy: Secondary | ICD-10-CM | POA: Insufficient documentation

## 2013-10-18 DIAGNOSIS — C320 Malignant neoplasm of glottis: Secondary | ICD-10-CM | POA: Insufficient documentation

## 2013-10-18 NOTE — Progress Notes (Signed)
Met with patient and his mother during [facility].  Re-introduced myself as a Health visitor, explained my role as a member of his Care Team.  They verbalized understanding.  Initiating navigation as L1 patient (new patient) with this encounter.  Gayleen Orem, RN, BSN, [facility] Head & Neck Oncology Navigator [phone]

## 2013-10-23 NOTE — Progress Notes (Signed)
  Radiation Oncology         [phone] ________________________________  Name: Darren Mcbride MRN: [id number]  Date: 10/18/2013  DOB: November 24, 1960  SIMULATION AND TREATMENT PLANNING NOTE  DIAGNOSIS:  Squamous cell carcinoma of the vocal cord  Site:  Larynx  NARRATIVE:  The patient was brought to the [facility].  Identity was confirmed.  All relevant records and images related to the planned course of therapy were reviewed.   Written consent to proceed with treatment was confirmed which was freely given after reviewing the details related to the planned course of therapy had been reviewed with the patient.  Then, the patient was set-up in a stable reproducible  supine position for radiation therapy.  CT images were obtained.  Surface markings were placed.    Medically necessary complex treatment device(s) for immobilization:  Customized thermoplastic head cast.   The CT images were loaded into the planning software.  Then the target and avoidance structures were contoured.  Treatment planning then occurred.  The radiation prescription was entered and confirmed.  A total of 2 complex treatment devices were fabricated which relate to the designed radiation treatment fields. Each of these customized fields/ complex treatment devices will be used on a daily basis during the radiation course. I have requested : Isodose Plan.   PLAN:  The patient will receive 63 Gy in 28 fractions.  ________________________________   Jodelle Gross, MD, PhD

## 2013-10-24 ENCOUNTER — Telehealth: Payer: Self-pay | Admitting: *Deleted

## 2013-10-24 NOTE — Telephone Encounter (Signed)
Patient's mother called with question regarding scheduling.  I provided clarification.  Continuing navigation as L1 patient (new patient).  Gayleen Orem, RN, BSN, [facility] Head & Neck Oncology Navigator [phone]

## 2013-10-25 ENCOUNTER — Encounter: Payer: Self-pay | Admitting: *Deleted

## 2013-10-25 ENCOUNTER — Ambulatory Visit
Admission: RE | Admit: 2013-10-25 | Discharge: 2013-10-25 | Disposition: A | Discharge status: Home / Self Care | Payer: Medicare Other | Source: Ambulatory Visit | Attending: Radiation Oncology | Admitting: Radiation Oncology

## 2013-10-28 ENCOUNTER — Encounter: Payer: Self-pay | Admitting: *Deleted

## 2013-10-28 ENCOUNTER — Ambulatory Visit
Admission: RE | Admit: 2013-10-28 | Discharge: 2013-10-28 | Disposition: A | Discharge status: Home / Self Care | Payer: Medicare Other | Source: Ambulatory Visit | Attending: Radiation Oncology | Admitting: Radiation Oncology

## 2013-10-28 DIAGNOSIS — C320 Malignant neoplasm of glottis: Secondary | ICD-10-CM

## 2013-10-28 DIAGNOSIS — F840 Autistic disorder: Secondary | ICD-10-CM | POA: Diagnosis not present

## 2013-10-28 DIAGNOSIS — R413 Other amnesia: Secondary | ICD-10-CM

## 2013-10-28 MED ORDER — BIAFINE EX EMUL
CUTANEOUS | Status: DC | PRN
  Administered 2013-10-28: 10:00:00 via TOPICAL

## 2013-10-28 NOTE — Progress Notes (Signed)
To provide support and encouragement, spoke with patient and his mother prior to 1st RT.  Subjectively, patient calm and not anxious about starting tmts.    Gayleen Orem, RN, BSN, [facility] Head & Neck Oncology Navigator [phone]

## 2013-10-28 NOTE — Progress Notes (Signed)
patient education done, biafine cream, radiation therapy and ypu book, flyer on dry mouth(biotene), ans skin products, given to mother, discussed skin irritation, fatigue, pain, mucositis, throat changes, taste changes, difficulty swallowing , use of dove unscented soap, rinses with 1/4 teaspoon baking soda and  1/8 teaspoon salt in1 cup  warm water ,rinse mouth evey 1-2 hours  When throat becomes sore, no mouthwashes that contain alcohol, sugar fee candy, sip water frequently, keep mouth moist, , soft foods, 5-6 meals daily  May need to do instead of 3 large meals as time goes on, weight loss, need to increase protein in diet, high calories, need for nutrition consult, sees MD weekly/prn, gave my business card to mother, mother gave teach back, wuill continue to review with mom and patient wekly 10:09 AM

## 2013-10-29 ENCOUNTER — Ambulatory Visit
Admission: RE | Admit: 2013-10-29 | Discharge: 2013-10-29 | Disposition: A | Discharge status: Home / Self Care | Payer: Medicare Other | Source: Ambulatory Visit | Attending: Radiation Oncology | Admitting: Radiation Oncology

## 2013-10-29 NOTE — Progress Notes (Signed)
To provide support, encouragement and ongoing education, met with patient and his mother during [employer].  Explained that today's session is part of the planning for tmts that beging next week Monday.  They verbalized understanding.  Continuing navigation as L1 patient (new patient).  Gayleen Orem, RN, BSN, [facility] Navigator [phone]

## 2013-10-30 ENCOUNTER — Ambulatory Visit
Admission: RE | Admit: 2013-10-30 | Discharge: 2013-10-30 | Disposition: A | Discharge status: Home / Self Care | Payer: Medicare Other | Source: Ambulatory Visit | Attending: Radiation Oncology | Admitting: Radiation Oncology

## 2013-10-31 ENCOUNTER — Ambulatory Visit
Admission: RE | Admit: 2013-10-31 | Discharge: 2013-10-31 | Disposition: A | Discharge status: Home / Self Care | Payer: Medicare Other | Source: Ambulatory Visit | Attending: Radiation Oncology | Admitting: Radiation Oncology

## 2013-11-01 ENCOUNTER — Encounter: Payer: Self-pay | Admitting: Pharmacist

## 2013-11-01 ENCOUNTER — Ambulatory Visit
Admission: RE | Admit: 2013-11-01 | Discharge: 2013-11-01 | Disposition: A | Discharge status: Home / Self Care | Payer: Medicare Other | Source: Ambulatory Visit | Attending: Radiation Oncology | Admitting: Radiation Oncology

## 2013-11-01 ENCOUNTER — Encounter: Payer: Self-pay | Admitting: Radiation Oncology

## 2013-11-01 VITALS — BP 131/87 | HR 81 | Temp 97.8°F | Resp 20 | Wt 150.3 lb

## 2013-11-01 DIAGNOSIS — C320 Malignant neoplasm of glottis: Secondary | ICD-10-CM

## 2013-11-01 NOTE — Progress Notes (Signed)
   Department of Radiation Oncology  Phone:  [phone] Fax:        [phone]  Weekly Treatment Note    Name: Darren Mcbride Date: 11/01/2013 MRN: [id number] DOB: 1961-03-09   Current dose: 11.25 Gy  Current fraction: 5   MEDICATIONS: Current Outpatient Prescriptions  Medication Sig Dispense Refill  . fludrocortisone (FLORINEF) 0.1 MG tablet Take 1 tablet (0.1 mg total) by mouth daily.  90 tablet  3  . hydrocortisone (CORTEF) 20 MG tablet Take 1 tablet (20 mg total) by mouth daily.  90 tablet  3  . levothyroxine (SYNTHROID, LEVOTHROID) 88 MCG tablet Take 1 tablet (88 mcg total) by mouth daily.  90 tablet  3  . Multiple Vitamin (MULTIVITAMIN) capsule Take 1 capsule by mouth daily.         No current facility-administered medications for this encounter.     ALLERGIES: Review of patient's allergies indicates no known allergies.   LABORATORY DATA:  Lab Results  Component Value Date   WBC 6.1 02/23/2011   HGB 12.4* 02/23/2011   HCT 41.1 02/23/2011   MCV 78.3 02/23/2011   PLT 117* 02/23/2011   Lab Results  Component Value Date   NA 141 05/24/2013   K 4.1 05/24/2013   CL 104 05/24/2013   CO2 35* 05/24/2013   Lab Results  Component Value Date   ALT 19 03/14/2013   AST 22 03/14/2013   ALKPHOS 61 03/14/2013   BILITOT 0.3 03/14/2013     NARRATIVE: Darren Mcbride was seen today for weekly treatment management. The chart was checked and the patient's films were reviewed. The patient is doing well after having completed his first week of treatment. No complaints or difficulties so far. He is accompanied by his mother today.  PHYSICAL EXAMINATION: weight is 150 lb 4.8 oz (68.176 kg). His oral temperature is 97.8 F (36.6 C). His blood pressure is 131/87 and his pulse is 81. His respiration is 20.      no significant skin reaction  ASSESSMENT: The patient is doing satisfactorily with treatment.  PLAN: We will continue with the patient's radiation treatment as planned.

## 2013-11-01 NOTE — Progress Notes (Signed)
Today, I met with the patient and his mother in the exam room, prior to appointment with Dr. Lisbeth Renshaw.  I introduced myself as a pharmacist at the [facility] and that I was looking at how we may be able to improve prevention and management of mucositis.  I explained mucositis can be a side effect from radiation therapy and signs/symptoms to be aware of. Reiterated good mouth care from the "Radiation Therapy and You" book. Patients mother asked about using mouthwash, and I advised her to use the salt/baking soda solution as opposed to commercial products as these can be alcohol-based and cause more irritation.  Patient's mother told me that patient will never complain of pain (patient is autistic). I advised her that one of ways she may be able to tell he has mouth/throat pain would be to monitor how he is eating.  Will likely start eating less if he has mouth/throat pain. She noted patient "has not been eating like he used to" stating yesterday he had 2 bologna sandwiches but no chips. I also advised her to check patient's mouth for sores/irritation if he will allow. I gave patient's mother my card and office number, to call if they have any questions.  Dicky Doe, PharmD, BCPS Clinical Pharmacist Pager: [phone] 11/01/2013 10:44 AM

## 2013-11-01 NOTE — Progress Notes (Signed)
Weekly rad tx 5 com[pleted larynx, no c/o pain, nausea or difficulty swallowing food/fluids, biafine  Used bid, no c/o pain, mom in with patient 9:47 AM

## 2013-11-04 ENCOUNTER — Ambulatory Visit
Admission: RE | Admit: 2013-11-04 | Discharge: 2013-11-04 | Disposition: A | Discharge status: Home / Self Care | Payer: Medicare Other | Source: Ambulatory Visit | Attending: Radiation Oncology | Admitting: Radiation Oncology

## 2013-11-05 ENCOUNTER — Ambulatory Visit
Admission: RE | Admit: 2013-11-05 | Discharge: 2013-11-05 | Disposition: A | Discharge status: Home / Self Care | Payer: Medicare Other | Source: Ambulatory Visit | Attending: Radiation Oncology | Admitting: Radiation Oncology

## 2013-11-06 ENCOUNTER — Ambulatory Visit
Admission: RE | Admit: 2013-11-06 | Discharge: 2013-11-06 | Disposition: A | Discharge status: Home / Self Care | Payer: Medicare Other | Source: Ambulatory Visit | Attending: Radiation Oncology | Admitting: Radiation Oncology

## 2013-11-07 ENCOUNTER — Ambulatory Visit
Admission: RE | Admit: 2013-11-07 | Discharge: 2013-11-07 | Disposition: A | Discharge status: Home / Self Care | Payer: Medicare Other | Source: Ambulatory Visit | Attending: Radiation Oncology | Admitting: Radiation Oncology

## 2013-11-08 ENCOUNTER — Ambulatory Visit
Admission: RE | Admit: 2013-11-08 | Discharge: 2013-11-08 | Disposition: A | Discharge status: Home / Self Care | Payer: Medicare Other | Source: Ambulatory Visit | Attending: Radiation Oncology | Admitting: Radiation Oncology

## 2013-11-08 VITALS — BP 128/80 | HR 77 | Temp 97.8°F | Ht 68.0 in | Wt 154.1 lb

## 2013-11-08 DIAGNOSIS — C320 Malignant neoplasm of glottis: Secondary | ICD-10-CM

## 2013-11-08 MED ORDER — BIAFINE EX EMUL
Freq: Two times a day (BID) | CUTANEOUS | Status: DC
  Administered 2013-11-08: 10:00:00 via TOPICAL

## 2013-11-08 NOTE — Progress Notes (Signed)
Equan Cogbill has had 10 fractions to his larynx.  He denies pain, sore throat and trouble swallowing.  He denies trouble eating and has gained 4 lbs since 11/01/13.  He denies fatigue and a dry mouth.  His skin is intact on his neck with slight hyperpigmentation.  He is using biafine and requested a refill.  Another tube has been given.

## 2013-11-08 NOTE — Progress Notes (Signed)
   Department of Radiation Oncology  Phone:  [phone] Fax:        [phone]  Weekly Treatment Note    Name: Darren Mcbride Date: 11/08/2013 MRN: [id number] DOB: [birth year]   Current dose: 22.5 Gy  Current fraction: 10   MEDICATIONS: Current Outpatient Prescriptions  Medication Sig Dispense Refill  . fludrocortisone (FLORINEF) 0.1 MG tablet Take 1 tablet (0.1 mg total) by mouth daily.  90 tablet  3  . hydrocortisone (CORTEF) 20 MG tablet Take 1 tablet (20 mg total) by mouth daily.  90 tablet  3  . levothyroxine (SYNTHROID, LEVOTHROID) 88 MCG tablet Take 1 tablet (88 mcg total) by mouth daily.  90 tablet  3  . Multiple Vitamin (MULTIVITAMIN) capsule Take 1 capsule by mouth daily.         Current Facility-Administered Medications  Medication Dose Route Frequency Provider Last Rate Last Dose  . topical emolient (BIAFINE) emulsion   Topical BID Marye Round, MD         ALLERGIES: Review of patient's allergies indicates no known allergies.   LABORATORY DATA:  Lab Results  Component Value Date   WBC 6.1 02/23/2011   HGB 12.4* 02/23/2011   HCT 41.1 02/23/2011   MCV 78.3 02/23/2011   PLT 117* 02/23/2011   Lab Results  Component Value Date   NA 141 05/24/2013   K 4.1 05/24/2013   CL 104 05/24/2013   CO2 35* 05/24/2013   Lab Results  Component Value Date   ALT 19 03/14/2013   AST 22 03/14/2013   ALKPHOS 61 03/14/2013   BILITOT 0.3 03/14/2013     NARRATIVE: Darren Mcbride was seen today for weekly treatment management. The chart was checked and the patient's films were reviewed. The patient is doing well. He has not complained of any difficulties in terms of sore throat or difficulty swallowing. The patient has gained 4 pounds.  PHYSICAL EXAMINATION: height is 5\' 8"  (1.727 m) and weight is 154 lb 1.6 oz (69.899 kg). His temperature is 97.8 F (36.6 C). His blood pressure is 128/80 and his pulse is 77. His oxygen saturation is 97%.      patient's skin looks very good  at this point. No schedule change.  ASSESSMENT: The patient is doing satisfactorily with treatment.  PLAN: We will continue with the patient's radiation treatment as planned.

## 2013-11-11 ENCOUNTER — Ambulatory Visit
Admission: RE | Admit: 2013-11-11 | Discharge: 2013-11-11 | Disposition: A | Discharge status: Home / Self Care | Payer: Medicare Other | Source: Ambulatory Visit | Attending: Radiation Oncology | Admitting: Radiation Oncology

## 2013-11-12 ENCOUNTER — Ambulatory Visit
Admission: RE | Admit: 2013-11-12 | Discharge: 2013-11-12 | Disposition: A | Discharge status: Home / Self Care | Payer: Medicare Other | Source: Ambulatory Visit | Attending: Radiation Oncology | Admitting: Radiation Oncology

## 2013-11-13 ENCOUNTER — Ambulatory Visit: Admission: RE | Admit: 2013-11-13 | Payer: Medicare Other | Source: Ambulatory Visit | Admitting: Radiation Oncology

## 2013-11-13 ENCOUNTER — Ambulatory Visit
Admission: RE | Admit: 2013-11-13 | Discharge: 2013-11-13 | Disposition: A | Discharge status: Home / Self Care | Payer: Medicare Other | Source: Ambulatory Visit | Attending: Radiation Oncology | Admitting: Radiation Oncology

## 2013-11-14 ENCOUNTER — Ambulatory Visit
Admission: RE | Admit: 2013-11-14 | Discharge: 2013-11-14 | Disposition: A | Discharge status: Home / Self Care | Payer: Medicare Other | Source: Ambulatory Visit | Attending: Radiation Oncology | Admitting: Radiation Oncology

## 2013-11-14 ENCOUNTER — Encounter: Payer: Self-pay | Admitting: Radiation Oncology

## 2013-11-14 VITALS — BP 125/83 | HR 91 | Temp 98.1°F | Resp 20 | Wt 149.9 lb

## 2013-11-14 DIAGNOSIS — C320 Malignant neoplasm of glottis: Secondary | ICD-10-CM

## 2013-11-14 NOTE — Progress Notes (Signed)
  Radiation Oncology         [phone] ________________________________  Name: Darren Mcbride MRN: [id number]  Date: 11/14/2013  DOB: 10-05-1960  Weekly Radiation Therapy Management  Current Dose: 31.5 Gy     Planned Dose:  63 Gy  Narrative . . . . . . . . The patient presents for routine under treatment assessment.                                   The patient is without complaint.  Accompanied by her brother and mother today                                 Set-up films were reviewed.                                 The chart was checked. Physical Findings. . .  weight is 149 lb 14.4 oz (67.994 kg). His oral temperature is 98.1 F (36.7 C). His blood pressure is 125/83 and his pulse is 91. His respiration is 20 and oxygen saturation is 96%. . Weight essentially stable.  The anterior neck area shows some hyperpigmentation changes without skin breakdown. The oral cavity is moist without secondary infection. Impression . . . . . . . The patient is tolerating radiation. Plan . . . . . . . . . . . . Continue treatment as planned.  ________________________________   Blair Promise, PhD, MD

## 2013-11-14 NOTE — Progress Notes (Signed)
Weekly rad txs larynx 14/28 completd, looks like thrush on tongue in the back, no difficulty swallowing, or drinking stated, no pain, no nausea, no taste changes, loss 4 lbs, took ortho vitals, sitting b/p=139/95,P=83 Standing b/p=125/83.,P=91 RR=20 96% room air sats, no c/o sob, mother states he eats all the time, but drinks very little water,

## 2013-11-15 ENCOUNTER — Ambulatory Visit
Admission: RE | Admit: 2013-11-15 | Discharge: 2013-11-15 | Disposition: A | Discharge status: Home / Self Care | Payer: Medicare Other | Source: Ambulatory Visit | Attending: Radiation Oncology | Admitting: Radiation Oncology

## 2013-11-18 ENCOUNTER — Ambulatory Visit
Admission: RE | Admit: 2013-11-18 | Discharge: 2013-11-18 | Disposition: A | Discharge status: Home / Self Care | Payer: Medicare Other | Source: Ambulatory Visit | Attending: Radiation Oncology | Admitting: Radiation Oncology

## 2013-11-19 ENCOUNTER — Ambulatory Visit
Admission: RE | Admit: 2013-11-19 | Discharge: 2013-11-19 | Disposition: A | Discharge status: Home / Self Care | Payer: Medicare Other | Source: Ambulatory Visit | Attending: Radiation Oncology | Admitting: Radiation Oncology

## 2013-11-20 ENCOUNTER — Ambulatory Visit
Admission: RE | Admit: 2013-11-20 | Discharge: 2013-11-20 | Disposition: A | Discharge status: Home / Self Care | Payer: Medicare Other | Source: Ambulatory Visit | Attending: Radiation Oncology | Admitting: Radiation Oncology

## 2013-11-21 ENCOUNTER — Ambulatory Visit
Admission: RE | Admit: 2013-11-21 | Discharge: 2013-11-21 | Disposition: A | Discharge status: Home / Self Care | Payer: Medicare Other | Source: Ambulatory Visit | Attending: Radiation Oncology | Admitting: Radiation Oncology

## 2013-11-22 ENCOUNTER — Encounter: Payer: Self-pay | Admitting: Radiation Oncology

## 2013-11-22 ENCOUNTER — Ambulatory Visit
Admission: RE | Admit: 2013-11-22 | Discharge: 2013-11-22 | Disposition: A | Discharge status: Home / Self Care | Payer: Medicare Other | Source: Ambulatory Visit | Attending: Radiation Oncology | Admitting: Radiation Oncology

## 2013-11-22 VITALS — BP 118/76 | HR 83 | Temp 98.6°F | Resp 20 | Wt 148.7 lb

## 2013-11-22 DIAGNOSIS — C320 Malignant neoplasm of glottis: Secondary | ICD-10-CM

## 2013-11-22 NOTE — Progress Notes (Signed)
   Department of Radiation Oncology  Phone:  [phone] Fax:        [phone]  Weekly Treatment Note    Name: Darren Mcbride Date: 11/22/2013 MRN: [id number] DOB: 06-06-1961   Current dose: 45 Gy  Current fraction: 20   MEDICATIONS: Current Outpatient Prescriptions  Medication Sig Dispense Refill  . emollient (BIAFINE) cream Apply 1 application topically 2 (two) times daily.      . fludrocortisone (FLORINEF) 0.1 MG tablet Take 1 tablet (0.1 mg total) by mouth daily.  90 tablet  3  . hydrocortisone (CORTEF) 20 MG tablet Take 1 tablet (20 mg total) by mouth daily.  90 tablet  3  . levothyroxine (SYNTHROID, LEVOTHROID) 88 MCG tablet Take 1 tablet (88 mcg total) by mouth daily.  90 tablet  3  . Multiple Vitamin (MULTIVITAMIN) capsule Take 1 capsule by mouth daily.         No current facility-administered medications for this encounter.     ALLERGIES: Review of patient's allergies indicates no known allergies.   LABORATORY DATA:  Lab Results  Component Value Date   WBC 6.1 02/23/2011   HGB 12.4* 02/23/2011   HCT 41.1 02/23/2011   MCV 78.3 02/23/2011   PLT 117* 02/23/2011   Lab Results  Component Value Date   NA 141 05/24/2013   K 4.1 05/24/2013   CL 104 05/24/2013   CO2 35* 05/24/2013   Lab Results  Component Value Date   ALT 19 03/14/2013   AST 22 03/14/2013   ALKPHOS 61 03/14/2013   BILITOT 0.3 03/14/2013     NARRATIVE: Darren Mcbride was seen today for weekly treatment management. The chart was checked and the patient's films were reviewed. The patient is doing well. He is using skin cream daily. No difficulty with swallowing according to the family. No pain. Appetite.  PHYSICAL EXAMINATION: weight is 148 lb 11.2 oz (67.45 kg). His oral temperature is 98.6 F (37 C). His blood pressure is 118/76 and his pulse is 83. His respiration is 20.      hyperpigmentation present in the treatment area. No desquamation.  ASSESSMENT: The patient is doing satisfactorily with  treatment.  PLAN: We will continue with the patient's radiation treatment as planned.

## 2013-11-22 NOTE — Progress Notes (Signed)
Weekly rad tx larynx, dark discoloration on neck, skin inatct, uses biafine bid, no difficukty swallowing, no nausea, or pain, appetite good 9:20 AM

## 2013-11-25 ENCOUNTER — Ambulatory Visit
Admission: RE | Admit: 2013-11-25 | Discharge: 2013-11-25 | Disposition: A | Discharge status: Home / Self Care | Payer: Medicare Other | Source: Ambulatory Visit | Attending: Radiation Oncology | Admitting: Radiation Oncology

## 2013-11-26 ENCOUNTER — Ambulatory Visit
Admission: RE | Admit: 2013-11-26 | Discharge: 2013-11-26 | Disposition: A | Discharge status: Home / Self Care | Payer: Medicare Other | Source: Ambulatory Visit | Attending: Radiation Oncology | Admitting: Radiation Oncology

## 2013-11-27 ENCOUNTER — Ambulatory Visit
Admission: RE | Admit: 2013-11-27 | Discharge: 2013-11-27 | Disposition: A | Discharge status: Home / Self Care | Payer: Medicare Other | Source: Ambulatory Visit | Attending: Radiation Oncology | Admitting: Radiation Oncology

## 2013-11-28 ENCOUNTER — Ambulatory Visit
Admission: RE | Admit: 2013-11-28 | Discharge: 2013-11-28 | Disposition: A | Discharge status: Home / Self Care | Payer: Medicare Other | Source: Ambulatory Visit | Attending: Radiation Oncology | Admitting: Radiation Oncology

## 2013-11-29 ENCOUNTER — Ambulatory Visit
Admission: RE | Admit: 2013-11-29 | Discharge: 2013-11-29 | Disposition: A | Discharge status: Home / Self Care | Payer: Medicare Other | Source: Ambulatory Visit | Attending: Radiation Oncology | Admitting: Radiation Oncology

## 2013-11-29 ENCOUNTER — Encounter: Payer: Self-pay | Admitting: Radiation Oncology

## 2013-11-29 VITALS — BP 128/85 | HR 77 | Temp 98.1°F | Resp 20 | Wt 151.6 lb

## 2013-11-29 DIAGNOSIS — C320 Malignant neoplasm of glottis: Secondary | ICD-10-CM

## 2013-11-29 NOTE — Progress Notes (Signed)
Weekly rad tx larynx 25 completed, dry desquamation under neck, uses biafine bid, no c/o pain, nausea, or difficulty  Swallowing food or fluids, only drinks kool aid bid, back of tongue looks like thrush, no fatigue 10:20 AM

## 2013-11-29 NOTE — Progress Notes (Signed)
   Department of Radiation Oncology  Phone:  [phone] Fax:        [phone]  Weekly Treatment Note    Name: Darren Mcbride Date: 11/29/2013 MRN: [id number] DOB: 02-26-1961   Current dose: 56.25 Gy  Current fraction: 25   MEDICATIONS: Current Outpatient Prescriptions  Medication Sig Dispense Refill  . emollient (BIAFINE) cream Apply 1 application topically 2 (two) times daily.      . fludrocortisone (FLORINEF) 0.1 MG tablet Take 1 tablet (0.1 mg total) by mouth daily.  90 tablet  3  . hydrocortisone (CORTEF) 20 MG tablet Take 1 tablet (20 mg total) by mouth daily.  90 tablet  3  . levothyroxine (SYNTHROID, LEVOTHROID) 88 MCG tablet Take 1 tablet (88 mcg total) by mouth daily.  90 tablet  3  . Multiple Vitamin (MULTIVITAMIN) capsule Take 1 capsule by mouth daily.         No current facility-administered medications for this encounter.     ALLERGIES: Review of patient's allergies indicates no known allergies.   LABORATORY DATA:  Lab Results  Component Value Date   WBC 6.1 02/23/2011   HGB 12.4* 02/23/2011   HCT 41.1 02/23/2011   MCV 78.3 02/23/2011   PLT 117* 02/23/2011   Lab Results  Component Value Date   NA 141 05/24/2013   K 4.1 05/24/2013   CL 104 05/24/2013   CO2 35* 05/24/2013   Lab Results  Component Value Date   ALT 19 03/14/2013   AST 22 03/14/2013   ALKPHOS 61 03/14/2013   BILITOT 0.3 03/14/2013     NARRATIVE: Darren Mcbride was seen today for weekly treatment management. The chart was checked and the patient's films were reviewed. The patient is doing well. He continues to be without complaints. No difficulty swallowing foods.  PHYSICAL EXAMINATION: weight is 151 lb 9.6 oz (68.765 kg). His oral temperature is 98.1 F (36.7 C). His blood pressure is 128/85 and his pulse is 77. His respiration is 20.      patient's skin shows significant hyperpigmentation in the treatment area with dry desquamation. I do not appreciate any thrush within the oral  cavity/oropharynx.  ASSESSMENT: The patient is doing satisfactorily with treatment.  PLAN: We will continue with the patient's radiation treatment as planned.

## 2013-12-02 ENCOUNTER — Ambulatory Visit: Payer: Medicare Other | Admitting: Podiatry

## 2013-12-02 ENCOUNTER — Ambulatory Visit
Admission: RE | Admit: 2013-12-02 | Discharge: 2013-12-02 | Disposition: A | Discharge status: Home / Self Care | Payer: Medicare Other | Source: Ambulatory Visit | Attending: Radiation Oncology | Admitting: Radiation Oncology

## 2013-12-03 ENCOUNTER — Ambulatory Visit
Admission: RE | Admit: 2013-12-03 | Discharge: 2013-12-03 | Disposition: A | Discharge status: Home / Self Care | Payer: Medicare Other | Source: Ambulatory Visit | Attending: Radiation Oncology | Admitting: Radiation Oncology

## 2013-12-04 ENCOUNTER — Ambulatory Visit
Admission: RE | Admit: 2013-12-04 | Discharge: 2013-12-04 | Disposition: A | Discharge status: Home / Self Care | Payer: Medicare Other | Source: Ambulatory Visit | Attending: Radiation Oncology | Admitting: Radiation Oncology

## 2013-12-04 ENCOUNTER — Encounter: Payer: Self-pay | Admitting: Radiation Oncology

## 2013-12-04 VITALS — BP 126/85 | HR 97 | Resp 16 | Wt 151.4 lb

## 2013-12-04 DIAGNOSIS — C320 Malignant neoplasm of glottis: Secondary | ICD-10-CM

## 2013-12-04 MED ORDER — BIAFINE EX EMUL
Freq: Once | CUTANEOUS | Status: AC
  Administered 2013-12-04: 09:00:00 via TOPICAL

## 2013-12-04 NOTE — Progress Notes (Signed)
   Department of Radiation Oncology  Phone:  [phone] Fax:        [phone]  Weekly Treatment Note    Name: Darren Mcbride Date: 12/04/2013 MRN: [id number] DOB: February 11, 1961   Current dose: 63 Gy  Current fraction: 28   MEDICATIONS: Current Outpatient Prescriptions  Medication Sig Dispense Refill  . emollient (BIAFINE) cream Apply 1 application topically 2 (two) times daily.      . fludrocortisone (FLORINEF) 0.1 MG tablet Take 1 tablet (0.1 mg total) by mouth daily.  90 tablet  3  . hydrocortisone (CORTEF) 20 MG tablet Take 1 tablet (20 mg total) by mouth daily.  90 tablet  3  . levothyroxine (SYNTHROID, LEVOTHROID) 88 MCG tablet Take 1 tablet (88 mcg total) by mouth daily.  90 tablet  3  . Multiple Vitamin (MULTIVITAMIN) capsule Take 1 capsule by mouth daily.         No current facility-administered medications for this encounter.     ALLERGIES: Review of patient's allergies indicates no known allergies.   LABORATORY DATA:  Lab Results  Component Value Date   WBC 6.1 02/23/2011   HGB 12.4* 02/23/2011   HCT 41.1 02/23/2011   MCV 78.3 02/23/2011   PLT 117* 02/23/2011   Lab Results  Component Value Date   NA 141 05/24/2013   K 4.1 05/24/2013   CL 104 05/24/2013   CO2 35* 05/24/2013   Lab Results  Component Value Date   ALT 19 03/14/2013   AST 22 03/14/2013   ALKPHOS 61 03/14/2013   BILITOT 0.3 03/14/2013     NARRATIVE: Darren Mcbride was seen today for weekly treatment management. The chart was checked and the patient's films were reviewed. The patient is now treatment today. He did well according to his family over the last week. No new complaints. His appetite remains excellent.  PHYSICAL EXAMINATION: weight is 151 lb 6.4 oz (68.675 kg). His blood pressure is 126/85 and his pulse is 97. His respiration is 16.      dry desquamation in the treatment area with significant hyperpigmentation.  ASSESSMENT: The patient did very well with treatment. He did not have  any substantial delay and he was able to maintain his weight well.  PLAN: Followup in one month.

## 2013-12-04 NOTE — Progress Notes (Signed)
Patient has completed radiation treatment today. Hyperpigmentation with dry desquamation of anterior neck noted. Reports using biafine cream as directed. Provided patient with additional tube of Biafine and instructed to continue use bid for next two weeks. Patient verbalized understanding. Patient denies cough or SOB. Denies pain with swallowing. Denies pain at all. Has no complaints. Mother concerned patient's appetite has increased greatly. Weight stable. Patient has a one month follow up appointment already.

## 2013-12-18 NOTE — Progress Notes (Signed)
  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 This was accomplished using customized fields from both the left and the right and a complex isodose plan was developed for the patient's treatment.  Narrative: The patient tolerated radiation treatment relatively well.   He did excellent without significant difficulties in terms of sore throat/poor appetite.  Plan: The patient has completed radiation treatment. The patient will return to radiation oncology clinic for routine followup in one month. I advised the patient to call or return sooner if they have any questions or concerns related to their recovery or treatment. ________________________________  Jodelle Gross, M.D., Ph.D.

## 2014-01-02 ENCOUNTER — Encounter: Payer: Self-pay | Admitting: Radiation Oncology

## 2014-01-03 ENCOUNTER — Other Ambulatory Visit: Payer: Self-pay | Admitting: *Deleted

## 2014-01-03 DIAGNOSIS — E2740 Unspecified adrenocortical insufficiency: Secondary | ICD-10-CM

## 2014-01-03 MED ORDER — HYDROCORTISONE 20 MG PO TABS: 20.0000 mg | ORAL_TABLET | Freq: Every day | ORAL | Status: DC

## 2014-01-03 MED ORDER — FLUDROCORTISONE ACETATE 0.1 MG PO TABS: 0.1000 mg | ORAL_TABLET | Freq: Every day | ORAL | Status: DC

## 2014-01-08 ENCOUNTER — Ambulatory Visit
Admission: RE | Admit: 2014-01-08 | Discharge: 2014-01-08 | Disposition: A | Discharge status: Home / Self Care | Payer: Medicare Other | Source: Ambulatory Visit | Attending: Radiation Oncology | Admitting: Radiation Oncology

## 2014-01-08 ENCOUNTER — Encounter: Payer: Self-pay | Admitting: Radiation Oncology

## 2014-01-08 VITALS — BP 136/82 | HR 93 | Temp 98.2°F | Resp 20 | Ht 68.0 in | Wt 149.1 lb

## 2014-01-08 DIAGNOSIS — C320 Malignant neoplasm of glottis: Secondary | ICD-10-CM

## 2014-01-08 HISTORY — DX: Personal history of irradiation: Z92.3

## 2014-01-08 NOTE — Progress Notes (Signed)
Follow up s/p rad tx left true vocal cord, 10/28/13-12/04/13;  well healed on neck some discoloration still, no c/o pain, or discomfort,  eating most anything. Dry mouth at times, appetite good, no difficulty swallowing drinking enough fluids 10:24 AM

## 2014-01-08 NOTE — Progress Notes (Signed)
  Radiation Oncology         [phone] ________________________________  Name: Darren Mcbride MRN: [id number]  Date: 01/08/2014  DOB: 1961/07/29  Follow-Up Visit Note  CC: Karren Cobble, MD  Melida Quitter, MD  Diagnosis:   Laryngeal cancer  Interval Since Last Radiation:  One month   Narrative:  The patient returns today for routine follow-up.  The patient is doing very well. Continues to eat very well without difficulty according to his mother. No difficulty with drinking liquid. The skin has healed well. No complaints today.                              ALLERGIES:  has No Known Allergies.  Meds: Current Outpatient Prescriptions  Medication Sig Dispense Refill  . emollient (BIAFINE) cream Apply 1 application topically 2 (two) times daily.      . fludrocortisone (FLORINEF) 0.1 MG tablet Take 1 tablet (0.1 mg total) by mouth daily.  90 tablet  3  . hydrocortisone (CORTEF) 20 MG tablet Take 1 tablet (20 mg total) by mouth daily.  90 tablet  3  . levothyroxine (SYNTHROID, LEVOTHROID) 88 MCG tablet Take 1 tablet (88 mcg total) by mouth daily.  90 tablet  3  . Multiple Vitamin (MULTIVITAMIN) capsule Take 1 capsule by mouth daily.         No current facility-administered medications for this encounter.    Physical Findings: The patient is in no acute distress. Patient is alert and oriented.  height is 5\' 8"  (1.727 m) and weight is 149 lb 1.6 oz (67.631 kg). His oral temperature is 98.2 F (36.8 C). His blood pressure is 136/82 and his pulse is 93. His respiration is 20 and oxygen saturation is 96%. Marland Kitchen   Hyperpigmentation in the neck at the site of treatment, no desquamation currently Oral cavity clear  Lab Findings: Lab Results  Component Value Date   WBC 6.1 02/23/2011   HGB 12.4* 02/23/2011   HCT 41.1 02/23/2011   MCV 78.3 02/23/2011   PLT 117* 02/23/2011     Radiographic Findings: No results found.  Impression:    The patient is doing well 1 month after completion of  radiation treatment. No ongoing difficulties.  Plan:  Followup in 2 months.   Jodelle Gross, M.D., Ph.D.

## 2014-02-13 ENCOUNTER — Other Ambulatory Visit: Payer: Self-pay | Admitting: *Deleted

## 2014-02-13 DIAGNOSIS — E039 Hypothyroidism, unspecified: Secondary | ICD-10-CM

## 2014-02-13 MED ORDER — LEVOTHYROXINE SODIUM 88 MCG PO TABS: 88.0000 ug | ORAL_TABLET | Freq: Every day | ORAL | Status: DC

## 2014-03-07 ENCOUNTER — Encounter: Payer: Self-pay | Admitting: Internal Medicine

## 2014-03-07 ENCOUNTER — Ambulatory Visit (INDEPENDENT_AMBULATORY_CARE_PROVIDER_SITE_OTHER): Payer: Medicare Other | Admitting: Internal Medicine

## 2014-03-07 VITALS — BP 132/88 | HR 72 | Temp 97.8°F | Ht 68.0 in | Wt 147.8 lb

## 2014-03-07 DIAGNOSIS — E2749 Other adrenocortical insufficiency: Secondary | ICD-10-CM | POA: Diagnosis not present

## 2014-03-07 DIAGNOSIS — Z23 Encounter for immunization: Secondary | ICD-10-CM | POA: Diagnosis not present

## 2014-03-07 DIAGNOSIS — F840 Autistic disorder: Secondary | ICD-10-CM

## 2014-03-07 DIAGNOSIS — C320 Malignant neoplasm of glottis: Secondary | ICD-10-CM

## 2014-03-07 DIAGNOSIS — E039 Hypothyroidism, unspecified: Secondary | ICD-10-CM

## 2014-03-07 DIAGNOSIS — E2740 Unspecified adrenocortical insufficiency: Secondary | ICD-10-CM

## 2014-03-07 DIAGNOSIS — I83893 Varicose veins of bilateral lower extremities with other complications: Secondary | ICD-10-CM

## 2014-03-07 LAB — COMPLETE METABOLIC PANEL WITH GFR
ALT: 18 U/L (ref 0–53)
AST: 19 U/L (ref 0–37)
Albumin: 4.2 g/dL (ref 3.5–5.2)
Alkaline Phosphatase: 63 U/L (ref 39–117)
BUN: 15 mg/dL (ref 6–23)
CALCIUM: 9.4 mg/dL (ref 8.4–10.5)
CHLORIDE: 103 meq/L (ref 96–112)
CO2: 32 meq/L (ref 19–32)
Creat: 1.01 mg/dL (ref 0.50–1.35)
GFR, Est African American: >89 mL/min
GFR, Est Non African American: 84 mL/min
Glucose, Bld: 67 mg/dL — ABNORMAL LOW (ref 70–99)
Potassium: 3.9 mEq/L (ref 3.5–5.3)
Sodium: 142 mEq/L (ref 135–145)
Total Bilirubin: 0.5 mg/dL (ref 0.2–1.2)
Total Protein: 7.1 g/dL (ref 6.0–8.3)

## 2014-03-07 LAB — CBC WITH DIFFERENTIAL/PLATELET
Basophils Absolute: 0 10*3/uL (ref 0.0–0.1)
Basophils Relative: 1 % (ref 0–1)
EOS ABS: 0.1 10*3/uL (ref 0.0–0.7)
EOS PCT: 2 % (ref 0–5)
HCT: 35.4 % — ABNORMAL LOW (ref 39.0–52.0)
HEMOGLOBIN: 10.9 g/dL — AB (ref 13.0–17.0)
LYMPHS ABS: 1.1 10*3/uL (ref 0.7–4.0)
Lymphocytes Relative: 26 % (ref 12–46)
MCH: 23.9 pg — AB (ref 26.0–34.0)
MCHC: 30.8 g/dL (ref 30.0–36.0)
MCV: 77.6 fL — AB (ref 78.0–100.0)
Monocytes Absolute: 0.3 10*3/uL (ref 0.1–1.0)
Monocytes Relative: 8 % (ref 3–12)
Neutro Abs: 2.7 10*3/uL (ref 1.7–7.7)
Neutrophils Relative %: 63 % (ref 43–77)
PLATELETS: 120 10*3/uL — AB (ref 150–400)
RBC: 4.56 MIL/uL (ref 4.22–5.81)
RDW: 14.9 % (ref 11.5–15.5)
WBC: 4.3 10*3/uL (ref 4.0–10.5)

## 2014-03-07 NOTE — Assessment & Plan Note (Signed)
Plans: Check TSH levels.

## 2014-03-07 NOTE — Patient Instructions (Addendum)
Take all the medications as recommended below.

## 2014-03-07 NOTE — Assessment & Plan Note (Addendum)
Bilateral varicose veins extending from foot to the midthigh, in both lower extremities, involving the great saphenous vein. Patient presenting with chronic non-pitting edema consistent with lymphedema.  Plans: Class II compression stockings, with moderate level of support, with compression pressures of 20-30 mmHg. Recommended daily walking, ankle flexion/extension exercises, leg elevation.

## 2014-03-07 NOTE — Assessment & Plan Note (Signed)
Unclear for the exact etiology of Adrenal Insufficiency and Hypothyroidism. 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 Patient seems to be stable on chronic management with hydrocortisone, fludrocortisone, synthroid.  Plans: Check CBC, CMP, TSH. Continue current management.

## 2014-03-07 NOTE — Progress Notes (Signed)
Attending physician note: Presenting problems, physical findings, medications, reviewed with resident physician Dr. Theophilus Bones and I concur with his assessment and management plan. Murriel Hopper M.D. FACP

## 2014-03-07 NOTE — Assessment & Plan Note (Signed)
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  Plans: As per Radiation Oncology.

## 2014-03-07 NOTE — Assessment & Plan Note (Signed)
Patients mother reports that he is responding slightly slowly to her questions, since he was diagnosed with squamous cell carcinoma of the Vocal cords.  Unable to get the answers for all the screening questions for depression as he could not understand/comprehend the questions, but all the questions he answered were negative for depression. Patient was seen by psychiatrist 3 months ago, as per mother, they said "nothing is wrong with him". Requesting a letter for school if its okay to resume school.  Plans: Gave a letter stating that it is okay to resume school.

## 2014-03-07 NOTE — Progress Notes (Signed)
Subjective:   Patient ID: Darren Mcbride male   DOB: 1961/05/13 53 y.o.   MRN: [id number]  HPI: Darren Mcbride is a 53 y.o. 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  Patient is here in the office along with his mother who is his primary care taker. History is obtained from the patient and his mom.  Patient finished radiation therapy for his the squamous cell carcinoma of the vocal cords and is following with radiation oncology every 3 months.   Patients mother reports that he has varicose veins in both lower extremities for a long time and that the legs are swollen over the last few months from ankle to knees. She denies any ulcers in the lower extremities. She reports that since he got diagnosed with cancer, he has become "slow" in responding to her questions. Patient denies any lack of interest in doing things, difficulty falling/staying asleep, feeling tired/lack of energy, lack of appetite, feeling depressed.  Patient denies any other complaints.  Past Medical History  Diagnosis Date  . Autism spectrum disorder 09/22/2011  . 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 Always had stable electrolytes.    . Hypothyroidism 06/30/2006  . Allergic rhinitis 02/06/2013  . Gastroesophageal reflux disease 05/24/2013  . 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 Varicose veins of lower extremities  02/06/2013  . Bilateral cataracts     s/p bilateral extraction  . Onychomycosis of toenail 08/30/2013  . 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 History of radiation therapy 10/28/13-12/04/13    vocal cord,63Gy/82fx   Current Outpatient  Prescriptions  Medication Sig Dispense Refill  . emollient (BIAFINE) cream Apply 1 application topically 2 (two) times daily.      . fludrocortisone (FLORINEF) 0.1 MG tablet Take 1 tablet (0.1 mg total) by mouth daily.  90 tablet  3  . hydrocortisone (CORTEF) 20 MG tablet Take 1 tablet (20 mg total) by mouth daily.  90 tablet  3  . levothyroxine (SYNTHROID, LEVOTHROID) 88 MCG tablet Take 1 tablet (88 mcg total) by mouth daily.  90 tablet  3  . Multiple Vitamin (MULTIVITAMIN) capsule Take 1 capsule by mouth daily.         No current facility-administered medications for this visit.   Family History  Problem Relation Age of Onset  . Diabetes Mother   . Hypertension Mother   . Cataracts Mother   . Breast cancer Mother 19    1985, surgery, chemo and XRT  . Prostate cancer Paternal Uncle   . Schizophrenia Sister   . Diabetes insipidus Sister   . Alcoholism Father   . Alcoholism Brother   . Alcoholism Brother   . Alcoholism Brother   . Congestive Heart Failure Brother   . Colon cancer Neg Hx   . Esophageal cancer Neg Hx   . Rectal cancer Neg Hx   . Stomach cancer Neg Hx    History   Social History  . Marital Status: Single    Spouse Name: N/A    Number of Children: 0  . Years of Education: 9th   Occupational History  . student    Social History Main Topics  .  Smoking status: Never Smoker   . Smokeless tobacco: Never Used  . Alcohol Use: No  . Drug Use: No  . Sexual Activity: No   Other Topics Concern  . None   Social History Narrative   Patient lives with his mother at home.   Patient is left-handed.   Patient drinks 2 cups of soda daily at home.   Patient is attending [employer].         Review of Systems: Pertinent items are noted in HPI. Objective:  Physical Exam: Filed Vitals:   03/07/14 1005  BP: 132/88  Pulse: 72  Temp: 97.8 F (36.6 C)  TempSrc: Oral  Height: 5\' 8"  (1.727 m)  Weight: 147 lb 12.8 oz (67.042 kg)  SpO2: 99%   Constitutional: Vital  signs reviewed.  Patient is an age appropriate looking, autistic gentleman,  well-developed and well-nourished, pleasant, and is in no acute distress and cooperative with exam.  Mouth: Moist mucous membranes. Eyes: EOMI, Conjunctivae normal, No scleral icterus.  Neck: Several small areas of de-pigmentation noted over the anterior aspect of the neck  Cardiovascular: RRR, S1 normal, S2 normal, no MRG Pulmonary/Chest: normal respiratory effort, CTAB, no wheezes, rales, or rhonchi Abdominal: Soft. Non-tender, non-distended, bowel sounds are normal, no masses, organomegaly, or guarding present. Extremities: Bilateral varicose veins extending from foot to mid thigh. No ulcers noted. No pitting edema. Chronic non pitting edema noted. Neurological: Alert and oriented to person, place and time. Strength is normal and symmetric bilaterally Skin: Warm, dry and intact.  Psychiatric: Normal mood and affect   Assessment & Plan:

## 2014-03-08 LAB — TSH: TSH: 2.302 u[IU]/mL (ref 0.350–4.500)

## 2014-03-10 ENCOUNTER — Ambulatory Visit
Admission: RE | Admit: 2014-03-10 | Discharge: 2014-03-10 | Disposition: A | Discharge status: Home / Self Care | Payer: Medicare Other | Source: Ambulatory Visit | Attending: Radiation Oncology | Admitting: Radiation Oncology

## 2014-03-10 VITALS — BP 144/88 | HR 76 | Temp 98.1°F | Resp 20 | Ht 68.0 in | Wt 149.5 lb

## 2014-03-10 DIAGNOSIS — Z79899 Other long term (current) drug therapy: Secondary | ICD-10-CM | POA: Diagnosis not present

## 2014-03-10 DIAGNOSIS — C320 Malignant neoplasm of glottis: Secondary | ICD-10-CM | POA: Diagnosis not present

## 2014-03-10 MED ORDER — LARYNGOSCOPY SOLUTION RAD-ONC
15.0000 mL | Freq: Once | TOPICAL | Status: AC
  Administered 2014-03-10: 15 mL via TOPICAL
  Filled 2014-03-10: qty 15

## 2014-03-10 NOTE — Progress Notes (Signed)
Follow up s/p rad tx 10/28/13-12/04/13 left true vocal cord, no difficulty swallowing, had waffles this am, no c/o pain, nausea, room air sats=97%, last TSH done on 03/07/14=2.302 Saw Dr. Angelique Blonder same day, legs swelling, noted 10:52 AM

## 2014-03-11 NOTE — Progress Notes (Signed)
  Radiation Oncology         [phone] ________________________________  Name: Darren Mcbride MRN: [id number]  Date: 03/10/2014  DOB: 1961-07-10  Follow-Up Visit Note  CC: Karren Cobble, MD  Karren Cobble, MD  Diagnosis:   Squamous cell carcinoma of the vocal cord  Interval Since Last Radiation:  The patient completed radiation treatment on 12/04/2013   Narrative:  The patient returns today for routine follow-up.  The patient and his mother state that he is doing well at this time. She states that he has an excellent appetite. No difficulty swallowing. No pain. Overall they feel that he has done excellent.                              ALLERGIES:  has No Known Allergies.  Meds: Current Outpatient Prescriptions  Medication Sig Dispense Refill  . fludrocortisone (FLORINEF) 0.1 MG tablet Take 1 tablet (0.1 mg total) by mouth daily.  90 tablet  3  . hydrocortisone (CORTEF) 20 MG tablet Take 1 tablet (20 mg total) by mouth daily.  90 tablet  3  . levothyroxine (SYNTHROID, LEVOTHROID) 88 MCG tablet Take 1 tablet (88 mcg total) by mouth daily.  90 tablet  3  . Multiple Vitamin (MULTIVITAMIN) capsule Take 1 capsule by mouth daily.        Marland Kitchen emollient (BIAFINE) cream Apply 1 application topically 2 (two) times daily.       No current facility-administered medications for this encounter.    Physical Findings: The patient is in no acute distress. Patient is alert and oriented.  height is 5\' 8"  (1.727 m) and weight is 149 lb 8 oz (67.813 kg). His oral temperature is 98.1 F (36.7 C). His blood pressure is 144/88 and his pulse is 76. His respiration is 20 and oxygen saturation is 97%. .   Neck has healed well with some hypopigmentation anteriorly as well as a broader area of hyperpigmentation. No ongoing desquamation. Oral cavity clear  Fiberoptic exam: After the use of topical anesthetic, the flexible laryngoscope was passed through the right near. Good visualization was obtained. No  lesions or suspicious findings within the larynx, hypopharynx, oropharynx or nasopharynx.   Lab Findings: Lab Results  Component Value Date   WBC 4.3 03/07/2014   HGB 10.9* 03/07/2014   HCT 35.4* 03/07/2014   MCV 77.6* 03/07/2014   PLT 120* 03/07/2014     Radiographic Findings: No results found.  Impression:    The patient is doing well. He is clinically NED at this time and his exam was very good with no suspicious findings  Plan:  The patient will return to clinic in 6 months for ongoing followup.  I spent 15 minutes with the patient today, the majority of which was spent counseling the patient on the diagnosis of cancer and coordinating care.   Jodelle Gross, M.D., Ph.D.

## 2014-03-13 ENCOUNTER — Ambulatory Visit: Payer: Self-pay | Admitting: Radiation Oncology

## 2014-06-13 ENCOUNTER — Encounter: Payer: Self-pay | Admitting: Internal Medicine

## 2014-06-13 ENCOUNTER — Ambulatory Visit (INDEPENDENT_AMBULATORY_CARE_PROVIDER_SITE_OTHER): Payer: Medicare Other | Admitting: Internal Medicine

## 2014-06-13 VITALS — BP 137/74 | HR 82 | Temp 98.1°F | Wt 151.0 lb

## 2014-06-13 DIAGNOSIS — F840 Autistic disorder: Secondary | ICD-10-CM

## 2014-06-13 DIAGNOSIS — E039 Hypothyroidism, unspecified: Secondary | ICD-10-CM

## 2014-06-13 DIAGNOSIS — R1310 Dysphagia, unspecified: Secondary | ICD-10-CM

## 2014-06-13 DIAGNOSIS — H6123 Impacted cerumen, bilateral: Secondary | ICD-10-CM | POA: Insufficient documentation

## 2014-06-13 DIAGNOSIS — I8393 Asymptomatic varicose veins of bilateral lower extremities: Secondary | ICD-10-CM

## 2014-06-13 DIAGNOSIS — C320 Malignant neoplasm of glottis: Secondary | ICD-10-CM

## 2014-06-13 DIAGNOSIS — E2740 Unspecified adrenocortical insufficiency: Secondary | ICD-10-CM

## 2014-06-13 DIAGNOSIS — H918X3 Other specified hearing loss, bilateral: Secondary | ICD-10-CM

## 2014-06-13 DIAGNOSIS — Z23 Encounter for immunization: Secondary | ICD-10-CM

## 2014-06-13 NOTE — Assessment & Plan Note (Signed)
He continues to be concerned about his varicose veins with regards to their appearance. That being said, when asked about any symptoms he denies that they are painful. There are no associated skin ulcers. He's been compliant with his compression stockings and this will be continued as the therapy for his varicosities. He asked for another pair of TED hose and this was provided to him.

## 2014-06-13 NOTE — Assessment & Plan Note (Signed)
Mr. Bialas is able to manage most activities of daily living despite his autism. His mother is not sure he is thinking clearly although states that during a neurocognitive examination he did better than she did in that he is very smart. I do not have the results of this study. We will continue to follow his autism spectrum disorder expectantly.

## 2014-06-13 NOTE — Assessment & Plan Note (Signed)
His mother had some concerns that he may have decreased hearing although she states he does not listen to the TV with the volume very high. Examination of his ears revealed bilateral cerumen impaction. This was irrigated.

## 2014-06-13 NOTE — Patient Instructions (Signed)
It was great to see you again.  I am glad you did so well with the radiation.  1) Keep taking all of your medications as you are.  2) I will sent you to get new stockings fitted.  3) I ordered a test to check your swallowing.  4) We irrigated your ears to get the wax out.  I hope this helps your hearing.  5) We gave you the flu shot today.  I will see you in 6 months, sooner if necessary.

## 2014-06-13 NOTE — Progress Notes (Signed)
   Subjective:    Patient ID: Darren Mcbride, male    DOB: 1961/07/22, 53 y.o.   MRN: [id number]  HPI  Please see the A&P for the status of the pt's chronic medical problems.  Review of Systems  Constitutional: Negative for activity change, appetite change, fatigue and unexpected weight change.  HENT: Positive for hearing loss. Negative for sore throat, trouble swallowing and voice change.   Respiratory: Negative for shortness of breath and wheezing.   Cardiovascular: Negative for chest pain, palpitations and leg swelling.  Gastrointestinal: Negative for nausea, vomiting, abdominal pain, diarrhea, constipation and abdominal distention.  Musculoskeletal: Negative for arthralgias, joint swelling and myalgias.  Skin: Negative for color change, rash and wound.  Neurological: Negative for weakness.      Objective:   Physical Exam  Nursing note and vitals reviewed. Constitutional: He appears well-developed and well-nourished. No distress.  HENT:  Head: Normocephalic and atraumatic.  Eyes: Conjunctivae are normal. Right eye exhibits no discharge. Left eye exhibits no discharge. No scleral icterus.  Musculoskeletal: Normal range of motion. He exhibits no edema and no tenderness.  Neurological: He is alert. He exhibits normal muscle tone.  Skin: Skin is warm and dry. No rash noted. He is not diaphoretic. No erythema.  Psychiatric: He has a normal mood and affect. His behavior is normal.      Assessment & Plan:   Please see problem oriented charting.

## 2014-06-13 NOTE — Assessment & Plan Note (Signed)
His TSH is within normal range within the last 3 months. He also has no signs or symptoms of hypothyroidism. We will therefore continue the Synthroid 88 g by mouth every morning.

## 2014-06-13 NOTE — Assessment & Plan Note (Signed)
He noted that food would stick in his lower chest when eating. He states this did not occur with fluids. We will therefore obtain a barium swallow to look for evidence of a esophageal stricture. If present, he will be referred to gastroenterology for endoscopy for further evaluation of the cause and possible dilatation if benign.

## 2014-06-13 NOTE — Assessment & Plan Note (Signed)
He received a flu vaccination today. He is otherwise up-to-date on his preventative health care.

## 2014-06-13 NOTE — Assessment & Plan Note (Signed)
He continues to use the Florinef 0.1 mg daily and hydrocortisone 20 mg by mouth daily. He is asymptomatic in terms of his adrenal insufficiency. We will therefore continue the Florinef and hydrocortisone at the current doses.

## 2014-06-13 NOTE — Assessment & Plan Note (Signed)
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 From March through April he underwent curative intent radiation therapy receiving a total dose of 63 Gy in 28 fractions. He tolerated this therapy well and his hoarse voice, which was his presenting symptom, has resolved. Subsequent follow-up in radiation oncology has been unremarkable. We will continue to follow symptomatically.

## 2014-06-17 ENCOUNTER — Ambulatory Visit (HOSPITAL_COMMUNITY): Payer: Self-pay

## 2014-06-20 ENCOUNTER — Ambulatory Visit (HOSPITAL_COMMUNITY)
Admission: RE | Admit: 2014-06-20 | Discharge: 2014-06-20 | Disposition: A | Discharge status: Home / Self Care | Payer: Medicare Other | Source: Ambulatory Visit | Attending: Internal Medicine | Admitting: Internal Medicine

## 2014-06-20 DIAGNOSIS — R1310 Dysphagia, unspecified: Secondary | ICD-10-CM | POA: Diagnosis not present

## 2014-08-22 ENCOUNTER — Encounter: Payer: Self-pay | Admitting: Internal Medicine

## 2014-08-22 ENCOUNTER — Ambulatory Visit (INDEPENDENT_AMBULATORY_CARE_PROVIDER_SITE_OTHER): Payer: Medicare Other | Admitting: Internal Medicine

## 2014-08-22 VITALS — BP 145/88 | HR 85 | Temp 97.6°F | Ht 68.0 in | Wt 151.2 lb

## 2014-08-22 DIAGNOSIS — F840 Autistic disorder: Secondary | ICD-10-CM

## 2014-08-22 DIAGNOSIS — C320 Malignant neoplasm of glottis: Secondary | ICD-10-CM

## 2014-08-22 DIAGNOSIS — H9190 Unspecified hearing loss, unspecified ear: Secondary | ICD-10-CM

## 2014-08-22 DIAGNOSIS — E039 Hypothyroidism, unspecified: Secondary | ICD-10-CM

## 2014-08-22 DIAGNOSIS — E038 Other specified hypothyroidism: Secondary | ICD-10-CM

## 2014-08-22 DIAGNOSIS — I8393 Asymptomatic varicose veins of bilateral lower extremities: Secondary | ICD-10-CM

## 2014-08-22 DIAGNOSIS — H6123 Impacted cerumen, bilateral: Secondary | ICD-10-CM

## 2014-08-22 DIAGNOSIS — E2740 Unspecified adrenocortical insufficiency: Secondary | ICD-10-CM

## 2014-08-22 NOTE — Assessment & Plan Note (Signed)
Darren Mcbride mom is very concerned that his memory is worsening and that he is no longer able to get to school by himself. In looking at neuro-cognitive score sheets from 11/2012, and then 8 months later in 08/2013, there is significant drop in his score (26-->21). Patient was advised to have someone accompany him to school if possible, or at the very least, to have someone see him to the bus and from the bus on the way to and from school.   - Repeat neuro-cognitive testing with Dr. Janann Colonel

## 2014-08-22 NOTE — Assessment & Plan Note (Signed)
Patient is currently following with radiation oncology every 6 months, following his 10/2013 to 11/2013 curative intent radiation therapy. He has been very stable at these follow up appointments with no suspicious findings. His hoarse voice is now resolved.   - Continue to follow symptomatically

## 2014-08-22 NOTE — Assessment & Plan Note (Signed)
Patient's mother remains concerned about the patient's decreased hearing. The patient is not bothered by his hearing, and does not believe it is reduced. Mother again reports that he has not increased the volume on the TV that he consistently watches and on exam, patient heard finger rub symmetrically and fully. Though he had cerumen impaction irrigation during 05/2014 clinic visit, mother says that this made no difference in the patient's hearing. T  - Referral to audiology for assessment - Patient did not receive cerumen irrigation today (neither mother nor patient interested in him having this today), but may want to consider during next appointment

## 2014-08-22 NOTE — Assessment & Plan Note (Signed)
TSH has trended down from 1 year ago (3.046-->3.372-->2.302 in 02/2014). The patient is currently taking 88 mcg Synthroid by mouth every morning and reports good compliance with this. Patient has no signs or symptoms of hypothyroidism at this time. Given his reportedly decreased memory and decreased score on neuro-cognitive testing, will continue to trend TSH today.  - Repeat TSH today

## 2014-08-22 NOTE — Progress Notes (Signed)
Subjective:     Patient ID: Darren Mcbride, male   DOB: 05/07/1961, 54 y.o.   MRN: [id number]  HPI Darren Mcbride is a 54 yo man with autism, adrenal insufficiency, hypothyroidism, varicose veins (without ulceration), squamous cell carcinoma of the vocal cords (diagnosed 08/2013, now s/p radiation therapy) who presents to clinic for routine follow up. He is accompanied by his mother, who is his primary caregiver; she provided the majority of the history.   Patient's mother is most concerned about his decreased hearing over the past 2-3 months and increased forgetfulness over the past 6 months. Upon chart review, both concerns were also mentioned at his 05/2014 appointment. In terms of the hearing loss, his mother reports that he does not respond when she calls him from her room (which is several rooms away from his). When asked about this, the patient states that he sometimes hears her. Patient has not increased volume on the TV that he frequently uses. On his last visit, he had significant cerumen impaction and received b/l irrigation. Patient's mother states that this had no effect on his decreased hearing.   With regard to his forgetfulness, his mother states that even when she repeats herself, he will often forget to do a task when she asks. She believes his memory has gotten worse over the past 6 months. 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  The patient finished radiation therapy for his SCC of the vocal cords last year and is following with radiation oncology every 6 months; he complained of dysphagia on his last visit, which was investigated with a barium swallow. This was negative for esophageal stricture, and his symptoms have fully resolved.   Objective:   Review of Systems  Constitutional: Negative for activity change, appetite change, fatigue and unexpected weight change.       No difficulty sleeping  HENT: Positive for  hearing loss. Negative for ear pain, facial swelling, mouth sores, sore throat, trouble swallowing and voice change.   Eyes: Positive for visual disturbance.       Visual acuity L>R (long-term problem)   Respiratory: Negative for shortness of breath and wheezing.   Cardiovascular: Negative for chest pain, palpitations and leg swelling.  Gastrointestinal: Negative for nausea, vomiting, abdominal pain, diarrhea, constipation and abdominal distention.  Musculoskeletal: Negative for myalgias, joint swelling and arthralgias.  Skin: Negative for color change, rash and wound.  Neurological: Negative for weakness and headaches.         Physical Exam  Constitutional: He is oriented to person, place, and time. He appears well-developed and well-nourished. No distress.  HENT:  Head: Normocephalic and atraumatic.  Right Ear: External ear normal.  Left Ear: External ear normal.  Able to hear finger rub equally and fully in both ears  Voice is not hoarse  Eyes: Conjunctivae and EOM are normal. Pupils are equal, round, and reactive to light.  Right eye deviates laterally; suspect strabismus with poor lateral rectus control OD. Patient has some visual acuity in right eye (just worse than in left eye) and EOM are intact.  Neck: Normal range of motion. Neck supple.  Cardiovascular: Normal rate, regular rhythm and normal heart sounds.   No murmur heard. Pulmonary/Chest: Effort normal and breath sounds normal. No respiratory distress. He has no wheezes.  Abdominal: Soft. Bowel sounds are normal. He exhibits no distension. There is no tenderness.  Musculoskeletal: He exhibits no edema.  Ted hoes in  place b/l lower extremities  Neurological: He is alert and oriented to person, place, and time. No cranial nerve deficit. Coordination normal.  Skin: Skin is warm and dry. No rash noted.  Psychiatric: He has a normal mood and affect.  Interactive, followed commands well  Nursing note and vitals reviewed.       Please see problem-oriented charting for assessment and plan.

## 2014-08-22 NOTE — Patient Instructions (Signed)
Darren Mcbride has a referral for audiology (hearing testing) and a referral to neurology for another neuro-cognitive assessment (the memory test).  In the meantime, please be sure that someone sees him get on and off the bus on the way to and from school.  Keep him on the 88 mcg of Synthroid for now and encourage him to eat a balanced diet. We will get a TSH level and a glucose level today.  General Instructions:   Please bring your medicines with you each time you come to clinic.  Medicines may include prescription medications, over-the-counter medications, herbal remedies, eye drops, vitamins, or other pills.   Progress Toward Treatment Goals:  No flowsheet data found.  Self Care Goals & Plans:  No flowsheet data found.  No flowsheet data found.   Care Management & Community Referrals:  Referral 06/13/2014  Referrals made to community resources none

## 2014-08-22 NOTE — Assessment & Plan Note (Signed)
Patient has been taking his Florinef 0.1 mg by mouth daily and his hydrocortisone 20 mg by mouth daily. He has no symptoms of adrenal insufficiency at this time.

## 2014-08-22 NOTE — Assessment & Plan Note (Signed)
[name] has extensive varicose veins. He has been using his TED hose and feels that they are helping his legs. He continues to have no lower extremity pain. There are no associated skin ulcers (and have been none in the past).

## 2014-08-22 NOTE — Progress Notes (Signed)
Internal Medicine Clinic Attending  Case discussed with Dr. Mallory at the time of the visit.  We reviewed the resident's history and exam and pertinent patient test results.  I agree with the assessment, diagnosis, and plan of care documented in the resident's note. 

## 2014-08-23 LAB — TSH: TSH: 1.377 u[IU]/mL (ref 0.350–4.500)

## 2014-09-04 ENCOUNTER — Encounter: Payer: Self-pay | Admitting: *Deleted

## 2014-09-11 ENCOUNTER — Ambulatory Visit: Admission: RE | Admit: 2014-09-11 | Payer: Medicare Other | Source: Ambulatory Visit | Admitting: Radiation Oncology

## 2014-09-12 ENCOUNTER — Ambulatory Visit
Admission: RE | Admit: 2014-09-12 | Discharge: 2014-09-12 | Disposition: A | Discharge status: Home / Self Care | Payer: Medicare Other | Source: Ambulatory Visit | Attending: Radiation Oncology | Admitting: Radiation Oncology

## 2014-09-12 ENCOUNTER — Encounter: Payer: Self-pay | Admitting: Radiation Oncology

## 2014-09-12 ENCOUNTER — Ambulatory Visit: Payer: Medicare Other | Admitting: Radiation Oncology

## 2014-09-12 VITALS — BP 149/89 | HR 67 | Temp 98.1°F | Resp 20 | Ht 68.0 in | Wt 151.4 lb

## 2014-09-12 DIAGNOSIS — C320 Malignant neoplasm of glottis: Secondary | ICD-10-CM | POA: Insufficient documentation

## 2014-09-12 MED ORDER — LARYNGOSCOPY SOLUTION RAD-ONC
15.0000 mL | Freq: Once | TOPICAL | Status: AC
  Administered 2014-09-12: 15 mL via TOPICAL
  Filled 2014-09-12: qty 15

## 2014-09-12 NOTE — Progress Notes (Signed)
Follow up s/p radiation therapy 10/28/13-12/04/13, patient alert, oriented to name, can hear me fine answers appropriately,mother primary care giver, giving most hystory, she makes sure he takes his medications, no c/o pain, or nausea, appetite great, per mother he mostly eats and watches t.v. No exercising, no difficulty swallowing,  11:32 AM

## 2014-09-12 NOTE — Progress Notes (Signed)
  Radiation Oncology         [phone] ________________________________  Name: Darren Mcbride MRN: [id number]  Date: 09/12/2014  DOB: 03/27/1961  Follow-Up Visit Note  CC: Karren Cobble, MD  Karren Cobble, MD  Diagnosis:   Squamous cell carcinoma of the vocal cord  Interval Since Last Radiation:  The patient completed radiation treatment on 12/04/2013   Narrative:  The patient returns today for routine follow-up.  The patient and his mother state that he is doing well at this time. She states that he continues to eat well. No complaints of pain. No hoarseness or voice changes.  ALLERGIES:  has No Known Allergies.  Meds: Current Outpatient Prescriptions  Medication Sig Dispense Refill  . fludrocortisone (FLORINEF) 0.1 MG tablet Take 1 tablet (0.1 mg total) by mouth daily. 90 tablet 3  . hydrocortisone (CORTEF) 20 MG tablet Take 1 tablet (20 mg total) by mouth daily. 90 tablet 3  . levothyroxine (SYNTHROID, LEVOTHROID) 88 MCG tablet Take 1 tablet (88 mcg total) by mouth daily. 90 tablet 3   No current facility-administered medications for this encounter.    Physical Findings: The patient is in no acute distress. Patient is alert and oriented.  height is 5\' 8"  (1.727 m) and weight is 151 lb 6.4 oz (68.675 kg). His oral temperature is 98.1 F (36.7 C). His blood pressure is 149/89 and his pulse is 67. His respiration is 20. Marland Kitchen   Neck has healed well. Radiation changes remain. No lymphadenopathy Oral cavity clear  Fiberoptic exam: After the use of topical anesthetic, the flexible laryngoscope was passed through the right near. Good visualization was obtained of the vocal cord region. No lesions or suspicious findings within the larynx, hypopharynx, oropharynx or nasopharynx.   Lab Findings: Lab Results  Component Value Date   WBC 4.3 03/07/2014   HGB 10.9* 03/07/2014   HCT 35.4* 03/07/2014   MCV 77.6* 03/07/2014   PLT 120* 03/07/2014     Radiographic Findings: No  results found.  Impression:    The patient is doing well. He is clinically NED at this time and his exam was very good with no suspicious findings  Plan:  The patient will return to clinic in 6 months for ongoing followup.  I spent 10 minutes with the patient today, the majority of which was spent counseling the patient on the diagnosis of cancer and coordinating care.   Jodelle Gross, M.D., Ph.D.

## 2014-09-24 ENCOUNTER — Encounter: Payer: Self-pay | Admitting: *Deleted

## 2014-10-03 ENCOUNTER — Ambulatory Visit: Payer: Medicare Other | Admitting: Audiology

## 2014-10-16 ENCOUNTER — Encounter: Payer: Medicare Other | Admitting: Internal Medicine

## 2014-10-16 ENCOUNTER — Ambulatory Visit: Payer: Commercial Managed Care - HMO | Attending: Audiology | Admitting: Audiology

## 2014-10-16 ENCOUNTER — Encounter: Payer: Self-pay | Admitting: Internal Medicine

## 2014-10-16 DIAGNOSIS — H9193 Unspecified hearing loss, bilateral: Secondary | ICD-10-CM | POA: Diagnosis not present

## 2014-10-16 DIAGNOSIS — H903 Sensorineural hearing loss, bilateral: Secondary | ICD-10-CM

## 2014-10-16 HISTORY — DX: Sensorineural hearing loss, bilateral: H90.3

## 2014-10-16 NOTE — Patient Instructions (Signed)
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  RECOMMENDATIONS:    1. Audiological re-evaluation in one year.  2. Darren Mcbride is no a hearing aid candidate at this time however there are ways to improve one's listening environment. Such as:  (A) When in a restaurant, sit with your back against a perimeter wall, thus reducing the extraneous noise.   (B) When in meetings, do not sit near an open doorway and position yourself within 8-10ft of the speaker. Front and center is typically best.  (C) Eliminate background noises at home such as the television, dishwasher, etc when having important conversations.  (D) Ask family members to face you when they speak and not to drop their heads or turn and walkway while still speaking.   3. Because Darren Mcbride has a weakened auditory system he is more at risk from damage of noise exposure than someone with a healthy auditory system and therefore should avoid excessive nose exposure if possible or certainly utilize ear protection.

## 2014-10-16 NOTE — Procedures (Signed)
   [facility] [address] [place], [place]  [address] [place] EVALUATION  Patient Name: Darren Mcbride  Medical Record Number:  [id number] Date of Birth:  [birth year]     Date of Test:  10/16/2014  HISTORY:  [name], a  54 y.o. old male was seen for audiological evaluation upon referral of KLIMA, Damita Lack, MD.   The patient has Autism Spectrum Disorder, Hypothyroidism and previous vocal cord cancer.  He does not report any difficulties with his hearing however his mother (primary care giver) feels he does not hear as well as he once did.  There is no report of middle ear pathology, tinnitus, headaches, dizziness or facial numbness.  [name] was accompanied by his brother who was the informant for the case history.   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  Speech reception thresholds were consistent with the pure tone results indicative of good test reliability.  Speech recognition testing was conducted in each ear independently, at a comfortable listening level (55dBHL) and indicated 100% and 100% in the right and left ears respectively.    Impedance audiometry was utilized and normal Type A Tympanograms were obtained on both sides supporting good middle ear function.   Acoustic reflexes were tested from 500Hz  - 4000Hz  and were present (althougth elevated) on the right side and present (although elevated) on the left side.  Distortion Product Otoacoustic Emissions were tested from 2000Hz  - 10000Hz  and were weak or absent on the right side and weak or absent on the left side, indicative of poor outer hair cell function within in the inner ear particularly in the higher frequencies.  This is consistent with what was observed during the pure  tone behavioral testing.  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  RECOMMENDATIONS:    1. Audiological re-evaluation in one year.  2. [name] is no a hearing aid candidate at this time however there are ways to improve one's listening environment. Such as:  (A) When in a restaurant, sit with your back against a perimeter wall, thus reducing the extraneous noise.   (B) When in meetings, do not sit near an open doorway and position yourself within 8-10ft of the speaker. Front and center is typically best.  (C) Eliminate background noises at home such as the television, dishwasher, etc when having important conversations.  (D) Ask family members to face you when they speak and not to drop their heads or turn and walkway while still speaking.   3. Because [name] has a weakened auditory system he is more at risk from damage of noise exposure than someone with a healthy auditory system and therefore should avoid excessive nose exposure if possible or certainly utilize ear protection.     Ivonne Andrew Lorris Carducci, Au.D. Doctor of Audiology CCC-A

## 2014-10-20 ENCOUNTER — Ambulatory Visit (INDEPENDENT_AMBULATORY_CARE_PROVIDER_SITE_OTHER): Payer: Commercial Managed Care - HMO | Admitting: Internal Medicine

## 2014-10-20 ENCOUNTER — Encounter: Payer: Self-pay | Admitting: Internal Medicine

## 2014-10-20 VITALS — BP 138/88 | HR 75 | Temp 98.6°F | Wt 156.5 lb

## 2014-10-20 DIAGNOSIS — Z1159 Encounter for screening for other viral diseases: Secondary | ICD-10-CM | POA: Diagnosis not present

## 2014-10-20 DIAGNOSIS — C320 Malignant neoplasm of glottis: Secondary | ICD-10-CM | POA: Diagnosis not present

## 2014-10-20 DIAGNOSIS — H903 Sensorineural hearing loss, bilateral: Secondary | ICD-10-CM | POA: Diagnosis not present

## 2014-10-20 DIAGNOSIS — E038 Other specified hypothyroidism: Secondary | ICD-10-CM | POA: Diagnosis not present

## 2014-10-20 DIAGNOSIS — Z114 Encounter for screening for human immunodeficiency virus [HIV]: Secondary | ICD-10-CM

## 2014-10-20 DIAGNOSIS — E039 Hypothyroidism, unspecified: Secondary | ICD-10-CM

## 2014-10-20 DIAGNOSIS — E2740 Unspecified adrenocortical insufficiency: Secondary | ICD-10-CM | POA: Diagnosis not present

## 2014-10-20 NOTE — Assessment & Plan Note (Signed)
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 Hearing aides were not felt to be necessary at this time. They recommended a referral back to audiology for audiometric testing in one year.

## 2014-10-20 NOTE — Patient Instructions (Signed)
It was great to see you again.  You are doing a very nice job caring for yourself.  1) Keep taking the medications like you are.  2) Do not add any salt to your foods.  Pepper is OK.  3) Remember, moderation in any food is important.  I will see you back in 6 months, sooner if necessary.

## 2014-10-20 NOTE — Assessment & Plan Note (Signed)
HIV screening was ordered at this visit and is pending at the time of this dictation. Otherwise he is up-to-date on his preventative health care maintenance and screening.

## 2014-10-20 NOTE — Assessment & Plan Note (Signed)
He has been tolerating the Florinef and hydrocortisone well and has no signs or symptoms suggestive of adrenal insufficiency. We will continue with this therapy.

## 2014-10-20 NOTE — Assessment & Plan Note (Signed)
Recent follow-up in radiation oncology revealed a normal-appearing vocal cords. We will continue with the recommended cancer survivor surveillance for vocal cord cancer as outlined by the radiation oncologists.

## 2014-10-20 NOTE — Assessment & Plan Note (Signed)
A TSH was checked in early January 2016 and found to be in the normal range. We will therefore continue the Synthroid 88 g daily.

## 2014-10-20 NOTE — Progress Notes (Signed)
   Subjective:    Patient ID: Darren Mcbride, male    DOB: [birth year], 54 y.o.   MRN: [id number]  HPI  Please see the A&P for the status of the pt's chronic medical problems.  Review of Systems  Constitutional: Negative for activity change, appetite change and unexpected weight change.  HENT: Negative for sore throat, trouble swallowing and voice change.   Respiratory: Negative for shortness of breath and wheezing.   Cardiovascular: Negative for chest pain, palpitations and leg swelling.  Gastrointestinal: Negative for nausea, vomiting, abdominal pain, diarrhea and constipation.  Musculoskeletal: Negative for arthralgias.  Skin: Negative for rash and wound.  Psychiatric/Behavioral: Negative for dysphoric mood and agitation. The patient is not nervous/anxious.       Objective:   Physical Exam  Constitutional: He is oriented to person, place, and time. He appears well-developed and well-nourished. No distress.  Eyes: Conjunctivae are normal. Right eye exhibits no discharge. Left eye exhibits no discharge. No scleral icterus.  Cardiovascular: Normal rate and regular rhythm.  Exam reveals no gallop and no friction rub.   Murmur heard. II/VI crescendo-decrescendo murmur best heard in the RUSB.  Pulmonary/Chest: Effort normal and breath sounds normal. No respiratory distress. He has no wheezes. He has no rales.  Abdominal: Soft. Bowel sounds are normal. He exhibits no distension. There is no tenderness. There is no rebound and no guarding.  Musculoskeletal: Normal range of motion. He exhibits no edema or tenderness.  Neurological: He is alert and oriented to person, place, and time. He exhibits normal muscle tone.  Skin: Skin is warm and dry. No rash noted. He is not diaphoretic. No erythema. No pallor.  Nursing note and vitals reviewed.     Assessment & Plan:   Please see problem oriented charting.

## 2014-10-21 LAB — HIV ANTIBODY (ROUTINE TESTING W REFLEX): HIV 1&2 Ab, 4th Generation: NONREACTIVE

## 2014-10-22 ENCOUNTER — Ambulatory Visit (INDEPENDENT_AMBULATORY_CARE_PROVIDER_SITE_OTHER): Payer: Commercial Managed Care - HMO | Admitting: Diagnostic Neuroimaging

## 2014-10-22 ENCOUNTER — Encounter: Payer: Self-pay | Admitting: Diagnostic Neuroimaging

## 2014-10-22 VITALS — BP 132/86 | HR 84 | Ht 69.0 in | Wt 157.2 lb

## 2014-10-22 DIAGNOSIS — R4189 Other symptoms and signs involving cognitive functions and awareness: Secondary | ICD-10-CM | POA: Diagnosis not present

## 2014-10-22 DIAGNOSIS — F840 Autistic disorder: Secondary | ICD-10-CM

## 2014-10-22 NOTE — Progress Notes (Signed)
[facility]  PATIENT: Darren Mcbride DOB: Oct 25, 1960  REFERRING CLINICIAN: Mallory HISTORY FROM: patient and mother  REASON FOR VISIT: follow up    HISTORICAL  CHIEF COMPLAINT:  Chief Complaint  Patient presents with  . New Evaluation    hospital recommended that he come for mental status changes and visual issues     HISTORY OF PRESENT ILLNESS:   UPDATE 10/22/14: Patient presents for follow-up. Patient denies any memory or cognitive problems. He is somewhat soft-spoken with paucity of speech. His mother states that he continues to have aggressive memory and thinking problems. She states that these have started just after radiation therapy, in September 2015. When I asked about the evaluation 2014 she states that some memory problems may have started around then. In review of prior records, patient's mother had complained of him having progressive cognitive difficulties as far back as 2008. During my interview patient's mother admitted that she herself is having some memory problems and is not exactly sure of the timing of these cognitive problems and declined. Patient still able [place] and go to school at [employer] every day, 5 days per week. Patient has been found to have some hearing loss, but was advised to hold off on hearing aids. Patient's mother complains that he does not hear what she says, forgets recent conversations, and is also behaving somewhat unsafe manner such as burning himself on the stove and oven, touching things which he should not touch. Patient's mother is getting some additional help through [employer], which sounds like a home visiting nurse. If something were to happen to patient's mother, patient's cousin and his wife have been signed/designated to become guardian for patient.  UPDATE 07/04/13 Darren Mcbride): "Mom notes he continues to have difficulty with processing information, trouble interacting/doing day to day  things.Feels it is getting progressively worse. Continues to go to day school but mom is unsure if he is doing his work there. He is watching a lot of TV. Has had lab workup and MRI brain which was unremarkable since his last visit. Was recently evaluated and started on a PPI for reflux which they though might be causing the dysphonia."  PRIOR HPI (03/14/13, Darren Mcbride): "Darren Mcbride is a pleasant 54 year old African American gentleman with an at birth diagnosis of autism presenting for initial evaluation of cognitive decline. He presents today with his mother who gives the majority of the history. His remote history of autism including the diagnosis is unclear based on history his mother provided. Per the mother he was diagnosed with autism at birth, he was noted to have a complicated delivery. He spent the first 6 to 7 years of his life at [place] [facility], he then lived with foster family briefly before returning to live with his mother at age 54. He has lived with her since then. He finished school through the ninth grade, but has been going to an autism school for the past 17 years, and the school he does arts crafts math and reading. He is held a few different jobs in the past, he worked at [employer], and and recently worked Designer, industrial/product. His mother notes that his cognitive problems started around 5 years ago around the time he was diagnosed with hypothyroidism. She initially noticed he started having trouble with short term memory, trouble following directions, trouble remembering where things go around the house. She denies any trouble with remote memory.  They have noticed increased difficulty in performing ADLs. Initially this was a slowly progressive problem, and the past year she feels it as markedly increased. His teacher at the autism school also has expressed concern that he has began to struggle in school compared to prior  performance, he is having more difficulty participating in activities. There have not been any outbursts agitation or anger issues. In the past year his mom has also noticed progressive worsening of his voice, described as getting lower and harder to understand. During this time he has complained of a sore throat correlating with the voice change. Mom does feel the voice is gone slightly stronger easier to understand in the past few months. Recently he was found to have difficulty with hearing, secondary to likely cerumen impaction. This has improved with removal of the cerumen."   REVIEW OF SYSTEMS: Full 14 system review of systems performed and notable only for snoring blurred vision memory loss confusion slurred speech.  ALLERGIES: No Known Allergies  HOME MEDICATIONS: Outpatient Prescriptions Prior to Visit  Medication Sig Dispense Refill  . fludrocortisone (FLORINEF) 0.1 MG tablet Take 1 tablet (0.1 mg total) by mouth daily. 90 tablet 3  . hydrocortisone (CORTEF) 20 MG tablet Take 1 tablet (20 mg total) by mouth daily. 90 tablet 3  . levothyroxine (SYNTHROID, LEVOTHROID) 88 MCG tablet Take 1 tablet (88 mcg total) by mouth daily. 90 tablet 3   No facility-administered medications prior to visit.    PAST MEDICAL HISTORY: Past Medical History  Diagnosis Date  . Autism spectrum disorder 09/22/2011  . 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 Always had stable electrolytes.    . Hypothyroidism 06/30/2006  . Allergic rhinitis 02/06/2013  . Gastroesophageal reflux disease 05/24/2013  . 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 Varicose veins of lower extremities  02/06/2013  . Bilateral cataracts     s/p bilateral extraction  . Onychomycosis of toenail 08/30/2013  . 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  History of radiation therapy 10/28/13-12/04/13    vocal cord,63Gy/45fx  . Bilateral high frequency sensorineural hearing loss 10/16/2014    Mild to Moderate on Audiology examination 10/16/2014.  No hearing aides felt needed.    PAST SURGICAL HISTORY: Past Surgical History  Procedure Laterality Date  . Hernia repair      @ birth  . Eye surgery      Bilateral cataract extraction  . Colon polyps  3-18=14    removal of colon growths"colon polyps"  . Colonoscopy with propofol N/A 11/08/2012    Procedure: COLONOSCOPY WITH PROPOFOL;  Surgeon: Milus Banister, MD;  Location: [facility];  Service: Endoscopy;  Laterality: N/A;    FAMILY HISTORY: Family History  Problem Relation Age of Onset  . Diabetes Mother   . Hypertension Mother   . Cataracts Mother   . Breast cancer Mother 18    1985, surgery, chemo and XRT  . Prostate cancer Paternal Uncle   . Schizophrenia Sister   . Diabetes insipidus Sister   . Alcoholism Father   . Alcoholism Brother   . Alcoholism Brother   . Alcoholism Brother   . Congestive Heart Failure Brother   . Colon cancer Neg Hx   . Esophageal cancer Neg Hx   . Rectal cancer Neg Hx   . Stomach cancer  Neg Hx     SOCIAL HISTORY:  History   Social History  . Marital Status: Single    Spouse Name: N/A  . Number of Children: 0  . Years of Education: 9th   Occupational History  . student    Social History Main Topics  . Smoking status: Never Smoker   . Smokeless tobacco: Never Used  . Alcohol Use: No  . Drug Use: No  . Sexual Activity: Not on file   Other Topics Concern  . Not on file   Social History Narrative   Patient lives with his mother at home.   Patient is left-handed.   Patient drinks 2 cups of soda daily at home.   Patient is attending [employer].           PHYSICAL EXAM  Filed Vitals:   10/22/14 1047  BP: 132/86  Pulse: 84  Height: 5\' 9"  (1.753 m)  Weight: 157 lb 3.2 oz (71.305 kg)    Body mass index is 23.2 kg/(m^2).  No exam  data present  No flowsheet data found.  GENERAL EXAM: Patient is in no distress; well developed, nourished and groomed; neck is supple  CARDIOVASCULAR: Regular rate and rhythm, no murmurs, no carotid bruits  NEUROLOGIC: MENTAL STATUS: awake, alert, oriented to person, place and time ("October 22, 2014, Wednesday), DECR FLUENCY; comprehension intact, naming intact, fund of knowledge appropriate CRANIAL NERVE: pupils equal and reactive to light, visual fields full to confrontation, EXOTROPIA, extraocular muscles intact, no nystagmus, facial sensation and strength symmetric, hearing intact, palate elevates symmetrically, uvula midline, shoulder shrug symmetric, tongue midline. MOTOR: normal bulk and tone, full strength in the BUE, BLE; HANDS FLEXED, DYSTONIC POSTURE SENSORY: normal and symmetric to light touch COORDINATION: finger-nose-finger, fine finger movements normal REFLEXES: deep tendon reflexes present and symmetric GAIT/STATION: narrow based gait; HEAD TILTED TO LEFT, STOOPED POSTURE, ARMS/HANDS FLEXED WHILE WALKING; SHORT STEPS    DIAGNOSTIC DATA (LABS, IMAGING, TESTING) - I reviewed patient records, labs, notes, testing and imaging myself where available.  Lab Results  Component Value Date   WBC 4.3 03/07/2014   HGB 10.9* 03/07/2014   HCT 35.4* 03/07/2014   MCV 77.6* 03/07/2014   PLT 120* 03/07/2014      Component Value Date/Time   NA 142 03/07/2014 1101   K 3.9 03/07/2014 1101   CL 103 03/07/2014 1101   CO2 32 03/07/2014 1101   GLUCOSE 67* 03/07/2014 1101   BUN 15 03/07/2014 1101   CREATININE 1.01 03/07/2014 1101   CREATININE 0.99 01/04/2010 1853   CALCIUM 9.4 03/07/2014 1101   PROT 7.1 03/07/2014 1101   PROT 6.9 03/14/2013 1035   ALBUMIN 4.2 03/07/2014 1101   AST 19 03/07/2014 1101   ALT 18 03/07/2014 1101   ALKPHOS 63 03/07/2014 1101   BILITOT 0.5 03/07/2014 1101   GFRNONAA 84 03/07/2014 1101   GFRNONAA >60 01/14/2009 2020   GFRAA >89 03/07/2014 1101    GFRAA >60 01/14/2009 2020   Lab Results  Component Value Date   CHOL 125 05/24/2013   HDL 47 05/24/2013   LDLCALC 69 05/24/2013   TRIG 43 05/24/2013   CHOLHDL 2.7 05/24/2013   No results found for: HGBA1C Lab Results  Component Value Date   VWUJWJXB14 782 02/22/2013   Lab Results  Component Value Date   TSH 1.377 08/22/2014   I reviewed images myself and agree with interpretation. -VRP  03/20/13 MRI brain  1. Small chronic lacunar infarcts in the bilateral putaminal and caudate regions.  2. No acute findings.  07/18/13 EEG - normal      ASSESSMENT AND PLAN  53 y.o. year old male here with autism, with progressive cognitive decline over past 5-10 years. Of note patient's mother who is the primary informant during this evaluation, herself is having memory problems, and it is apparent to me that she is not clear on the timeline of the patient's cognitive decline. Apparently patient's teachers at [employer] have noticed some decline over past 2 years. Patient also has some hearing loss which could be contributory to his cognitive performance on a day-to-day basis.  He has had MRI of the brain as well as lab and EEG testing in the past which have been unremarkable. I do not recommend any further testing.   In conclusion patient has had lifelong mild cognitive difficulties related to autism disorder, with some decline over the past 5-10 years which is of unclear etiology. This could represent a neurodegenerative process, hearing loss related cognitive issues, normal aging or behavior/mood related cognitive dysfunction. Recommend close supervision and support for patient. I am concerned about patient's aging and ailing mother who herself has some physical, medical and cognitive issues. She has indicated that patient's cousin and his wife would be the next in line for guardianship/power of attorney.  PLAN:  - Return for return to PCP.    Penni Bombard, MD 04/22/3381, 50:53 PM Certified  in Neurology, Neurophysiology and Neuroimaging  [facility] [address], [place], [place] [address] [phone]

## 2014-10-24 ENCOUNTER — Ambulatory Visit: Payer: Self-pay | Admitting: Internal Medicine

## 2014-10-29 NOTE — Progress Notes (Signed)
HIV Screen: Negative.  Attempted to call with result but received unidentified answering machine, so message was not left.

## 2014-12-11 ENCOUNTER — Encounter: Payer: Self-pay | Admitting: Internal Medicine

## 2014-12-11 ENCOUNTER — Ambulatory Visit (INDEPENDENT_AMBULATORY_CARE_PROVIDER_SITE_OTHER): Payer: Commercial Managed Care - HMO | Admitting: Internal Medicine

## 2014-12-11 VITALS — BP 134/87 | HR 100 | Temp 98.2°F | Wt 154.3 lb

## 2014-12-11 DIAGNOSIS — E038 Other specified hypothyroidism: Secondary | ICD-10-CM

## 2014-12-11 DIAGNOSIS — Z23 Encounter for immunization: Secondary | ICD-10-CM

## 2014-12-11 DIAGNOSIS — Z8521 Personal history of malignant neoplasm of larynx: Secondary | ICD-10-CM

## 2014-12-11 DIAGNOSIS — E2740 Unspecified adrenocortical insufficiency: Secondary | ICD-10-CM | POA: Diagnosis not present

## 2014-12-11 DIAGNOSIS — F840 Autistic disorder: Secondary | ICD-10-CM | POA: Diagnosis not present

## 2014-12-11 DIAGNOSIS — E039 Hypothyroidism, unspecified: Secondary | ICD-10-CM

## 2014-12-11 NOTE — Assessment & Plan Note (Signed)
Darren Mcbride cognitive decline has stabilize. He was seen by neurology for this and no other evaluation was felt to be necessary. It was felt to be likely secondary to either decreased hearing or normal progression of his underlying autism spectrum disorder. We will continue to follow this clinically.

## 2014-12-11 NOTE — Assessment & Plan Note (Signed)
Because of his underlying history of cancer he was felt to be somewhat immunocompromised and would be a candidate for the Prevnar 13 Pneumovax. He therefore received this today. He is otherwise up-to-date on his health care maintenance.

## 2014-12-11 NOTE — Assessment & Plan Note (Signed)
He has had no change in his voice. He will be periodically assessed by radiation oncology. At the last evaluation he was felt to be disease free.

## 2014-12-11 NOTE — Progress Notes (Signed)
   Subjective:    Patient ID: Darren Mcbride, male    DOB: [birth year], 54 y.o.   MRN: [id number]  HPI  Darren Mcbride is here for follow-up of his autism spectrum disorder, hypothyroidism, adrenal insufficiency, and personal history of vocal cord cancer. Please see the A&P for the status of the pt's chronic medical problems.  Review of Systems  Constitutional: Negative for activity change, appetite change and unexpected weight change.  HENT: Negative for mouth sores, sore throat, trouble swallowing and voice change.   Respiratory: Negative for shortness of breath and wheezing.   Cardiovascular: Negative for chest pain, palpitations and leg swelling.  Gastrointestinal: Negative for nausea, vomiting, abdominal pain, diarrhea and constipation.  Musculoskeletal: Negative for myalgias and arthralgias.  Neurological: Negative for dizziness and weakness.  Psychiatric/Behavioral: Negative for dysphoric mood. The patient is not nervous/anxious.       Objective:   Physical Exam  Constitutional: He appears well-developed and well-nourished. No distress.  HENT:  Head: Normocephalic and atraumatic.  Eyes: Conjunctivae are normal. Right eye exhibits no discharge. Left eye exhibits no discharge. No scleral icterus.  Cardiovascular: Normal rate and regular rhythm.  Exam reveals no gallop and no friction rub.   Murmur heard. Pulmonary/Chest: Effort normal and breath sounds normal. No respiratory distress. He has no wheezes. He has no rales.  Abdominal: Soft. Bowel sounds are normal. He exhibits no distension. There is no tenderness. There is no rebound and no guarding.  Musculoskeletal: Normal range of motion. He exhibits no edema or tenderness.  Neurological: He is alert. He exhibits normal muscle tone.  Skin: Skin is warm and dry. No rash noted. He is not diaphoretic. No erythema.  Psychiatric: He has a normal mood and affect.  Nursing note and vitals reviewed.     Assessment & Plan:   Please  see problem oriented charting.

## 2014-12-11 NOTE — Patient Instructions (Signed)
It was great to see you again.  You are doing well with your weight and blood pressure.  Please keep up the good work!  1)  Keep taking your medications as you are.  2) We gave you the PCV 13 shot today.  I will see you back in 6 months, sooner if necessary.

## 2014-12-11 NOTE — Assessment & Plan Note (Signed)
He specifically denies constipation or dry skin. A TSH 4 months ago was within the normal range. We will therefore continue the Synthroid 88 g by mouth daily.

## 2014-12-11 NOTE — Assessment & Plan Note (Signed)
He is without any signs or symptoms suggestive of decompensated adrenal insufficiency. Recent electrolytes have all been unremarkable. We will therefore continue his fludrocortisone at 0.1 mg by mouth daily and hydrocortisone at 20 mg by mouth daily.

## 2014-12-12 ENCOUNTER — Ambulatory Visit: Payer: Self-pay | Admitting: Internal Medicine

## 2014-12-29 ENCOUNTER — Other Ambulatory Visit: Payer: Self-pay | Admitting: Internal Medicine

## 2014-12-29 DIAGNOSIS — E2740 Unspecified adrenocortical insufficiency: Secondary | ICD-10-CM

## 2015-01-06 ENCOUNTER — Other Ambulatory Visit: Payer: Self-pay | Admitting: Internal Medicine

## 2015-01-06 DIAGNOSIS — E2740 Unspecified adrenocortical insufficiency: Secondary | ICD-10-CM

## 2015-02-09 ENCOUNTER — Other Ambulatory Visit: Payer: Self-pay | Admitting: Internal Medicine

## 2015-02-09 DIAGNOSIS — E039 Hypothyroidism, unspecified: Secondary | ICD-10-CM

## 2015-02-18 ENCOUNTER — Encounter: Payer: Self-pay | Admitting: Radiation Oncology

## 2015-02-18 ENCOUNTER — Ambulatory Visit
Admission: RE | Admit: 2015-02-18 | Discharge: 2015-02-18 | Disposition: A | Discharge status: Home / Self Care | Payer: Commercial Managed Care - HMO | Source: Ambulatory Visit | Attending: Radiation Oncology | Admitting: Radiation Oncology

## 2015-02-18 VITALS — BP 127/89 | HR 80 | Temp 98.5°F | Resp 20 | Ht 69.0 in | Wt 150.6 lb

## 2015-02-18 DIAGNOSIS — Z8521 Personal history of malignant neoplasm of larynx: Secondary | ICD-10-CM | POA: Insufficient documentation

## 2015-02-18 MED ORDER — LARYNGOSCOPY SOLUTION RAD-ONC
15.0000 mL | Freq: Once | TOPICAL | Status: AC
  Administered 2015-02-18: 15 mL via TOPICAL
  Filled 2015-02-18: qty 15

## 2015-02-18 NOTE — Progress Notes (Signed)
  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  The patient's family and the patient states that he has continued to do well. He denies any difficulty swallowing on a regular basis or worsening basis. No pain in the throat area. The patient's mother feels that his voice is soft but no major changes in this.  ALLERGIES:  has No Known Allergies.  Meds: Current Outpatient Prescriptions  Medication Sig Dispense Refill  . fludrocortisone (FLORINEF) 0.1 MG tablet Take 1 tablet (0.1 mg total) by mouth daily. 90 tablet 3  . hydrocortisone (CORTEF) 20 MG tablet Take 1 tablet (20 mg total) by mouth daily. 90 tablet 3  . levothyroxine (SYNTHROID, LEVOTHROID) 88 MCG tablet Take 1 tablet (88 mcg total) by mouth daily. 90 tablet 3   No current facility-administered medications for this encounter.    Physical Findings: The patient is in no acute distress. Patient is alert and oriented.  height is 5\' 9"  (1.753 m) and weight is 150 lb 9.6 oz (68.312 kg). His oral temperature is 98.5 F (36.9 C). His blood pressure is 127/89 and his pulse is 80. His respiration is 20 and oxygen saturation is 96%. .   Neck has healed well. Radiation changes remain. Mild fibrosis present. No lymphadenopathy Oral cavity clear  Fiberoptic exam: After the use of topical anesthetic, the flexible laryngoscope was passed through the right near. Good visualization was obtained of the vocal cord region. No lesions or suspicious findings within the larynx, hypopharynx, oropharynx or nasopharynx.   Lab Findings: Lab Results  Component Value Date   WBC  4.3 03/07/2014   HGB 10.9* 03/07/2014   HCT 35.4* 03/07/2014   MCV 77.6* 03/07/2014   PLT 120* 03/07/2014     Radiographic Findings: No results found.  Impression:    The patient is doing well. He remains clinically NED.  Plan:  The patient will return to clinic in 6 months for ongoing followup.  I spent 15 minutes with the patient today, the majority of which was spent counseling the patient on the diagnosis of cancer and coordinating care.   Jodelle Gross, M.D., Ph.D.

## 2015-02-18 NOTE — Progress Notes (Signed)
Follow up s/p rad tx vocal cord 10/28/13-12/04/13, no c.o pain, nausea, does have dry mouth, uses mouth wash listerine daily, appetite good, no diffioculty swallowing food or fluids stated 1:51 PM BP 127/89 mmHg  Pulse 80  Temp(Src) 98.5 F (36.9 C) (Oral)  Resp 20  Ht 5\' 9"  (1.753 m)  Wt 150 lb 9.6 oz (68.312 kg)  BMI 22.23 kg/m2  SpO2 96%  Wt Readings from Last 3 Encounters:  02/18/15 150 lb 9.6 oz (68.312 kg)  12/11/14 154 lb 4.8 oz (69.99 kg)  10/22/14 157 lb 3.2 oz (71.305 kg)

## 2015-02-18 NOTE — Progress Notes (Signed)
Patient given laryngoscopic solution both nostrils, set up scope for MD 2:05 PM

## 2015-04-28 ENCOUNTER — Telehealth: Payer: Self-pay | Admitting: Licensed Clinical Social Worker

## 2015-04-28 NOTE — Telephone Encounter (Signed)
CSW received call from pt's mother with concern that she has not heard from the [place] office regarding her's sons application.  Pt in need of [employer] for transportation to/from school program.  [place] office informed mother forms have been faxed.  CSW provided mother with the number to the [employer] eligibility office and requested mother to follow up to determine if there is additional information or if agency received fax.  CSW informed mother if needed CSW can re-fax application if needed.   Mother returned call stating eligibility staff is out of the office and can not confirm if application has been received.  CSW will re-fax and note on fax cover.  Mother in agreement. Part A and Part B faxed.

## 2015-06-19 ENCOUNTER — Encounter: Payer: Self-pay | Admitting: Internal Medicine

## 2015-06-19 ENCOUNTER — Ambulatory Visit (INDEPENDENT_AMBULATORY_CARE_PROVIDER_SITE_OTHER): Payer: Commercial Managed Care - HMO | Admitting: Internal Medicine

## 2015-06-19 ENCOUNTER — Ambulatory Visit (HOSPITAL_COMMUNITY)
Admission: RE | Admit: 2015-06-19 | Discharge: 2015-06-19 | Disposition: A | Discharge status: Home / Self Care | Payer: Commercial Managed Care - HMO | Source: Ambulatory Visit | Attending: Internal Medicine | Admitting: Internal Medicine

## 2015-06-19 VITALS — BP 118/84 | HR 81 | Temp 98.2°F | Wt 151.5 lb

## 2015-06-19 DIAGNOSIS — I499 Cardiac arrhythmia, unspecified: Secondary | ICD-10-CM | POA: Diagnosis not present

## 2015-06-19 DIAGNOSIS — J3089 Other allergic rhinitis: Secondary | ICD-10-CM

## 2015-06-19 DIAGNOSIS — E039 Hypothyroidism, unspecified: Secondary | ICD-10-CM

## 2015-06-19 DIAGNOSIS — E038 Other specified hypothyroidism: Secondary | ICD-10-CM | POA: Diagnosis not present

## 2015-06-19 DIAGNOSIS — J309 Allergic rhinitis, unspecified: Secondary | ICD-10-CM

## 2015-06-19 DIAGNOSIS — Z8521 Personal history of malignant neoplasm of larynx: Secondary | ICD-10-CM | POA: Diagnosis not present

## 2015-06-19 DIAGNOSIS — Z1159 Encounter for screening for other viral diseases: Secondary | ICD-10-CM

## 2015-06-19 DIAGNOSIS — E2740 Unspecified adrenocortical insufficiency: Secondary | ICD-10-CM | POA: Diagnosis not present

## 2015-06-19 DIAGNOSIS — Z0000 Encounter for general adult medical examination without abnormal findings: Secondary | ICD-10-CM

## 2015-06-19 NOTE — Assessment & Plan Note (Signed)
He received the flu shot today. We also obtained a hepatitis C antibody to screen for hepatitis C given he is in the at risk age cohort. This result is pending at the time of this dictation. He is otherwise up-to-date with his periodic health maintenance.

## 2015-06-19 NOTE — Progress Notes (Signed)
   Subjective:    Patient ID: Darren Mcbride, male    DOB: March 16, 1961, 54 y.o.   MRN: [id number]  HPI  Hugo Lybrand is here for follow-up of his adrenal insufficiency, hypothyroidism, and autism spectrum disorder. Please see the A&P for the status of the pt's chronic medical problems.  Review of Systems  Constitutional: Negative for activity change, appetite change and unexpected weight change.  Respiratory: Negative for shortness of breath.   Cardiovascular: Negative for chest pain and leg swelling.  Gastrointestinal: Positive for constipation. Negative for nausea, vomiting, abdominal pain and diarrhea.  Endocrine: Positive for cold intolerance.  Musculoskeletal: Negative for myalgias and arthralgias.  Skin: Negative for color change, rash and wound.  Neurological: Negative for weakness.  Psychiatric/Behavioral: Negative for behavioral problems, dysphoric mood and agitation. The patient is not nervous/anxious and is not hyperactive.       Objective:   Physical Exam  Constitutional: He is oriented to person, place, and time. He appears well-developed and well-nourished. No distress.  HENT:  Head: Atraumatic.  Eyes: Conjunctivae are normal. Right eye exhibits no discharge. Left eye exhibits no discharge. No scleral icterus.  Cardiovascular: Normal rate.  Exam reveals no gallop and no friction rub.   No murmur heard. Irregular rythym  Pulmonary/Chest: Effort normal and breath sounds normal. No respiratory distress. He has no wheezes. He has no rales.  Abdominal: Soft. Bowel sounds are normal. He exhibits no distension. There is no tenderness. There is no rebound and no guarding.  Musculoskeletal: Normal range of motion. He exhibits no edema or tenderness.  Neurological: He is alert and oriented to person, place, and time.  Skin: Skin is warm and dry. No rash noted. He is not diaphoretic. No erythema.  Psychiatric: He has a normal mood and affect. His behavior is normal.  Nursing note  and vitals reviewed.     Assessment & Plan:   Please see problem oriented charting.

## 2015-06-19 NOTE — Assessment & Plan Note (Signed)
On examination he was noted to have an irregular heart rhythm. An ECG was obtained and revealed normal sinus rhythm at 76 bpm with a mild right axis deviation, left atrial enlargement, frequent premature ventricular contractions, LVH by voltage in the precordial leads, good R wave progression, and no ST or T-wave changes. Other than the premature ventricular contractions this ECG is unchanged from the prior tracing from 11/08/2012. No further assessment or therapy are necessary for these asymptomatic premature ventricular contractions.

## 2015-06-19 NOTE — Assessment & Plan Note (Signed)
Darren Mcbride states he is compliant with his fludrocortisone and hydrocortisone. He denies any dizziness. If basic metabolic panel was obtained today and is pending at the time of this dictation. He is asked to continue the fludrocortisone at 0.1 mg by mouth daily and hydrocortisone at 20 mg by mouth daily. We will reassess for signs and symptoms of adrenal insufficiency at the follow-up visit.

## 2015-06-19 NOTE — Assessment & Plan Note (Signed)
His mother feels his voice is fine and unchanged from baseline. He is followed by radiation oncology. At the last visit with them fiberoptic laryngoscopy was unremarkable. He will continue periodic follow-up with radiation oncology.

## 2015-06-19 NOTE — Patient Instructions (Signed)
It was good to see you again.  You are taking good care of yourself.  1)  Keep taking the medications as you are.  2) We checked some blood today.  I will call you next week if there are any concerns.  3) We gave you a flu shot today.  4) We checked a heart tracing today.  It was good.  5) I will see you in 6 months, sooner if necessary.

## 2015-06-19 NOTE — Assessment & Plan Note (Signed)
He denies any current symptoms consistent with allergic rhinitis. We will reassess at follow-up visit.

## 2015-06-19 NOTE — Assessment & Plan Note (Signed)
His mother notes that he is slower in movement and thinking than usual. This has been going on for several months. Mr. Drapeau notes that he does have some constipation, dry skin, and cold intolerance. All of these are concerning for hypothyroidism not adequately treated with the current dose of Synthroid. A TSH was obtained today and is pending at the time of this dictation. If abnormal it may explain the symptoms and a dose adjustment will be made. In the interim, we will continue the Synthroid at the current dose of 88 g by mouth daily.

## 2015-06-20 ENCOUNTER — Emergency Department (HOSPITAL_COMMUNITY)
Admission: EM | Admit: 2015-06-20 | Discharge: 2015-06-20 | Disposition: A | Discharge status: Home / Self Care | Payer: Commercial Managed Care - HMO | Attending: Emergency Medicine | Admitting: Emergency Medicine

## 2015-06-20 ENCOUNTER — Encounter (HOSPITAL_COMMUNITY): Payer: Self-pay | Admitting: Emergency Medicine

## 2015-06-20 ENCOUNTER — Emergency Department (HOSPITAL_COMMUNITY): Payer: Commercial Managed Care - HMO

## 2015-06-20 DIAGNOSIS — Z79899 Other long term (current) drug therapy: Secondary | ICD-10-CM | POA: Diagnosis not present

## 2015-06-20 DIAGNOSIS — E079 Disorder of thyroid, unspecified: Secondary | ICD-10-CM | POA: Insufficient documentation

## 2015-06-20 DIAGNOSIS — K529 Noninfective gastroenteritis and colitis, unspecified: Secondary | ICD-10-CM | POA: Diagnosis not present

## 2015-06-20 DIAGNOSIS — Z87891 Personal history of nicotine dependence: Secondary | ICD-10-CM | POA: Diagnosis not present

## 2015-06-20 DIAGNOSIS — Z7952 Long term (current) use of systemic steroids: Secondary | ICD-10-CM | POA: Insufficient documentation

## 2015-06-20 DIAGNOSIS — R112 Nausea with vomiting, unspecified: Secondary | ICD-10-CM | POA: Diagnosis present

## 2015-06-20 DIAGNOSIS — R110 Nausea: Secondary | ICD-10-CM | POA: Diagnosis not present

## 2015-06-20 DIAGNOSIS — Z859 Personal history of malignant neoplasm, unspecified: Secondary | ICD-10-CM | POA: Diagnosis not present

## 2015-06-20 DIAGNOSIS — R0602 Shortness of breath: Secondary | ICD-10-CM | POA: Diagnosis not present

## 2015-06-20 DIAGNOSIS — R109 Unspecified abdominal pain: Secondary | ICD-10-CM | POA: Insufficient documentation

## 2015-06-20 HISTORY — DX: Disorder of thyroid, unspecified: E07.9

## 2015-06-20 LAB — COMPREHENSIVE METABOLIC PANEL
ALBUMIN: 3.7 g/dL (ref 3.5–5.0)
ALK PHOS: 69 U/L (ref 38–126)
ALT: 13 U/L — ABNORMAL LOW (ref 17–63)
AST: 20 U/L (ref 15–41)
Anion gap: 6 (ref 5–15)
BILIRUBIN TOTAL: 1 mg/dL (ref 0.3–1.2)
BUN: 13 mg/dL (ref 6–20)
CALCIUM: 8.8 mg/dL — AB (ref 8.9–10.3)
CO2: 32 mmol/L (ref 22–32)
Chloride: 103 mmol/L (ref 101–111)
Creatinine, Ser: 0.95 mg/dL (ref 0.61–1.24)
GFR calc Af Amer: >60 mL/min (ref >60)
GFR calc non Af Amer: >60 mL/min (ref >60)
GLUCOSE: 111 mg/dL — AB (ref 65–99)
POTASSIUM: 3.7 mmol/L (ref 3.5–5.1)
SODIUM: 141 mmol/L (ref 135–145)
TOTAL PROTEIN: 6.6 g/dL (ref 6.5–8.1)

## 2015-06-20 LAB — BMP8+ANION GAP
ANION GAP: 15 mmol/L (ref 10.0–18.0)
BUN/Creatinine Ratio: 13 (ref 9–20)
BUN: 15 mg/dL (ref 6–24)
CALCIUM: 9.3 mg/dL (ref 8.7–10.2)
CO2: 28 mmol/L (ref 18–29)
CREATININE: 1.13 mg/dL (ref 0.76–1.27)
Chloride: 102 mmol/L (ref 97–106)
GFR calc Af Amer: 84 mL/min/{1.73_m2} (ref >59)
GFR, EST NON AFRICAN AMERICAN: 73 mL/min/{1.73_m2} (ref >59)
Glucose: 83 mg/dL (ref 65–99)
POTASSIUM: 4.3 mmol/L (ref 3.5–5.2)
SODIUM: 145 mmol/L — AB (ref 136–144)

## 2015-06-20 LAB — CBC WITH DIFFERENTIAL/PLATELET
BASOS PCT: 0 %
Basophils Absolute: 0 10*3/uL (ref 0.0–0.1)
Eosinophils Absolute: 0 10*3/uL (ref 0.0–0.7)
Eosinophils Relative: 0 %
HEMATOCRIT: 39.3 % (ref 39.0–52.0)
HEMOGLOBIN: 11.9 g/dL — AB (ref 13.0–17.0)
LYMPHS ABS: 0.3 10*3/uL — AB (ref 0.7–4.0)
Lymphocytes Relative: 5 %
MCH: 24.1 pg — AB (ref 26.0–34.0)
MCHC: 30.3 g/dL (ref 30.0–36.0)
MCV: 79.6 fL (ref 78.0–100.0)
MONO ABS: 0.4 10*3/uL (ref 0.1–1.0)
MONOS PCT: 7 %
NEUTROS ABS: 5.2 10*3/uL (ref 1.7–7.7)
NEUTROS PCT: 88 %
Platelets: 92 10*3/uL — ABNORMAL LOW (ref 150–400)
RBC: 4.94 MIL/uL (ref 4.22–5.81)
RDW: 14.3 % (ref 11.5–15.5)
WBC: 5.9 10*3/uL (ref 4.0–10.5)

## 2015-06-20 LAB — LIPASE, BLOOD: Lipase: 17 U/L (ref 11–51)

## 2015-06-20 LAB — TSH: TSH: 2.37 u[IU]/mL (ref 0.450–4.500)

## 2015-06-20 LAB — HEPATITIS C ANTIBODY: Hep C Virus Ab: 0.2 s/co ratio (ref 0.0–0.9)

## 2015-06-20 MED ORDER — SODIUM CHLORIDE 0.9 % IV SOLN
INTRAVENOUS | Status: DC
  Administered 2015-06-20: 11:00:00 via INTRAVENOUS

## 2015-06-20 MED ORDER — ONDANSETRON 4 MG PO TBDP: 4.0000 mg | ORAL_TABLET | Freq: Three times a day (TID) | ORAL | Status: DC | PRN

## 2015-06-20 MED ORDER — IOHEXOL 300 MG/ML  SOLN
100.0000 mL | Freq: Once | INTRAMUSCULAR | Status: AC | PRN
  Administered 2015-06-20: 100 mL via INTRAVENOUS

## 2015-06-20 MED ORDER — SODIUM CHLORIDE 0.9 % IV BOLUS (SEPSIS)
500.0000 mL | Freq: Once | INTRAVENOUS | Status: AC
  Administered 2015-06-20: 500 mL via INTRAVENOUS

## 2015-06-20 MED ORDER — ONDANSETRON HCL 4 MG/2ML IJ SOLN
4.0000 mg | Freq: Once | INTRAMUSCULAR | Status: AC
  Administered 2015-06-20: 4 mg via INTRAVENOUS
  Filled 2015-06-20: qty 2

## 2015-06-20 NOTE — ED Notes (Signed)
Vomiting since last night/early this morning. Denies abdominal pain.

## 2015-06-20 NOTE — ED Notes (Signed)
Sat monitor reading 84-88% with good pleth. Patient checked, no resp distress, no rapid breathing, denies SOB, denies vomiting since arrival, lung fields clear, equipment seems to be working, pleth good. Placed on 2L [place], responded to 95%. Notified Dr. Rogene Houston.

## 2015-06-20 NOTE — Discharge Instructions (Signed)
Return for any new or worse symptoms. Take Zofran as needed for the nausea and vomiting. Make appointment to follow-up with your record doctors. Today's workup to include labs chest x-ray and CT scan without significant abnormalities. Feel that this is probably a viral gastroenteritis.

## 2015-06-20 NOTE — ED Provider Notes (Addendum)
CSN: [id number]     Arrival date & time 06/20/15  3009 History   First MD Initiated Contact with Patient 06/20/15 [phone]     Chief Complaint  Patient presents with  . Emesis     (Consider location/radiation/quality/duration/timing/severity/associated sxs/prior Treatment) The history is provided by the patient.   54 year old male followed by outpatient clinic. Patient with onset of nausea vomiting and diarrhea after midnight today. Associated with some abdominal pain.  No blood in the vomit or the diarrhea. Patient never had any symptoms like this before. Patient's had multiple episodes of both.  Past Medical History  Diagnosis Date  . Thyroid disease   . Cancer [facility])    Past Surgical History  Procedure Laterality Date  . Colon surgery     History reviewed. No pertinent family history. Social History  Substance Use Topics  . Smoking status: Former Research scientist (life sciences)  . Smokeless tobacco: None  . Alcohol Use: No    Review of Systems  Constitutional: Negative for fever.  HENT: Negative for congestion.   Eyes: Negative for redness and visual disturbance.  Respiratory: Negative for shortness of breath.   Cardiovascular: Negative for chest pain.  Gastrointestinal: Positive for nausea, vomiting, abdominal pain and diarrhea. Negative for blood in stool.  Genitourinary: Negative for dysuria.  Musculoskeletal: Negative for back pain.  Skin: Negative for rash.  Neurological: Negative for headaches.  Hematological: Does not bruise/bleed easily.  Psychiatric/Behavioral: Negative for confusion.      Allergies  Review of patient's allergies indicates no known allergies.  Home Medications   Prior to Admission medications   Medication Sig Start Date End Date Taking? Authorizing Provider  fludrocortisone (FLORINEF) 0.1 MG tablet Take 0.1 mg by mouth daily. 04/23/15  Yes Historical Provider, MD  hydrocortisone (CORTEF) 20 MG tablet Take 20 mg by mouth daily. 03/28/15  Yes Historical Provider, MD   levothyroxine (SYNTHROID, LEVOTHROID) 88 MCG tablet Take 88 mcg by mouth daily. 05/08/15  Yes Historical Provider, MD   BP 146/98 mmHg  Pulse 99  Temp(Src) 98.1 F (36.7 C) (Oral)  Resp 18  Ht 5\' 8"  (1.727 m)  Wt 151 lb (68.493 kg)  BMI 22.96 kg/m2  SpO2 95% Physical Exam  Constitutional: He appears well-developed and well-nourished. No distress.  HENT:  Head: Normocephalic and atraumatic.  Mouth/Throat: Oropharynx is clear and moist.  Eyes: Conjunctivae and EOM are normal. Pupils are equal, round, and reactive to light.  Neck: Normal range of motion.  Cardiovascular: Normal rate, regular rhythm and normal heart sounds.   Pulmonary/Chest: Effort normal and breath sounds normal. No respiratory distress.  Abdominal: Soft. Bowel sounds are normal. He exhibits no distension. There is no tenderness.  Musculoskeletal: Normal range of motion. He exhibits no edema.  Neurological: He is alert. No cranial nerve deficit. He exhibits normal muscle tone. Coordination normal.  Skin: Skin is warm. No rash noted.  Nursing note and vitals reviewed.   ED Course  Procedures (including critical care time) Labs Review Labs Reviewed  COMPREHENSIVE METABOLIC PANEL - Abnormal; Notable for the following:    Glucose, Bld 111 (*)    Calcium 8.8 (*)    ALT 13 (*)    All other components within normal limits  CBC WITH DIFFERENTIAL/PLATELET - Abnormal; Notable for the following:    Hemoglobin 11.9 (*)    MCH 24.1 (*)    Platelets 92 (*)    Lymphs Abs 0.3 (*)    All other components within normal limits  LIPASE, BLOOD   Results  for orders placed or performed during the hospital encounter of 06/20/15  Comprehensive metabolic panel  Result Value Ref Range   Sodium 141 135 - 145 mmol/L   Potassium 3.7 3.5 - 5.1 mmol/L   Chloride 103 101 - 111 mmol/L   CO2 32 22 - 32 mmol/L   Glucose, Bld 111 (H) 65 - 99 mg/dL   BUN 13 6 - 20 mg/dL   Creatinine, Ser 0.95 0.61 - 1.24 mg/dL   Calcium 8.8 (L) 8.9 -  10.3 mg/dL   Total Protein 6.6 6.5 - 8.1 g/dL   Albumin 3.7 3.5 - 5.0 g/dL   AST 20 15 - 41 U/L   ALT 13 (L) 17 - 63 U/L   Alkaline Phosphatase 69 38 - 126 U/L   Total Bilirubin 1.0 0.3 - 1.2 mg/dL   GFR calc non Af Amer >60 >60 mL/min   GFR calc Af Amer >60 >60 mL/min   Anion gap 6 5 - 15  Lipase, blood  Result Value Ref Range   Lipase 17 11 - 51 U/L  CBC with Differential/Platelet  Result Value Ref Range   WBC 5.9 4.0 - 10.5 K/uL   RBC 4.94 4.22 - 5.81 MIL/uL   Hemoglobin 11.9 (L) 13.0 - 17.0 g/dL   HCT 39.3 39.0 - 52.0 %   MCV 79.6 78.0 - 100.0 fL   MCH 24.1 (L) 26.0 - 34.0 pg   MCHC 30.3 30.0 - 36.0 g/dL   RDW 14.3 11.5 - 15.5 %   Platelets 92 (L) 150 - 400 K/uL   Neutrophils Relative % 88 %   Neutro Abs 5.2 1.7 - 7.7 K/uL   Lymphocytes Relative 5 %   Lymphs Abs 0.3 (L) 0.7 - 4.0 K/uL   Monocytes Relative 7 %   Monocytes Absolute 0.4 0.1 - 1.0 K/uL   Eosinophils Relative 0 %   Eosinophils Absolute 0.0 0.0 - 0.7 K/uL   Basophils Relative 0 %   Basophils Absolute 0.0 0.0 - 0.1 K/uL     Imaging Review Dg Chest 2 View  06/20/2015  CLINICAL DATA:  Shortness of breath. EXAM: CHEST  2 VIEW COMPARISON:  None FINDINGS: The heart size appears enlarged. There is a scoliosis deformity. No pleural effusion or edema. No airspace consolidation. IMPRESSION: 1. Cardiac enlargement. 2. Scoliosis Electronically Signed   By: Kerby Moors M.D.   On: 06/20/2015 09:55   Ct Abdomen Pelvis W Contrast  06/20/2015  CLINICAL DATA:  Vomiting since last night EXAM: CT ABDOMEN AND PELVIS WITH CONTRAST TECHNIQUE: Multidetector CT imaging of the abdomen and pelvis was performed using the standard protocol following bolus administration of intravenous contrast. CONTRAST:  163mL OMNIPAQUE IOHEXOL 300 MG/ML  SOLN COMPARISON:  None. FINDINGS: Lower chest: No pleural fluid identified. The lung bases are clear. Hepatobiliary: Unusual appearance of the liver which is almost entirely within the right hemi  abdomen. A small lateral segment of left lobe is noted with relative hypertrophy of the medial segment and right hepatic lobe. The gallbladder appears normal. No biliary dilatation identified. Pancreas: Normal appearance of the pancreas. Spleen: The spleen is unremarkable. Adrenals/Urinary Tract: The adrenal glands are normal. Normal appearance of the left kidney. Intermediate attenuating structure arising from the upper pole of the right kidney measures 1.5 cm and 36 HU there appears to be a septation within this structure, image number 70 of series 204. The urinary bladder appears normal. Stomach/Bowel: The stomach is normal. The small bowel loops have a normal caliber. The  duodenum does not appear to cross the midline and there may be a malrotation deformity. No evidence for midgut volvulus. The cecum appears to be in the normal location within the right lower quadrant. The appendix is visualized and appears normal. Vascular/Lymphatic: No abnormal bowel dilatation. Reproductive: Prostate gland enlargement identified. Other: No free fluid or fluid collections within the abdomen or pelvis. Musculoskeletal: The visualized bony structures are unremarkable. No aggressive lytic or sclerotic bone lesions. IMPRESSION: 1. No acute findings identified within the abdomen or pelvis. No specific findings identified to suggest bowel obstruction. 2. 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 No evidence for midgut volvulus. 3. The appendix is visualized and appears normal. 4. Indeterminate lesion arising from upper pole of right kidney. Suspect complex cyst. Suggest further assessment with followup, nonemergent MR of the kidneys. Electronically Signed   By: Kerby Moors M.D.   On: 06/20/2015 10:39   I have personally reviewed and evaluated these images and lab results as part of my medical decision-making.   EKG Interpretation None      MDM   Final diagnoses:  Gastroenteritis   Patient in no acute distress here.  Patient did have an episode where her oxygen levels got low patient was placed on 2 L of oxygen. Checked off of oxygen sats never went down below 94%. Labs without significant abnormalities. CT scan of the abdomen without any acute abdominal process. Patient on the fact the patient had nausea vomiting and diarrhea this may have been a viral gastroenteritis.  Off of oxygen patient's in no acute distress sats went as low as 91%. But remained above 90% consistently. Patient's chest x-ray was negative no evidence of pneumonia pulmonary edema or pneumothorax.  Patient will be rechecked off of oxygen to make sure sets of remain up and if he tolerates that he is stable for discharge home.      Fredia Sorrow, MD 06/20/15 [place], MD 06/20/15 [phone]

## 2015-06-20 NOTE — ED Notes (Signed)
Placed back on room air at 1150; sats 94-95% at 1215.

## 2015-06-22 ENCOUNTER — Encounter: Payer: Self-pay | Admitting: Internal Medicine

## 2015-07-07 NOTE — Progress Notes (Signed)
BMP: Na 145, K 4.3, Cr 1.13, eGFR 84, Glucose 83  TSH 2.37  Hep C Ab: Negative  Continue current management.

## 2015-08-20 ENCOUNTER — Ambulatory Visit: Payer: Commercial Managed Care - HMO | Admitting: Radiation Oncology

## 2015-08-28 ENCOUNTER — Encounter: Payer: Self-pay | Admitting: Internal Medicine

## 2015-08-28 ENCOUNTER — Ambulatory Visit (INDEPENDENT_AMBULATORY_CARE_PROVIDER_SITE_OTHER): Payer: Commercial Managed Care - HMO | Admitting: Internal Medicine

## 2015-08-28 VITALS — BP 148/100 | HR 87 | Temp 98.1°F | Wt 153.2 lb

## 2015-08-28 DIAGNOSIS — E038 Other specified hypothyroidism: Secondary | ICD-10-CM | POA: Diagnosis not present

## 2015-08-28 DIAGNOSIS — H6123 Impacted cerumen, bilateral: Secondary | ICD-10-CM | POA: Diagnosis not present

## 2015-08-28 DIAGNOSIS — Z8521 Personal history of malignant neoplasm of larynx: Secondary | ICD-10-CM

## 2015-08-28 DIAGNOSIS — F840 Autistic disorder: Secondary | ICD-10-CM

## 2015-08-28 DIAGNOSIS — E2740 Unspecified adrenocortical insufficiency: Secondary | ICD-10-CM

## 2015-08-28 DIAGNOSIS — E039 Hypothyroidism, unspecified: Secondary | ICD-10-CM

## 2015-08-28 MED ORDER — FLUDROCORTISONE ACETATE 0.1 MG PO TABS: 0.0500 mg | ORAL_TABLET | Freq: Every day | ORAL | Status: DC

## 2015-08-28 NOTE — Patient Instructions (Addendum)
DECREASE FLUDROCORTISONE TO 0.05 MG DAILY.  FOLLOW UP IN 2 WEEKS FOR BLOOD PRESSURE RECHECK.

## 2015-08-28 NOTE — Progress Notes (Signed)
Medicine attending: Medical history, presenting problems, physical findings, and medications, reviewed with resident physician Dr Alexa Richardson on the day of the patient visit and I concur with her evaluation and management plan. 

## 2015-08-28 NOTE — Assessment & Plan Note (Signed)
Patient is on Synthroid 88 mcg daily for hypothyroidism. TSH was normal in November 2016.   Plan: -Continue Synthroid 88 mcg daily

## 2015-08-28 NOTE — Assessment & Plan Note (Signed)
Blood pressure today is transiently elevated: 154/98 >160/100 on repeat> 148/100 on third trial. Given his transient hypertension in the setting of adrenal insufficiency, I will decrease his fludrocortisone dosage and have him follow up in 2 weeks for a blood pressure recheck.  Plan: -Fludrocortisone 0.05 mg once daily -Follow up in 2 weeks

## 2015-08-28 NOTE — Assessment & Plan Note (Signed)
Patient's mother complains that for the last 6 months patient has been less conversational, ignoring her, forgetting small details and watching tv instead of reading his Bible or talking with her. Of significance, patient's older brother recently moved back in and mother complains that all he does is watch tv. Patient will watch tv with his brother. Patient's mother also recently took him out of school one month ago as she felt that he needed to rest. Upon further discussion, patient's mother also wanted him at home. Patient was silent during our initial conversation, but when talking one on one with him, he states that his mom annoys him and hurries him when he is moving too slowly or talking too slowly which is frustrating. He also wants to go back to school. The issue at hand seems psychosocial. His MMSE performed in the clinic today is normal 29/30. I do not feel his has no cognitive impairment. However, he does have a slower response time. On exam, patient has significant cerumen impaction which could be causing a small deficit in hearing leading to him "ignoring" his mother. Overall, he can hear me fine in our encounter, so patient can come back to have his cerumen removed if he would like to.   Plan: -Continue to monitor

## 2015-08-28 NOTE — Progress Notes (Signed)
Subjective:    Patient ID: Darren Mcbride, male    DOB: 06/23/1961, 55 y.o.   MRN: [id number]  HPI Darren Mcbride is a 55 y.o. male with PMHx of adrenal insufficiency, GERD, hypothyroidism who presents to the clinic for dysphagia and cognitive delay. Please see A&P for the status of the patient's chronic medical problems.   Past Medical History  Diagnosis Date  . Autism spectrum disorder 09/22/2011  . 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 Always had stable electrolytes.    . Hypothyroidism 06/30/2006  . Allergic rhinitis 02/06/2013  . Gastroesophageal reflux disease 05/24/2013  . 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 Varicose veins of lower extremities  02/06/2013  . Bilateral cataracts     s/p bilateral extraction  . Onychomycosis of toenail 08/30/2013  . 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 History of radiation therapy 10/28/13-12/04/13    vocal cord,63Gy/13fx  . Bilateral high frequency sensorineural hearing loss 10/16/2014    Mild to Moderate on Audiology examination 10/16/2014.  No hearing aides felt needed.  . Thyroid disease   . Cancer [facility])     Outpatient Encounter Prescriptions as of 08/28/2015  Medication Sig  . fludrocortisone (FLORINEF) 0.1 MG tablet Take 0.5 tablets (0.05 mg total) by mouth daily.  . hydrocortisone (CORTEF) 20 MG tablet Take 1 tablet (20 mg total) by mouth daily.  . hydrocortisone (CORTEF) 20 MG tablet Take 20 mg by mouth daily.  Marland Kitchen levothyroxine (SYNTHROID, LEVOTHROID) 88 MCG tablet Take 1 tablet (88 mcg total) by mouth daily.  Marland Kitchen levothyroxine (SYNTHROID, LEVOTHROID) 88 MCG tablet Take 88 mcg by mouth daily.  . ondansetron (ZOFRAN ODT) 4 MG disintegrating tablet Take 1 tablet (4 mg total) by mouth every 8 (eight) hours as needed for nausea or vomiting.  .  [DISCONTINUED] fludrocortisone (FLORINEF) 0.1 MG tablet Take 1 tablet (0.1 mg total) by mouth daily.  . [DISCONTINUED] fludrocortisone (FLORINEF) 0.1 MG tablet Take 0.1 mg by mouth daily.   No facility-administered encounter medications on file as of 08/28/2015.    Family History  Problem Relation Age of Onset  . Diabetes Mother   . Hypertension Mother   . Cataracts Mother   . Breast cancer Mother 60    1985, surgery, chemo and XRT  . Prostate cancer Paternal Uncle   . Schizophrenia Sister   . Diabetes insipidus Sister   . Alcoholism Father   . Alcoholism Brother   . Alcoholism Brother   . Alcoholism Brother   . Congestive Heart Failure Brother   . Colon cancer Neg Hx   . Esophageal cancer Neg Hx   . Rectal cancer Neg Hx   . Stomach cancer Neg Hx     Social History   Social History  . Marital Status: Single    Spouse Name: N/A  . Number of Children: 0  . Years of Education: 9th   Occupational History  . student    Social History Main Topics  . Smoking status: Former Research scientist (life sciences)  . Smokeless tobacco: Not on file  . Alcohol Use: No  . Drug Use: No  . Sexual Activity: Not on file   Other Topics Concern  . Not on file   Social History Narrative   ** Merged History Encounter **       Patient  lives with his mother at home.   Patient is left-handed.   Patient drinks 2 cups of soda daily at home.   Patient is attending [employer].         Review of Systems General: Denies fatigue, change in appetite.  HEENT: Denies hoarseness, stridor.  Respiratory: Denies SOB, cough.   Cardiovascular: Denies chest pain and palpitations.  Gastrointestinal: Denies dysphagia, odynophagia, GERD, nausea, vomiting, abdominal pain, diarrhea. Neurological: Denies dizziness, headaches, weakness, lightheadedness Psychiatric/Behavioral: Denies mood changes, confusion, and agitation.     Objective:   Physical Exam Filed Vitals:   08/28/15 1319 08/28/15 1401  BP: 154/98 148/100  Pulse: 87     Temp: 98.1 F (36.7 C)   TempSrc: Oral   Weight: 153 lb 3.2 oz (69.491 kg)   SpO2: 96%    General: Vital signs reviewed.  Patient is well-developed and well-nourished, in no acute distress and cooperative with exam.  HEENT: Cerumen blocking TM bilaterally. Conjunctivae normal, no scleral icterus. Grossly normal oropharynx. Normal nasal turbinates. Neck is supple, trachea midline.  Cardiovascular: RRR, S1 normal, S2 normal. Pulmonary/Chest: Clear to auscultation bilaterally, no wheezes, rales, or rhonchi. Abdominal: Soft, non-tender, non-distended, BS +.  Extremities: No lower extremity edema bilaterally. Neurological: A&O x3, cranial nerve II-XII are grossly intact.  Skin: Warm, dry and intact. Psychiatric: Pleasant mood and affect. Speech is hesitant with delayed response time. Behavior is normal. Cognition and memory are normal.      Assessment & Plan:   Please see problem based assessment and plan.

## 2015-08-28 NOTE — Assessment & Plan Note (Signed)
Patient has a history of primary laryngeal cancer. Patient and mother deny any dysphagia, odynophagia, hoarseness, or shortness of breath. He denies any weight loss, night sweats. Patient is to follow up with Rad/Onc on 09/02/15. Previous follow up has been negative for recurrence of cancer.   Plan: -Follow up with Rad/Onc 09/02/15

## 2015-09-02 ENCOUNTER — Ambulatory Visit
Admission: RE | Admit: 2015-09-02 | Discharge: 2015-09-02 | Disposition: A | Discharge status: Home / Self Care | Payer: Commercial Managed Care - HMO | Source: Ambulatory Visit | Attending: Radiation Oncology | Admitting: Radiation Oncology

## 2015-09-02 ENCOUNTER — Encounter: Payer: Self-pay | Admitting: Radiation Oncology

## 2015-09-02 VITALS — BP 151/92 | HR 100 | Temp 98.2°F | Resp 16 | Ht 68.0 in | Wt 150.1 lb

## 2015-09-02 DIAGNOSIS — E2740 Unspecified adrenocortical insufficiency: Secondary | ICD-10-CM | POA: Insufficient documentation

## 2015-09-02 DIAGNOSIS — F840 Autistic disorder: Secondary | ICD-10-CM | POA: Insufficient documentation

## 2015-09-02 DIAGNOSIS — E039 Hypothyroidism, unspecified: Secondary | ICD-10-CM | POA: Insufficient documentation

## 2015-09-02 DIAGNOSIS — Z8521 Personal history of malignant neoplasm of larynx: Secondary | ICD-10-CM | POA: Diagnosis not present

## 2015-09-02 DIAGNOSIS — C320 Malignant neoplasm of glottis: Secondary | ICD-10-CM | POA: Diagnosis not present

## 2015-09-02 MED ORDER — LARYNGOSCOPY SOLUTION RAD-ONC
15.0000 mL | Freq: Once | TOPICAL | Status: AC
  Administered 2015-09-02: 15 mL via TOPICAL
  Filled 2015-09-02: qty 15

## 2015-09-02 NOTE — Progress Notes (Signed)
Follow up s/p rad tx vocal cord, 10/28/13-12/04/13,  Patient denies pain, or nausea, states swallowing difficuties at times, food still gets stuck, and it goes down after a while,  Drinks plenty fluids, fruit punch, sodas, water,  Saw Dr. Roxine Caddy Richardons 08/28/15  9:42 AM BP 151/92 mmHg  Pulse 100  Temp(Src) 98.2 F (36.8 C) (Oral)  Resp 16  Ht 5\' 8"  (1.727 m)  Wt 150 lb 1.6 oz (68.085 kg)  BMI 22.83 kg/m2  SpO2 95%  Wt Readings from Last 3 Encounters:  09/02/15 150 lb 1.6 oz (68.085 kg)  08/28/15 153 lb 3.2 oz (69.491 kg)  06/20/15 151 lb (68.493 kg)

## 2015-09-02 NOTE — Progress Notes (Signed)
  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 Drinks plenty fluids, fruit punch, sodas, water, Saw Dr. Roxine Caddy Richardons 08/28/15.  ALLERGIES:  has No Known Allergies.  Meds: Current Outpatient Prescriptions  Medication Sig Dispense Refill  . fludrocortisone (FLORINEF) 0.1 MG tablet Take 0.5 tablets (0.05 mg total) by mouth daily. 45 tablet 3  . hydrocortisone (CORTEF) 20 MG tablet Take 1 tablet (20 mg total) by mouth daily. 90 tablet 3  . levothyroxine (SYNTHROID, LEVOTHROID) 88 MCG tablet Take 1 tablet (88 mcg total) by mouth daily. 90 tablet 3  . ondansetron (ZOFRAN ODT) 4 MG disintegrating tablet Take 1 tablet (4 mg total) by mouth every 8 (eight) hours as needed for nausea or vomiting. (Patient not taking: Reported on 09/02/2015) 10 tablet 1   No current facility-administered medications for this encounter.   Physical Findings: The patient is in no acute distress. Patient is alert and oriented.  height is 5\' 8"  (1.727 m) and weight is 150 lb 1.6 oz (68.085 kg). His oral temperature is 98.2 F (36.8 C). His blood pressure is 151/92 and his pulse is 100. His respiration is 16 and oxygen saturation is 95%. .   Neck has healed well. Radiation changes remain. No lymphadenopathy. Oral cavity clear  Fiberoptic exam: After the use of topical anesthetic, the flexible laryngoscope was passed through the right nose. Good visualization was obtained of the vocal  cord region. No lesions or suspicious findings within the larynx, hypopharynx, oropharynx or nasopharynx.  Lab Findings: Lab Results  Component Value Date   WBC 5.9 06/20/2015   HGB 11.9* 06/20/2015   HCT 39.3 06/20/2015   MCV 79.6 06/20/2015   PLT 92* 06/20/2015   Radiographic Findings: No results found.  Impression: The patient is doing well. He remains clinically NED.  Plan:  The patient will return to clinic in 6 months for ongoing followup.   I spent 15 minutes with the patient today, the majority of which was spent counseling the patient on the diagnosis of cancer and coordinating care.   ------------------------------------------------  Jodelle Gross, MD, PhD  This document serves as a record of services personally performed by Kyung Rudd, MD. It was created on his behalf by Derek Mound, a trained medical scribe. The creation of this record is based on the scribe's personal observations and the provider's statements to them. This document has been checked and approved by the attending provider.

## 2015-09-03 ENCOUNTER — Encounter: Payer: Self-pay | Admitting: Radiation Oncology

## 2015-09-03 DIAGNOSIS — C320 Malignant neoplasm of glottis: Secondary | ICD-10-CM | POA: Insufficient documentation

## 2015-09-07 ENCOUNTER — Other Ambulatory Visit: Payer: Self-pay

## 2015-09-07 NOTE — Addendum Note (Signed)
Encounter addended by: Doreen Beam, RN on: 09/07/2015  3:04 PM<BR>     Documentation filed: Charges VN

## 2015-09-11 ENCOUNTER — Ambulatory Visit: Payer: Self-pay | Admitting: Internal Medicine

## 2015-09-12 ENCOUNTER — Emergency Department (HOSPITAL_COMMUNITY)
Admission: EM | Admit: 2015-09-12 | Discharge: 2015-09-12 | Disposition: A | Discharge status: Home / Self Care | Payer: Commercial Managed Care - HMO | Attending: Emergency Medicine | Admitting: Emergency Medicine

## 2015-09-12 ENCOUNTER — Encounter (HOSPITAL_COMMUNITY): Payer: Self-pay | Admitting: *Deleted

## 2015-09-12 DIAGNOSIS — Z7952 Long term (current) use of systemic steroids: Secondary | ICD-10-CM | POA: Insufficient documentation

## 2015-09-12 DIAGNOSIS — Z85828 Personal history of other malignant neoplasm of skin: Secondary | ICD-10-CM | POA: Diagnosis not present

## 2015-09-12 DIAGNOSIS — Z8719 Personal history of other diseases of the digestive system: Secondary | ICD-10-CM | POA: Diagnosis not present

## 2015-09-12 DIAGNOSIS — Z8619 Personal history of other infectious and parasitic diseases: Secondary | ICD-10-CM | POA: Diagnosis not present

## 2015-09-12 DIAGNOSIS — F840 Autistic disorder: Secondary | ICD-10-CM | POA: Insufficient documentation

## 2015-09-12 DIAGNOSIS — Z79899 Other long term (current) drug therapy: Secondary | ICD-10-CM | POA: Diagnosis not present

## 2015-09-12 DIAGNOSIS — E039 Hypothyroidism, unspecified: Secondary | ICD-10-CM | POA: Insufficient documentation

## 2015-09-12 DIAGNOSIS — H269 Unspecified cataract: Secondary | ICD-10-CM | POA: Insufficient documentation

## 2015-09-12 DIAGNOSIS — Z87891 Personal history of nicotine dependence: Secondary | ICD-10-CM | POA: Insufficient documentation

## 2015-09-12 DIAGNOSIS — Z86018 Personal history of other benign neoplasm: Secondary | ICD-10-CM | POA: Diagnosis not present

## 2015-09-12 DIAGNOSIS — L0291 Cutaneous abscess, unspecified: Secondary | ICD-10-CM

## 2015-09-12 DIAGNOSIS — M79622 Pain in left upper arm: Secondary | ICD-10-CM | POA: Diagnosis present

## 2015-09-12 DIAGNOSIS — L02412 Cutaneous abscess of left axilla: Secondary | ICD-10-CM | POA: Insufficient documentation

## 2015-09-12 DIAGNOSIS — B9689 Other specified bacterial agents as the cause of diseases classified elsewhere: Secondary | ICD-10-CM | POA: Diagnosis not present

## 2015-09-12 DIAGNOSIS — H903 Sensorineural hearing loss, bilateral: Secondary | ICD-10-CM | POA: Diagnosis not present

## 2015-09-12 MED ORDER — SULFAMETHOXAZOLE-TRIMETHOPRIM 800-160 MG PO TABS: 1.0000 | ORAL_TABLET | Freq: Two times a day (BID) | ORAL | Status: AC

## 2015-09-12 MED ORDER — OXYCODONE-ACETAMINOPHEN 5-325 MG PO TABS: 1.0000 | ORAL_TABLET | Freq: Once | ORAL | Status: DC

## 2015-09-12 MED ORDER — BACITRACIN ZINC 500 UNIT/GM EX OINT
TOPICAL_OINTMENT | CUTANEOUS | Status: DC | PRN
  Administered 2015-09-12: 2 via TOPICAL
  Filled 2015-09-12: qty 1.8

## 2015-09-12 MED ORDER — LIDOCAINE-EPINEPHRINE 2 %-1:100000 IJ SOLN
INTRAMUSCULAR | Status: AC
  Administered 2015-09-12: 1 mL
  Filled 2015-09-12: qty 1

## 2015-09-12 MED ORDER — LIDOCAINE-EPINEPHRINE (PF) 2 %-1:200000 IJ SOLN: 10.0000 mL | Freq: Once | INTRAMUSCULAR | Status: DC

## 2015-09-12 NOTE — ED Notes (Signed)
Pt's mother reports abscess in his L axilla.  Reports she thinks that it's been there for a month.  States "he thinks if he didn't mention it he can go to school."  Pt has hx of austism.

## 2015-09-12 NOTE — ED Provider Notes (Signed)
CSN: [id number]     Arrival date & time 09/12/15  1039 History   First MD Initiated Contact with Patient 09/12/15 1103     Chief Complaint  Patient presents with  . Abscess     (Consider location/radiation/quality/duration/timing/severity/associated sxs/prior Treatment) The history is provided by the patient, a parent and medical records. The history is limited by a developmental delay. No language interpreter was used.    Darren Mcbride is a 55 y.o. male  with a PMH of Autism and other chronic medical problems who presents to the Emergency Department complaining of achy left axilla pain 2/2 abscess. Per mother at bedside, the area has been worsening over the last month, now with white drainage coming from center of area. No treatments for this PTA. Denies fever, chills. No ABX in the last six months.   Past Medical History  Diagnosis Date  . Autism spectrum disorder 09/22/2011  . 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 Always had stable electrolytes.    . Hypothyroidism 06/30/2006  . Allergic rhinitis 02/06/2013  . Gastroesophageal reflux disease 05/24/2013  . 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 Varicose veins of lower extremities  02/06/2013  . Bilateral cataracts     s/p bilateral extraction  . Onychomycosis of toenail 08/30/2013  . 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 History of radiation therapy 10/28/13-12/04/13    vocal cord,63Gy/38fx  . Bilateral high frequency sensorineural hearing loss 10/16/2014    Mild to Moderate on Audiology examination 10/16/2014.  No hearing aides felt needed.  . Thyroid disease   . Cancer [facility])    Past Surgical History  Procedure Laterality Date  . Hernia repair      @ birth  . Eye surgery      Bilateral cataract extraction  . Colon polyps   3-18=14    removal of colon growths"colon polyps"  . Colonoscopy with propofol N/A 11/08/2012    Procedure: COLONOSCOPY WITH PROPOFOL;  Surgeon: Milus Banister, MD;  Location: [facility] ENDOSCOPY;  Service: Endoscopy;  Laterality: N/A;  . Colon surgery     Family History  Problem Relation Age of Onset  . Diabetes Mother   . Hypertension Mother   . Cataracts Mother   . Breast cancer Mother 70    1985, surgery, chemo and XRT  . Prostate cancer Paternal Uncle   . Schizophrenia Sister   . Diabetes insipidus Sister   . Alcoholism Father   . Alcoholism Brother   . Alcoholism Brother   . Alcoholism Brother   . Congestive Heart Failure Brother   . Colon cancer Neg Hx   . Esophageal cancer Neg Hx   . Rectal cancer Neg Hx   . Stomach cancer Neg Hx    Social History  Substance Use Topics  . Smoking status: Former Research scientist (life sciences)  . Smokeless tobacco: None  . Alcohol Use: No    Review of Systems  Constitutional: Negative for fever and chills.  Respiratory: Negative for shortness of breath.   Cardiovascular: Negative for chest pain.  Skin: Positive for wound.      Allergies  Review of patient's allergies indicates no known allergies.  Home Medications   Prior to Admission medications   Medication Sig Start Date End Date Taking? Authorizing Provider  fludrocortisone (FLORINEF) 0.1 MG tablet Take  0.5 tablets (0.05 mg total) by mouth daily. 08/28/15   Alexa Sherral Hammers, MD  hydrocortisone (CORTEF) 20 MG tablet Take 1 tablet (20 mg total) by mouth daily. 12/30/14   Oval Linsey, MD  levothyroxine (SYNTHROID, LEVOTHROID) 88 MCG tablet Take 1 tablet (88 mcg total) by mouth daily. 02/09/15   Oval Linsey, MD  ondansetron (ZOFRAN ODT) 4 MG disintegrating tablet Take 1 tablet (4 mg total) by mouth every 8 (eight) hours as needed for nausea or vomiting. Patient not taking: Reported on 09/02/2015 06/20/15   Fredia Sorrow, MD  sulfamethoxazole-trimethoprim (BACTRIM DS,SEPTRA DS) 800-160 MG tablet Take  1 tablet by mouth 2 (two) times daily. 09/12/15 09/19/15  Ozella Almond Ward, PA-C   BP 134/90 mmHg  Pulse 87  Temp(Src) 98.7 F (37.1 C) (Oral)  Resp 18  SpO2 96% Physical Exam  Constitutional: He is oriented to person, place, and time. He appears well-developed and well-nourished.  Alert and in no acute distress  HENT:  Head: Normocephalic and atraumatic.  Cardiovascular: Normal rate, regular rhythm and normal heart sounds.  Exam reveals no gallop and no friction rub.   No murmur heard. Pulmonary/Chest: Effort normal and breath sounds normal. No respiratory distress. He has no wheezes. He has no rales.  Abdominal: Soft. He exhibits no distension and no mass. There is no tenderness. There is no rebound and no guarding.  Musculoskeletal: He exhibits no edema.  Neurological: He is alert and oriented to person, place, and time.  Skin: Skin is warm and dry. No rash noted.  2 cm abscess with mild surrounding erythema of left axilla  Psychiatric: He has a normal mood and affect. His behavior is normal. Judgment and thought content normal.  Nursing note and vitals reviewed.   ED Course  Procedures (including critical care time)  INCISION AND DRAINAGE Performed by: Ozella Almond Ward Consent: Verbal consent obtained. Risks and benefits: risks, benefits and alternatives were discussed Type: abscess Body area: left axilla Anesthesia: local infiltration Incision was made with a scalpel. Local anesthetic: lidocaine 2% with epinephrine Anesthetic total: 5 ml Complexity: complex Blunt dissection to break up loculations Drainage: purulent Drainage amount: small Packing material: 1/4 in iodoform gauze Patient tolerance: Patient tolerated the procedure well with no immediate complications.  Labs Review Labs Reviewed - No data to display  Imaging Review No results found. I have personally reviewed and evaluated these images and lab results as part of my medical decision-making.   EKG  Interpretation None      MDM   Final diagnoses:  Abscess   Patient presenting with abscess requiring incision and drainage. Small amount of surrounding erythema, so will place patient on bactrim. No crepitance to suggest necrotizing fasciitis. Incision and drainage performed per procedure note. Patient tolerated the procedure well. Wound care instructions provided including washing the wound three times daily and cover with a bandage as needed. Return to ER if concern for spread of infection, increasing pain, fevers, or other concerns.  Impression:   Abscess requiring incision and drainage  Plan:  Discharge from ED Pt was instructed on proper care and hygiene of the laceration site, including warm compress or soaks to affected site x 73min tid and loose bandage application. Advised Pt to elevate affected site above heart line should minor oozing present if possible.   Prescribed Bactrim Follow up in 2 days with PCP, urgent care, or in ER for wound recheck GenSurg referral if needed for nonhealing abscess after 1-2 weeks.  Pt was instructed on  signs and symptoms of wound infection, including pain, fever, redness, swelling, tracking, and sensory/motor deficits, and instructed for Pt to return to PCP or ER immediately should these symptoms present. Pt verbally expressed understanding and all questions we  Ozella Almond Ward, PA-C 09/12/15 [place], MD 09/13/15 [phone]

## 2015-09-12 NOTE — Discharge Instructions (Signed)
It was my pleasure taking care of you today!   Follow up with your doctor or an urgent care in order to remove your packing in 48-72 hours. You may return to the emergency department if you have a fever that persists greater than 101 or your abscess appears to become infected (growing surrounding redness and warmth).  Take your antibiotic until completion. Follow up with the clinic listed above if no improvement after one week.   Abscess An abscess (boil or furuncle) is an infected area that contains a collection of pus.  SYMPTOMS Signs and symptoms of an abscess include pain, tenderness, redness, or hardness. You may feel a moveable soft area under your skin. An abscess can occur anywhere in the body.  TREATMENT  A surgical cut (incision) may be made over your abscess to drain the pus. Gauze may be packed into the space or a drain may be looped through the abscess cavity (pocket). This provides a drain that will allow the cavity to heal from the inside outwards. The abscess may be painful for a few days, but should feel much better if it was drained.  Your abscess, if seen early, may not have localized and may not have been drained. If not, another appointment may be required if it does not get better on its own or with medications. HOME CARE INSTRUCTIONS   Only take over-the-counter or prescription medicines for pain, discomfort, or fever as directed by your caregiver.   Take your antibiotics as directed if they were prescribed. Finish them even if you start to feel better.   Keep the skin and clothes clean around your abscess.   If the abscess was drained, you will need to use gauze dressing to collect any draining pus. Dressings will typically need to be changed 3 or more times a day.   The infection may spread by skin contact with others. Avoid skin contact as much as possible.   Practice good hygiene. This includes regular hand washing, cover any draining skin lesions, and do not share  personal care items.   If you participate in sports, do not share athletic equipment, towels, whirlpools, or personal care items. Shower after every practice or tournament.   If a draining area cannot be adequately covered:   Do not participate in sports.   Children should not participate in day care until the wound has healed or drainage stops.   If your caregiver has given you a follow-up appointment, it is very important to keep that appointment. Not keeping the appointment could result in a much worse infection, chronic or permanent injury, pain, and disability. If there is any problem keeping the appointment, you must call back to this facility for assistance.  SEEK MEDICAL CARE IF:   You develop increased pain, swelling, redness, drainage, or bleeding in the wound site.   You develop signs of generalized infection including muscle aches, chills, fever, or a general ill feeling.   You have an oral temperature above 102 F (38.9 C).  MAKE SURE YOU:   Understand these instructions.   Will watch your condition.   Will get help right away if you are not doing well or get worse.  Document Released: 05/11/2005 Document Revised: 04/13/2011 Document Reviewed: 03/04/2008 [facility] Patient Information 2012 [place].

## 2015-09-18 ENCOUNTER — Encounter: Payer: Self-pay | Admitting: Internal Medicine

## 2015-09-18 ENCOUNTER — Ambulatory Visit (INDEPENDENT_AMBULATORY_CARE_PROVIDER_SITE_OTHER): Payer: Commercial Managed Care - HMO | Admitting: Internal Medicine

## 2015-09-18 VITALS — BP 114/76 | HR 80 | Temp 98.7°F | Wt 152.1 lb

## 2015-09-18 DIAGNOSIS — B9689 Other specified bacterial agents as the cause of diseases classified elsewhere: Secondary | ICD-10-CM | POA: Diagnosis not present

## 2015-09-18 DIAGNOSIS — L02412 Cutaneous abscess of left axilla: Secondary | ICD-10-CM

## 2015-09-18 NOTE — Progress Notes (Signed)
Subjective:   Patient ID: Darren Mcbride male   DOB: 12/26/1960 55 y.o.   MRN: [id number]  HPI: [name] is a 55 y.o. male w/ PMHx of Autism w/ cognitive impairment, adrenal insufficiency, hypothyroidism, GERD, and h/o throat CA (2015), presents to the clinic today for an ED follow up after having a left axillary abscess I&D. Today, he states he is doing well, denies any pain, swelling, or drainage coming from the site. No recent fever, chills, nausea, or vomiting. He is accompanied by mother today. He also admits to some tenderness/sore on his penis.   Past Medical History  Diagnosis Date  . Autism spectrum disorder 09/22/2011  . 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 Always had stable electrolytes.    . Hypothyroidism 06/30/2006  . Allergic rhinitis 02/06/2013  . Gastroesophageal reflux disease 05/24/2013  . 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 Varicose veins of lower extremities  02/06/2013  . Bilateral cataracts     s/p bilateral extraction  . Onychomycosis of toenail 08/30/2013  . 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 History of radiation therapy 10/28/13-12/04/13    vocal cord,63Gy/35fx  . Bilateral high frequency sensorineural hearing loss 10/16/2014    Mild to Moderate on Audiology examination 10/16/2014.  No hearing aides felt needed.  . Thyroid disease   . Cancer [facility])    Current Outpatient Prescriptions  Medication Sig Dispense Refill  . fludrocortisone (FLORINEF) 0.1 MG tablet Take 0.5 tablets (0.05 mg total) by mouth daily. 45 tablet 3  . hydrocortisone (CORTEF) 20 MG tablet Take 1 tablet (20 mg total) by mouth daily. 90 tablet 3  . levothyroxine (SYNTHROID, LEVOTHROID) 88 MCG tablet Take 1 tablet (88 mcg total) by mouth daily. 90 tablet 3  . ondansetron (ZOFRAN  ODT) 4 MG disintegrating tablet Take 1 tablet (4 mg total) by mouth every 8 (eight) hours as needed for nausea or vomiting. (Patient not taking: Reported on 09/02/2015) 10 tablet 1  . sulfamethoxazole-trimethoprim (BACTRIM DS,SEPTRA DS) 800-160 MG tablet Take 1 tablet by mouth 2 (two) times daily. 14 tablet 0   No current facility-administered medications for this visit.    Review of Systems: General: Denies fever, chills, diaphoresis, appetite change and fatigue.  Respiratory: Denies SOB, DOE, cough, and wheezing.   Cardiovascular: Denies chest pain and palpitations.  Gastrointestinal: Denies nausea, vomiting, abdominal pain, and diarrhea.  Genitourinary: Denies dysuria, increased frequency, and flank pain. Endocrine: Denies hot or cold intolerance, polyuria, and polydipsia. Musculoskeletal: Denies myalgias, back pain, joint swelling, arthralgias and gait problem.  Skin: Denies pallor, rash and wounds.  Neurological: Denies dizziness, seizures, syncope, weakness, lightheadedness, numbness and headaches.  Psychiatric/Behavioral: Denies mood changes, and sleep disturbances.  Objective:   Physical Exam: Filed Vitals:   09/18/15 0957  BP: 114/76  Pulse: 80  Temp: 98.7 F (37.1 C)  TempSrc: Oral  Weight: 152 lb 1.6 oz (68.992 kg)  SpO2: 97%    General: AA male, alert, cooperative, NAD. HEENT: PERRL, EOMI. Moist mucus membranes Axilla: Left axilla appears clean and dry. Healing lesion, still hardened, no tenderness, drainage, or erythema.  Neck: Full range of motion without pain, supple, no lymphadenopathy or carotid bruits Lungs: Clear to ascultation bilaterally, normal work of respiration, no wheezes, rales, rhonchi Heart: RRR, no murmurs, gallops, or rubs Abdomen:  Soft, non-tender, non-distended, BS + Genital: Uncircumcised, no obvious swelling, ulcerations, or erythema noted.  Extremities: No cyanosis, clubbing, or edema Neurologic: Cranial nerves II-XII intact, strength  grossly intact, sensation intact to light touch   Assessment & Plan:   Please see problem based assessment and plan.

## 2015-09-18 NOTE — Patient Instructions (Signed)
1. You have a follow up appointment as follows:  12/25/15 @ 10:15 AM with Dr. Eppie Gibson  2. Please take all medications as previously prescribed.  Continue full course of antibiotics.   Keep axillary region clean an dry. If starts to feel painful again, come back and see [place] in the clinic.   3. If you have worsening of your symptoms or new symptoms arise, please call the clinic [phone]), or go to the ER immediately if symptoms are severe.

## 2015-09-21 DIAGNOSIS — L02412 Cutaneous abscess of left axilla: Secondary | ICD-10-CM | POA: Insufficient documentation

## 2015-09-21 NOTE — Assessment & Plan Note (Signed)
Healing well, no further drainage, tenderness, or erythema. Still has 2 days of Bactrim left per his mother. No systemic symptoms. No axillary lymphadenopathy.

## 2015-09-24 NOTE — Progress Notes (Signed)
Medicine attending: Medical history, presenting problems, physical findings, and medications, reviewed with resident physician Dr Eden Jones on the day of the patient visit and I concur with his evaluation and management plan. 

## 2015-09-29 ENCOUNTER — Encounter (HOSPITAL_COMMUNITY): Payer: Self-pay

## 2015-09-29 ENCOUNTER — Emergency Department (HOSPITAL_COMMUNITY)
Admission: EM | Admit: 2015-09-29 | Discharge: 2015-09-29 | Disposition: A | Discharge status: Home / Self Care | Payer: Commercial Managed Care - HMO | Attending: Emergency Medicine | Admitting: Emergency Medicine

## 2015-09-29 DIAGNOSIS — F840 Autistic disorder: Secondary | ICD-10-CM | POA: Diagnosis not present

## 2015-09-29 DIAGNOSIS — R110 Nausea: Secondary | ICD-10-CM | POA: Insufficient documentation

## 2015-09-29 DIAGNOSIS — R509 Fever, unspecified: Secondary | ICD-10-CM | POA: Insufficient documentation

## 2015-09-29 LAB — CBC
HCT: 37.5 % — ABNORMAL LOW (ref 39.0–52.0)
Hemoglobin: 11.3 g/dL — ABNORMAL LOW (ref 13.0–17.0)
MCH: 24.2 pg — AB (ref 26.0–34.0)
MCHC: 30.1 g/dL (ref 30.0–36.0)
MCV: 80.3 fL (ref 78.0–100.0)
PLATELETS: 92 10*3/uL — AB (ref 150–400)
RBC: 4.67 MIL/uL (ref 4.22–5.81)
RDW: 14.5 % (ref 11.5–15.5)
WBC: 4.8 10*3/uL (ref 4.0–10.5)

## 2015-09-29 LAB — LIPASE, BLOOD: LIPASE: 24 U/L (ref 11–51)

## 2015-09-29 LAB — COMPREHENSIVE METABOLIC PANEL
ALBUMIN: 4 g/dL (ref 3.5–5.0)
ALT: 16 U/L — AB (ref 17–63)
AST: 30 U/L (ref 15–41)
Alkaline Phosphatase: 61 U/L (ref 38–126)
Anion gap: 9 (ref 5–15)
BUN: 16 mg/dL (ref 6–20)
CHLORIDE: 98 mmol/L — AB (ref 101–111)
CO2: 31 mmol/L (ref 22–32)
CREATININE: 1.13 mg/dL (ref 0.61–1.24)
Calcium: 8.5 mg/dL — ABNORMAL LOW (ref 8.9–10.3)
GFR calc Af Amer: >60 mL/min (ref >60)
GLUCOSE: 105 mg/dL — AB (ref 65–99)
Potassium: 3.8 mmol/L (ref 3.5–5.1)
Sodium: 138 mmol/L (ref 135–145)
Total Bilirubin: 0.1 mg/dL — ABNORMAL LOW (ref 0.3–1.2)
Total Protein: 7.3 g/dL (ref 6.5–8.1)

## 2015-09-29 NOTE — ED Notes (Signed)
Pt c/o nausea x 2 days ago and fever this afternoon.  Denies pain.  Pt's mother reports "he felt warm, so I gave him 2 tylenol."  Sts she never took his temperature.

## 2015-09-29 NOTE — ED Notes (Signed)
This RN notified that the Pt left.

## 2015-11-05 ENCOUNTER — Encounter: Payer: Self-pay | Admitting: Gastroenterology

## 2015-12-07 ENCOUNTER — Telehealth: Payer: Self-pay

## 2015-12-07 NOTE — Telephone Encounter (Signed)
rec'd call from patient mother requesting an appointment sooner than the upcoming scheduled OV 12/25/15.  Patients mother reporting she is concerned about patients weight, appetite, and frequency of bowel movements after eating.  She fears patient may have something wrong with his colon.  Mother requesting appointment as soon as possible.  Scheduled Darren Mcbride [id number] 4/27 with Dr. Aurelio Brash.    FYI Dr. Eppie Gibson

## 2015-12-09 ENCOUNTER — Telehealth: Payer: Self-pay | Admitting: Internal Medicine

## 2015-12-09 NOTE — Telephone Encounter (Signed)
APPT. REMINDER CALL, LMTCB °

## 2015-12-10 ENCOUNTER — Ambulatory Visit (INDEPENDENT_AMBULATORY_CARE_PROVIDER_SITE_OTHER): Payer: Commercial Managed Care - HMO | Admitting: Internal Medicine

## 2015-12-10 ENCOUNTER — Encounter: Payer: Self-pay | Admitting: Internal Medicine

## 2015-12-10 VITALS — BP 135/66 | HR 56 | Temp 98.6°F | Ht 69.0 in | Wt 152.7 lb

## 2015-12-10 DIAGNOSIS — E039 Hypothyroidism, unspecified: Secondary | ICD-10-CM | POA: Diagnosis not present

## 2015-12-10 NOTE — Progress Notes (Signed)
Subjective:   Patient ID: Darren Mcbride male   DOB: [birth year] 55 y.o.   MRN: [id number]  HPI: Darren Mcbride is a 55 y.o. with past medical history as outlined below who presents to clinic due to mother's concern for lack of weight gain despite heavy appetite. His mother states he eats a lot but does not gain any weight which worries her. She has also noticed that he has not been helping her around the house like he use to, describing that he has been "slower" than usual. He takes his own meds but his other keeps them in her room. She at first states she doesn't know what meds he is on and to ask the patient who cannot answer this. She then later attempts to state the dosage of his synthroid 62mcg although thinks it is 50-16mcg.   Please see problem list for status of the pt's chronic medical problems.  Past Medical History  Diagnosis Date  . Autism spectrum disorder 09/22/2011  . 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 Always had stable electrolytes.    . Hypothyroidism 06/30/2006  . Allergic rhinitis 02/06/2013  . Gastroesophageal reflux disease 05/24/2013  . 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 Varicose veins of lower extremities  02/06/2013  . Bilateral cataracts     s/p bilateral extraction  . Onychomycosis of toenail 08/30/2013  . 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 History of radiation therapy 10/28/13-12/04/13    vocal cord,63Gy/23fx  . Bilateral high frequency sensorineural hearing loss 10/16/2014    Mild to Moderate on Audiology examination 10/16/2014.  No hearing aides felt needed.  . Thyroid disease   . Cancer [facility])    Current Outpatient Prescriptions  Medication Sig Dispense Refill  . fludrocortisone (FLORINEF) 0.1 MG tablet Take 0.5 tablets (0.05 mg total) by mouth  daily. 45 tablet 3  . hydrocortisone (CORTEF) 20 MG tablet Take 1 tablet (20 mg total) by mouth daily. 90 tablet 3  . levothyroxine (SYNTHROID, LEVOTHROID) 88 MCG tablet Take 1 tablet (88 mcg total) by mouth daily. 90 tablet 3  . ondansetron (ZOFRAN ODT) 4 MG disintegrating tablet Take 1 tablet (4 mg total) by mouth every 8 (eight) hours as needed for nausea or vomiting. (Patient not taking: Reported on 09/02/2015) 10 tablet 1   No current facility-administered medications for this visit.   Family History  Problem Relation Age of Onset  . Diabetes Mother   . Hypertension Mother   . Cataracts Mother   . Breast cancer Mother 57    1985, surgery, chemo and XRT  . Prostate cancer Paternal Uncle   . Schizophrenia Sister   . Diabetes insipidus Sister   . Alcoholism Father   . Alcoholism Brother   . Alcoholism Brother   . Alcoholism Brother   . Congestive Heart Failure Brother   . Colon cancer Neg Hx   . Esophageal cancer Neg Hx   . Rectal cancer Neg Hx   . Stomach cancer Neg Hx    Social History   Social History  . Marital Status: Single    Spouse Name: N/A  . Number of Children: 0  . Years of Education: 9th   Occupational History  . student    Social History Main Topics  . Smoking status: Former Research scientist (life sciences)  .  Smokeless tobacco: None  . Alcohol Use: No  . Drug Use: No  . Sexual Activity: Not Asked   Other Topics Concern  . None   Social History Narrative   ** Merged History Encounter **       Patient lives with his mother at home.   Patient is left-handed.   Patient drinks 2 cups of soda daily at home.   Patient is attending [employer].         Review of Systems: Review of Systems  Unable to perform ROS: other  Pt has autism, mother states pt will lie so that nothing will keep him from attending school.  Answers no to most ROS questions.   Objective:  Physical Exam: Filed Vitals:   12/10/15 0933  BP: 135/66  Pulse: 56  Temp: 98.6 F (37 C)  TempSrc: Oral    Height: 5\' 9"  (1.753 m)  Weight: 152 lb 11.2 oz (69.264 kg)  SpO2: 100%   Physical Exam  Constitutional: He appears well-developed and well-nourished. No distress.  Cardiovascular: Normal rate, regular rhythm and normal heart sounds.  Exam reveals no gallop and no friction rub.   No murmur heard. Pulmonary/Chest: Effort normal and breath sounds normal. No respiratory distress. He has no wheezes. He has no rales. He exhibits no tenderness.  Abdominal: Soft. Bowel sounds are normal. He exhibits no distension. There is no tenderness. There is no rebound and no guarding.  Skin: Skin is warm and dry. No rash noted. He is not diaphoretic. No erythema. No pallor.    Assessment & Plan:   Please see problem based assessment and plan.

## 2015-12-11 LAB — TSH: TSH: 2.87 u[IU]/mL (ref 0.450–4.500)

## 2015-12-11 NOTE — Assessment & Plan Note (Addendum)
Patient's mother is concerned about lack of weight gain despite him eating a lot of food. Also noted to be acting slower than normal. He is on synthroid 36mcg, possible that dose is too high or low as he has mixed thyroid symptoms. His weight has remained stable, 152lbs today, 152 lbs in Feb 2017, 150lbs Jan 2017, and 157lbs March 2016.   - ordered TSH which came back WNL - has f/u w/ PCP on 5/12 - can reassess florinef dosage at next visit, was recently decreased to 0.5mg  due to elevated BP, his BP is nl today and he does not endorse any dizziness.

## 2015-12-13 NOTE — Progress Notes (Signed)
Case discussed with Dr. Hulen Luster at the time of the visit, soon after the resident saw the patient.  We reviewed the resident's history and exam and pertinent patient test results.  I agree with the assessment, diagnosis, and plan of care documented in the resident's note.

## 2015-12-16 ENCOUNTER — Other Ambulatory Visit: Payer: Self-pay | Admitting: Internal Medicine

## 2015-12-16 DIAGNOSIS — E2740 Unspecified adrenocortical insufficiency: Secondary | ICD-10-CM

## 2015-12-24 ENCOUNTER — Ambulatory Visit (AMBULATORY_SURGERY_CENTER): Payer: Self-pay

## 2015-12-24 VITALS — Ht 69.0 in | Wt 151.0 lb

## 2015-12-24 DIAGNOSIS — Z860100 Personal history of colon polyps, unspecified: Secondary | ICD-10-CM

## 2015-12-24 MED ORDER — SUPREP BOWEL PREP KIT 17.5-3.13-1.6 GM/177ML PO SOLN: 1.0000 | Freq: Once | ORAL | Status: DC

## 2015-12-24 NOTE — Progress Notes (Signed)
No allergies to eggs or soy No past problems with anesthesia No diet meds No home oxygen  No internet; declined emmi

## 2015-12-25 ENCOUNTER — Ambulatory Visit (INDEPENDENT_AMBULATORY_CARE_PROVIDER_SITE_OTHER): Payer: Commercial Managed Care - HMO | Admitting: Internal Medicine

## 2015-12-25 ENCOUNTER — Encounter: Payer: Self-pay | Admitting: Internal Medicine

## 2015-12-25 VITALS — BP 118/77 | HR 78 | Temp 98.1°F | Wt 150.5 lb

## 2015-12-25 DIAGNOSIS — B351 Tinea unguium: Secondary | ICD-10-CM

## 2015-12-25 DIAGNOSIS — N63 Unspecified lump in breast: Secondary | ICD-10-CM

## 2015-12-25 DIAGNOSIS — D126 Benign neoplasm of colon, unspecified: Secondary | ICD-10-CM | POA: Diagnosis not present

## 2015-12-25 DIAGNOSIS — I8393 Asymptomatic varicose veins of bilateral lower extremities: Secondary | ICD-10-CM

## 2015-12-25 DIAGNOSIS — F840 Autistic disorder: Secondary | ICD-10-CM

## 2015-12-25 DIAGNOSIS — E039 Hypothyroidism, unspecified: Secondary | ICD-10-CM

## 2015-12-25 DIAGNOSIS — E2740 Unspecified adrenocortical insufficiency: Secondary | ICD-10-CM

## 2015-12-25 DIAGNOSIS — N6320 Unspecified lump in the left breast, unspecified quadrant: Secondary | ICD-10-CM

## 2015-12-25 DIAGNOSIS — Z8521 Personal history of malignant neoplasm of larynx: Secondary | ICD-10-CM

## 2015-12-25 NOTE — Assessment & Plan Note (Signed)
Assessment  His voice is unchanged from baseline. He underwent reevaluation for recurrence on 09/02/2015 that included fiberoptic endoscopy to visualize the vocal cord. He had no evidence of recurrence at that time.  Plan  His mother tells me that he is due for repeat reevaluation for recurrence in July of this year. We will follow-up on the results of that evaluation when completed.

## 2015-12-25 NOTE — Progress Notes (Signed)
   Subjective:    Patient ID: Darren Mcbride, male    DOB: Aug 24, 1960, 55 y.o.   MRN: [id number]  HPI  Larenzo Cittadino is here for follow-up of his adrenal insufficiency, idiopathic hypothyroidism, and history of laryngeal cancer. Please see the A&P for the status of the pt's chronic medical problems.  Review of Systems  Constitutional: Negative for activity change, appetite change and unexpected weight change.  Respiratory: Negative for chest tightness, shortness of breath and wheezing.   Cardiovascular: Negative for chest pain, palpitations and leg swelling.       Left breast swelling noted by mother, reportedly painless.  Gastrointestinal: Negative for nausea, vomiting, abdominal pain, diarrhea and constipation.  Musculoskeletal: Negative for arthralgias.  Skin: Negative for color change, rash and wound.      Objective:   Physical Exam  Constitutional: He appears well-developed and well-nourished. No distress.  HENT:  Head: Normocephalic and atraumatic.  Eyes: Conjunctivae are normal. Right eye exhibits no discharge. Left eye exhibits no discharge. No scleral icterus.  Cardiovascular: Normal rate and regular rhythm.  Exam reveals no gallop and no friction rub.   Murmur heard. II/VI systolic murmur at the LUSB.  Pulmonary/Chest: Effort normal and breath sounds normal. No respiratory distress. He has no wheezes. He has no rales. He exhibits no tenderness.  Mild swelling of the left breast tissue medial to the nipple  Abdominal: Soft. Bowel sounds are normal. He exhibits no distension. There is no tenderness. There is no rebound and no guarding.  Musculoskeletal: Normal range of motion. He exhibits no edema or tenderness.  Varicose veins present bilaterally  Neurological: He is alert. He exhibits normal muscle tone.  Skin: Skin is warm and dry. No rash noted. He is not diaphoretic. No erythema.  Multiple moles on back.  None appear malignant.  Nursing note and vitals reviewed.       Assessment & Plan:   Please see problem oriented charting.

## 2015-12-25 NOTE — Assessment & Plan Note (Signed)
Assessment  He states he is compliant with his fludrocortisone 0.05 mg daily and hydrocortisone 20 mg by mouth daily. He is without any complaints on these medications.  Plan  We will continue the fludrocortisone a 0.05 mg by mouth daily and hydrocortisone at 20 mg by mouth daily. We will reassess for signs or symptoms of adrenal insufficiency at the follow-up visit.

## 2015-12-25 NOTE — Patient Instructions (Signed)
It was great to see you again.  You are doing well.  1) I sent a referral for a mammogram for your left breast swelling.  2) Keep taking all of your medications as you are.  I will see you back in 6 months, sooner if necessary.

## 2015-12-25 NOTE — Assessment & Plan Note (Signed)
Assessment  His bilateral lower extremity varicose veins are stable in appearance. He denies any symptoms of pain.  Plan  We will continue to follow the varicose veins expectantly and reassess for evidence of pain at the follow-up visit.

## 2015-12-25 NOTE — Assessment & Plan Note (Signed)
He is up-to-date on his health care maintenance. 

## 2015-12-25 NOTE — Assessment & Plan Note (Signed)
Assessment  He continues to have thickened nails of the bilateral large toes consistent with onychomycoses. These nails are too thick to be amenable to lacquer therapy. We discussed terbinafine and its associated hepatotoxicity. The decision was made by the mother not to take the risk of treating his toes with terbinafine as they have not been bothering him.  Plan  We will follow-up on any symptoms or changes in his bilateral great toe onychomycoses at the follow-up visit.

## 2015-12-25 NOTE — Addendum Note (Signed)
Addended by: Marcelino Duster on: 12/25/2015 04:58 PM   Modules accepted: Orders

## 2015-12-25 NOTE — Assessment & Plan Note (Signed)
Assessment  He is due for a repeat colonoscopy in one is scheduled within the next month.  Plan  We will follow-up on the results of the repeat colonoscopy in his surveillance for his previous tubulovillous adenomas.

## 2015-12-25 NOTE — Assessment & Plan Note (Signed)
Assessment  He continues to go to school on a regular basis which he finds very enjoyable.  Plan  We will continue to follow stability clinically.

## 2015-12-25 NOTE — Assessment & Plan Note (Signed)
Assessment  He is without any signs or symptoms of hypothyroidism at this time. He states he has been compliant with his Synthroid 88 g by mouth daily and is tolerating it well.  Plan  We will continue with the Synthroid 88 g by mouth daily and reassess for signs or symptoms consistent with clinically significant hypothyroidism at the follow-up visit.

## 2016-01-01 ENCOUNTER — Encounter: Payer: Self-pay | Admitting: Gastroenterology

## 2016-01-01 ENCOUNTER — Ambulatory Visit (AMBULATORY_SURGERY_CENTER): Payer: Commercial Managed Care - HMO | Admitting: Gastroenterology

## 2016-01-01 VITALS — BP 123/76 | HR 87 | Temp 98.9°F | Resp 21 | Ht 69.0 in | Wt 151.0 lb

## 2016-01-01 DIAGNOSIS — K219 Gastro-esophageal reflux disease without esophagitis: Secondary | ICD-10-CM | POA: Diagnosis not present

## 2016-01-01 DIAGNOSIS — E079 Disorder of thyroid, unspecified: Secondary | ICD-10-CM | POA: Diagnosis not present

## 2016-01-01 DIAGNOSIS — F840 Autistic disorder: Secondary | ICD-10-CM | POA: Diagnosis not present

## 2016-01-01 DIAGNOSIS — Z86010 Personal history of colonic polyps: Secondary | ICD-10-CM

## 2016-01-01 DIAGNOSIS — Z1211 Encounter for screening for malignant neoplasm of colon: Secondary | ICD-10-CM | POA: Diagnosis not present

## 2016-01-01 MED ORDER — SODIUM CHLORIDE 0.9 % IV SOLN
500.0000 mL | INTRAVENOUS | Status: DC
Start: 2016-01-01 — End: 2016-01-03

## 2016-01-01 NOTE — Progress Notes (Signed)
To pacu vss patent aw report to rn 

## 2016-01-01 NOTE — Op Note (Signed)
[facility] Patient Name: Darren Mcbride Procedure Date: 01/01/2016 2:17 PM MRN: [id number] Endoscopist: Milus Banister , MD Age: 55 Referring MD:  Date of Birth: 1960/08/16 Gender: Male Procedure:                Colonoscopy Indications:              High risk colon cancer surveillance: Personal                            history of colonic polyps (TVA partially removed                            from cecum 2012 colonoscopy Dr. Ardis Hughs; follow up 3                            months later 1cm residual TA; 2014 colonoscopy was                            normal) Medicines:                Monitored Anesthesia Care Procedure:                Pre-Anesthesia Assessment:                           - Prior to the procedure, a History and Physical                            was performed, and patient medications and                            allergies were reviewed. The patient's tolerance of                            previous anesthesia was also reviewed. The risks                            and benefits of the procedure and the sedation                            options and risks were discussed with the patient.                            All questions were answered, and informed consent                            was obtained. Prior Anticoagulants: The patient has                            taken no previous anticoagulant or antiplatelet                            agents. ASA Grade Assessment: II - A patient with  mild systemic disease. After reviewing the risks                            and benefits, the patient was deemed in                            satisfactory condition to undergo the procedure.                           After obtaining informed consent, the colonoscope                            was passed under direct vision. Throughout the                            procedure, the patient's blood pressure, pulse, and   oxygen saturations were monitored continuously. The                            Model PCF-[id number]) scope was introduced                            through the anus and advanced to the the cecum,                            identified by appendiceal orifice and ileocecal                            valve. The colonoscopy was performed without                            difficulty. The patient tolerated the procedure                            well. The quality of the bowel preparation was                            good. The ileocecal valve, appendiceal orifice, and                            rectum were photographed. Scope In: 2:28:37 PM Scope Out: 2:43:18 PM Scope Withdrawal Time: 0 hours 4 minutes 12 seconds  Total Procedure Duration: 0 hours 14 minutes 41 seconds  Findings:                 The entire examined colon appeared normal. Complications:            No immediate complications. Estimated blood loss:                            None. Estimated Blood Loss:     Estimated blood loss: none. Impression:               - The entire examined colon is normal.                           -  No specimens collected. Recommendation:           - Patient has a contact number available for                            emergencies. The signs and symptoms of potential                            delayed complications were discussed with the                            patient. Return to normal activities tomorrow.                            Written discharge instructions were provided to the                            patient.                           - Resume previous diet.                           - Continue present medications.                           - Repeat colonoscopy in 5 years for surveillance. Milus Banister, MD 01/01/2016 2:46:44 PM This report has been signed electronically.

## 2016-01-01 NOTE — Patient Instructions (Signed)
YOU HAD AN ENDOSCOPIC PROCEDURE TODAY AT [facility]:   Refer to the procedure report that was given to you for any specific questions about what was found during the examination.  If the procedure report does not answer your questions, please call your gastroenterologist to clarify.  If you requested that your care partner not be given the details of your procedure findings, then the procedure report has been included in a sealed envelope for you to review at your convenience later.  YOU SHOULD EXPECT: Some feelings of bloating in the abdomen. Passage of more gas than usual.  Walking can help get rid of the air that was put into your GI tract during the procedure and reduce the bloating. If you had a lower endoscopy (such as a colonoscopy or flexible sigmoidoscopy) you may notice spotting of blood in your stool or on the toilet paper. If you underwent a bowel prep for your procedure, you may not have a normal bowel movement for a few days.  Please Note:  You might notice some irritation and congestion in your nose or some drainage.  This is from the oxygen used during your procedure.  There is no need for concern and it should clear up in a day or so.  SYMPTOMS TO REPORT IMMEDIATELY:   Following lower endoscopy (colonoscopy or flexible sigmoidoscopy):  Excessive amounts of blood in the stool  Significant tenderness or worsening of abdominal pains  Swelling of the abdomen that is new, acute  Fever of 100F or higher   For urgent or emergent issues, a gastroenterologist can be reached at any hour by calling [phone].   DIET: Your first meal following the procedure should be a small meal and then it is ok to progress to your normal diet. Heavy or fried foods are harder to digest and may make you feel nauseous or bloated.  Likewise, meals heavy in dairy and vegetables can increase bloating.  Drink plenty of fluids but you should avoid alcoholic beverages for 24  hours.  ACTIVITY:  You should plan to take it easy for the rest of today and you should NOT DRIVE or use heavy machinery until tomorrow (because of the sedation medicines used during the test).    FOLLOW UP: Our staff will call the number listed on your records the next business day following your procedure to check on you and address any questions or concerns that you may have regarding the information given to you following your procedure. If we do not reach you, we will leave a message.  However, if you are feeling well and you are not experiencing any problems, there is no need to return our call.  We will assume that you have returned to your regular daily activities without incident.  If any biopsies were taken you will be contacted by phone or by letter within the next 1-3 weeks.  Please call us at [phone] if you have not heard about the biopsies in 3 weeks.    SIGNATURES/CONFIDENTIALITY: You and/or your care partner have signed paperwork which will be entered into your electronic medical record.  These signatures attest to the fact that that the information above on your After Visit Summary has been reviewed and is understood.  Full responsibility of the confidentiality of this discharge information lies with you and/or your care-partner.  THANK-YOU FOR CHOOSING [place] FOR YOUR HEALTHCARE NEEDS TODAY.

## 2016-01-04 ENCOUNTER — Telehealth: Payer: Self-pay | Admitting: *Deleted

## 2016-01-04 ENCOUNTER — Ambulatory Visit
Admission: RE | Admit: 2016-01-04 | Discharge: 2016-01-04 | Disposition: A | Discharge status: Home / Self Care | Payer: Commercial Managed Care - HMO | Source: Ambulatory Visit | Attending: Internal Medicine | Admitting: Internal Medicine

## 2016-01-04 DIAGNOSIS — R921 Mammographic calcification found on diagnostic imaging of breast: Secondary | ICD-10-CM | POA: Diagnosis not present

## 2016-01-04 DIAGNOSIS — N6320 Unspecified lump in the left breast, unspecified quadrant: Secondary | ICD-10-CM

## 2016-01-04 NOTE — Telephone Encounter (Signed)
  Follow up Call-  Call back number 01/01/2016  Post procedure Call Back phone  # [phone]  Permission to leave phone message Yes     Patient questions:  Do you have a fever, pain , or abdominal swelling? No. Pain Score  0 *  Have you tolerated food without any problems? Yes.    Have you been able to return to your normal activities? Yes.    Do you have any questions about your discharge instructions: Diet   No. Medications  No. Follow up visit  No.  Do you have questions or concerns about your Care? No.  Actions: * If pain score is 4 or above: No action needed, pain <4.

## 2016-01-14 ENCOUNTER — Other Ambulatory Visit: Payer: Self-pay | Admitting: General Surgery

## 2016-01-14 DIAGNOSIS — R921 Mammographic calcification found on diagnostic imaging of breast: Secondary | ICD-10-CM | POA: Diagnosis not present

## 2016-02-01 ENCOUNTER — Other Ambulatory Visit: Payer: Self-pay | Admitting: Internal Medicine

## 2016-02-01 DIAGNOSIS — E039 Hypothyroidism, unspecified: Secondary | ICD-10-CM

## 2016-02-03 ENCOUNTER — Other Ambulatory Visit: Payer: Self-pay | Admitting: General Surgery

## 2016-02-03 DIAGNOSIS — R921 Mammographic calcification found on diagnostic imaging of breast: Secondary | ICD-10-CM

## 2016-02-13 HISTORY — PX: BREAST EXCISIONAL BIOPSY: SUR124

## 2016-02-24 ENCOUNTER — Encounter (HOSPITAL_BASED_OUTPATIENT_CLINIC_OR_DEPARTMENT_OTHER): Payer: Self-pay | Admitting: *Deleted

## 2016-02-24 ENCOUNTER — Ambulatory Visit
Admission: RE | Admit: 2016-02-24 | Discharge: 2016-02-24 | Disposition: A | Discharge status: Home / Self Care | Payer: Commercial Managed Care - HMO | Source: Ambulatory Visit | Attending: Radiation Oncology | Admitting: Radiation Oncology

## 2016-02-24 ENCOUNTER — Encounter: Payer: Self-pay | Admitting: Radiation Oncology

## 2016-02-24 VITALS — BP 141/71 | HR 48 | Temp 97.9°F | Resp 16 | Ht 69.0 in | Wt 148.5 lb

## 2016-02-24 DIAGNOSIS — J309 Allergic rhinitis, unspecified: Secondary | ICD-10-CM | POA: Diagnosis not present

## 2016-02-24 DIAGNOSIS — Z923 Personal history of irradiation: Secondary | ICD-10-CM | POA: Insufficient documentation

## 2016-02-24 DIAGNOSIS — E2740 Unspecified adrenocortical insufficiency: Secondary | ICD-10-CM | POA: Insufficient documentation

## 2016-02-24 DIAGNOSIS — C320 Malignant neoplasm of glottis: Secondary | ICD-10-CM | POA: Insufficient documentation

## 2016-02-24 DIAGNOSIS — Z8521 Personal history of malignant neoplasm of larynx: Secondary | ICD-10-CM

## 2016-02-24 DIAGNOSIS — F840 Autistic disorder: Secondary | ICD-10-CM | POA: Diagnosis not present

## 2016-02-24 DIAGNOSIS — E039 Hypothyroidism, unspecified: Secondary | ICD-10-CM | POA: Diagnosis not present

## 2016-02-24 DIAGNOSIS — K219 Gastro-esophageal reflux disease without esophagitis: Secondary | ICD-10-CM | POA: Diagnosis not present

## 2016-02-24 DIAGNOSIS — Z9889 Other specified postprocedural states: Secondary | ICD-10-CM | POA: Diagnosis not present

## 2016-02-24 DIAGNOSIS — Z79899 Other long term (current) drug therapy: Secondary | ICD-10-CM | POA: Insufficient documentation

## 2016-02-24 MED ORDER — LARYNGOSCOPY SOLUTION RAD-ONC
15.0000 mL | Freq: Once | TOPICAL | Status: AC
  Administered 2016-02-24: 15 mL via TOPICAL
  Filled 2016-02-24: qty 15

## 2016-02-24 NOTE — Progress Notes (Signed)
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 He has been NED in the last 2 years and comes today for follow-up.  On review of systems, the patient reports that he is doing well overall. He denies any chest pain, shortness of breath, cough, fevers, chills, night sweats, unintended weight changes. He denies any bowel or bladder disturbances, and denies abdominal pain, nausea or vomiting. He denies any new musculoskeletal or joint aches or pains. A complete review of systems is obtained and is otherwise negative.     Past Medical History:  Past Medical History  Diagnosis Date  . Autism spectrum disorder 09/22/2011  . 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 Always had stable electrolytes.    . Hypothyroidism 06/30/2006  . Allergic rhinitis 02/06/2013  . Gastroesophageal reflux disease 05/24/2013  . 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 Varicose veins of lower extremities  02/06/2013  . Bilateral cataracts     s/p bilateral extraction  . Onychomycosis of toenail 08/30/2013  . Cancer ([place]) 09/13/13 bx    vocal cord =invasive squamous cell ca  . History of radiation therapy 10/28/13-12/04/13    vocal cord,63Gy/58fx  . Bilateral high frequency sensorineural hearing loss 10/16/2014    Mild to Moderate  on Audiology examination 10/16/2014.  No hearing aides felt needed.  . Thyroid disease   . Cancer [facility])     Past Surgical History: Past Surgical History  Procedure Laterality Date  . Hernia repair      @ birth  . Eye surgery      Bilateral cataract extraction  . Colon polyps  3-18=14    removal of colon growths"colon polyps"  . Colonoscopy with propofol N/A 11/08/2012    Procedure: COLONOSCOPY WITH PROPOFOL;  Surgeon: Milus Banister, MD;  Location: [facility];  Service: Endoscopy;  Laterality: N/A;  . Colon surgery      Social History:  Social History   Social History  . Marital Status: Single    Spouse Name: N/A  . Number of Children: 0  . Years of Education: 9th   Occupational History  . student    Social History Main Topics  . Smoking status: Never Smoker   . Smokeless tobacco: Never Used  . Alcohol Use: No  . Drug Use: No  . Sexual Activity: Not on file   Other Topics Concern  . Not on file   Social History Narrative   ** Merged History Encounter **       Patient lives with his mother at home.   Patient is left-handed.   Patient drinks 2 cups of soda daily at home.   Patient is attending [employer].          Family History: Family History  Problem Relation Age of Onset  .  Diabetes Mother   . Hypertension Mother   . Cataracts Mother   . Breast cancer Mother 91    1985, surgery, chemo and XRT  . Prostate cancer Paternal Uncle   . Schizophrenia Sister   . Diabetes insipidus Sister   . Breast cancer Sister   . Alcoholism Father   . Alcoholism Brother   . Alcoholism Brother   . Alcoholism Brother   . Congestive Heart Failure Brother   . Colon cancer Neg Hx   . Esophageal cancer Neg Hx   . Rectal cancer Neg Hx   . Stomach cancer Neg Hx      ALLERGIES:  has No Known Allergies.  Meds: Current Outpatient Prescriptions  Medication Sig Dispense Refill  . fludrocortisone (FLORINEF) 0.1 MG tablet Take 0.5 tablets (0.05 mg total) by mouth daily. 45  tablet 3  . hydrocortisone (CORTEF) 20 MG tablet Take 1 tablet (20 mg total) by mouth daily. 90 tablet 3  . levothyroxine (SYNTHROID, LEVOTHROID) 88 MCG tablet Take 1 tablet (88 mcg total) by mouth daily. 90 tablet 3   No current facility-administered medications for this encounter.   Physical Findings:  height is 5\' 9"  (1.753 m) and weight is 148 lb 8 oz (67.359 kg). His oral temperature is 97.9 F (36.6 C). His blood pressure is 141/71 and his pulse is 48. His respiration is 16 and oxygen saturation is 97%. .   In general this is a well appearing He in no acute distress. He is alert and oriented x4 and appropriate throughout the examination. HEENT reveals that the patient is normocephalic, atraumatic. EOMs are intact. PERRLA. Skin is intact without any evidence of gross lesions. Cardiovascular exam reveals a regular rate and rhythm, no clicks rubs or murmurs are auscultated. Chest is clear to auscultation bilaterally. Lymphatic assessment is performed and does not reveal any adenopathy in the cervical, supraclavicular, axillary, or inguinal chains. Abdomen has active bowel sounds in all quadrants and is intact. The abdomen is soft, non tender, non distended. Lower extremities are negative for pretibial pitting edema, deep calf tenderness, cyanosis or clubbing.  The patient reports that his swallowing has been doing well, but his mother reports that every so often he has trouble swallowing and has lost 2-3 pounds. The patient denies pain in the neck but states that the food does get stuck every so often. No swollen lymph nodes or masses were felt on the neck from palpation.      Of note: the patient is autistic and his mother was the one mainly speaking with Dr. Lisbeth Renshaw.   Fiberoptic exam: After the use of topical anesthetic, the flexible laryngoscope was passed through the right nare. Complete visualization was obtained of the vocal cord region. No lesions or suspicious findings within the larynx,  hypopharynx, oropharynx or nasopharynx.  Lab Findings: Lab Results  Component Value Date   WBC 4.8 09/29/2015   HGB 11.3* 09/29/2015   HCT 37.5* 09/29/2015   MCV 80.3 09/29/2015   PLT 92* 09/29/2015   Radiographic Findings: No results found.  Impression/Plan:  The patient has no evidence of disease on exam today. He continues to do well clinically. The patient will return in another 6 months for routine follow up.           This document serves as a record of services personally performed by Kyung Rudd, MD. It was created on his behalf by Truddie Hidden, a trained medical scribe. The creation of this record is based on the  scribe's personal observations and the provider's statements to them. This document has been checked and approved by the attending provider.

## 2016-02-24 NOTE — Progress Notes (Addendum)
Follow up s/p rad tx vocal cord 10/28/13-12/04/13, last TSH 12/10/15=2.870,   no c/o pain, appetite good, patient answers  Yes and no, no fatigue Colonoscopy 01/01/16, mother stated knot in left breast bx scheduled 03/09/16  With Dr. Marlou Starks, MD III 10:00 AM BP 141/71 mmHg  Pulse 48  Temp(Src) 97.9 F (36.6 C) (Oral)  Resp 16  Ht 5\' 9"  (1.753 m)  Wt 148 lb 8 oz (67.359 kg)  BMI 21.92 kg/m2  SpO2 97%  Wt Readings from Last 3 Encounters:  02/24/16 148 lb 8 oz (67.359 kg)  01/01/16 151 lb (68.493 kg)  12/25/15 150 lb 8 oz (68.266 kg)

## 2016-02-25 ENCOUNTER — Encounter: Payer: Self-pay | Admitting: Radiation Oncology

## 2016-03-07 ENCOUNTER — Ambulatory Visit
Admission: RE | Admit: 2016-03-07 | Discharge: 2016-03-07 | Disposition: A | Discharge status: Home / Self Care | Payer: Commercial Managed Care - HMO | Source: Ambulatory Visit | Attending: General Surgery | Admitting: General Surgery

## 2016-03-07 DIAGNOSIS — R921 Mammographic calcification found on diagnostic imaging of breast: Secondary | ICD-10-CM

## 2016-03-09 ENCOUNTER — Encounter (HOSPITAL_BASED_OUTPATIENT_CLINIC_OR_DEPARTMENT_OTHER)
Admission: RE | Disposition: A | Discharge status: Home / Self Care | Payer: Self-pay | Source: Ambulatory Visit | Attending: General Surgery

## 2016-03-09 ENCOUNTER — Ambulatory Visit (HOSPITAL_BASED_OUTPATIENT_CLINIC_OR_DEPARTMENT_OTHER): Payer: Commercial Managed Care - HMO | Admitting: Certified Registered"

## 2016-03-09 ENCOUNTER — Encounter (HOSPITAL_BASED_OUTPATIENT_CLINIC_OR_DEPARTMENT_OTHER): Payer: Self-pay | Admitting: Certified Registered"

## 2016-03-09 ENCOUNTER — Ambulatory Visit
Admission: RE | Admit: 2016-03-09 | Discharge: 2016-03-09 | Disposition: A | Discharge status: Home / Self Care | Payer: Commercial Managed Care - HMO | Source: Ambulatory Visit | Attending: General Surgery | Admitting: General Surgery

## 2016-03-09 ENCOUNTER — Ambulatory Visit (HOSPITAL_BASED_OUTPATIENT_CLINIC_OR_DEPARTMENT_OTHER)
Admission: RE | Admit: 2016-03-09 | Discharge: 2016-03-09 | Disposition: A | Discharge status: Home / Self Care | Payer: Commercial Managed Care - HMO | Source: Ambulatory Visit | Attending: General Surgery | Admitting: General Surgery

## 2016-03-09 DIAGNOSIS — F840 Autistic disorder: Secondary | ICD-10-CM | POA: Diagnosis not present

## 2016-03-09 DIAGNOSIS — E039 Hypothyroidism, unspecified: Secondary | ICD-10-CM | POA: Insufficient documentation

## 2016-03-09 DIAGNOSIS — E2740 Unspecified adrenocortical insufficiency: Secondary | ICD-10-CM | POA: Insufficient documentation

## 2016-03-09 DIAGNOSIS — R921 Mammographic calcification found on diagnostic imaging of breast: Secondary | ICD-10-CM | POA: Insufficient documentation

## 2016-03-09 DIAGNOSIS — N6489 Other specified disorders of breast: Secondary | ICD-10-CM | POA: Diagnosis not present

## 2016-03-09 DIAGNOSIS — Z8589 Personal history of malignant neoplasm of other organs and systems: Secondary | ICD-10-CM | POA: Diagnosis not present

## 2016-03-09 DIAGNOSIS — N62 Hypertrophy of breast: Secondary | ICD-10-CM | POA: Insufficient documentation

## 2016-03-09 DIAGNOSIS — D241 Benign neoplasm of right breast: Secondary | ICD-10-CM | POA: Diagnosis not present

## 2016-03-09 DIAGNOSIS — N6011 Diffuse cystic mastopathy of right breast: Secondary | ICD-10-CM | POA: Diagnosis not present

## 2016-03-09 HISTORY — PX: BREAST LUMPECTOMY WITH RADIOACTIVE SEED LOCALIZATION: SHX6424

## 2016-03-09 SURGERY — BREAST LUMPECTOMY WITH RADIOACTIVE SEED LOCALIZATION
Anesthesia: General | Site: Breast | Laterality: Right

## 2016-03-09 MED ORDER — LACTATED RINGERS IV SOLN
INTRAVENOUS | Status: DC
  Administered 2016-03-09: 10 mL/h via INTRAVENOUS
  Administered 2016-03-09: 11:00:00 via INTRAVENOUS

## 2016-03-09 MED ORDER — PHENYLEPHRINE 40 MCG/ML (10ML) SYRINGE FOR IV PUSH (FOR BLOOD PRESSURE SUPPORT)
PREFILLED_SYRINGE | INTRAVENOUS | Status: AC
  Filled 2016-03-09: qty 10

## 2016-03-09 MED ORDER — CEFAZOLIN SODIUM-DEXTROSE 2-4 GM/100ML-% IV SOLN
INTRAVENOUS | Status: AC
  Filled 2016-03-09: qty 100

## 2016-03-09 MED ORDER — ONDANSETRON HCL 4 MG/2ML IJ SOLN
INTRAMUSCULAR | Status: AC
  Filled 2016-03-09: qty 2

## 2016-03-09 MED ORDER — PROMETHAZINE HCL 25 MG/ML IJ SOLN: 6.2500 mg | INTRAMUSCULAR | Status: DC | PRN

## 2016-03-09 MED ORDER — FENTANYL CITRATE (PF) 100 MCG/2ML IJ SOLN
INTRAMUSCULAR | Status: AC
  Filled 2016-03-09: qty 2

## 2016-03-09 MED ORDER — MIDAZOLAM HCL 2 MG/2ML IJ SOLN: 1.0000 mg | INTRAMUSCULAR | Status: DC | PRN

## 2016-03-09 MED ORDER — CHLORHEXIDINE GLUCONATE 4 % EX LIQD: 1.0000 "application " | Freq: Once | CUTANEOUS | Status: DC

## 2016-03-09 MED ORDER — BUPIVACAINE HCL (PF) 0.25 % IJ SOLN
INTRAMUSCULAR | Status: DC | PRN
  Administered 2016-03-09: 16 mL

## 2016-03-09 MED ORDER — BUPIVACAINE HCL (PF) 0.25 % IJ SOLN
INTRAMUSCULAR | Status: AC
  Filled 2016-03-09: qty 30

## 2016-03-09 MED ORDER — GLYCOPYRROLATE 0.2 MG/ML IJ SOLN: 0.2000 mg | Freq: Once | INTRAMUSCULAR | Status: DC | PRN

## 2016-03-09 MED ORDER — FENTANYL CITRATE (PF) 100 MCG/2ML IJ SOLN: 25.0000 ug | INTRAMUSCULAR | Status: DC | PRN

## 2016-03-09 MED ORDER — ONDANSETRON HCL 4 MG/2ML IJ SOLN
INTRAMUSCULAR | Status: DC | PRN
  Administered 2016-03-09: 4 mg via INTRAVENOUS

## 2016-03-09 MED ORDER — FENTANYL CITRATE (PF) 100 MCG/2ML IJ SOLN
50.0000 ug | INTRAMUSCULAR | Status: DC | PRN
  Administered 2016-03-09: 50 ug via INTRAVENOUS

## 2016-03-09 MED ORDER — LIDOCAINE 2% (20 MG/ML) 5 ML SYRINGE
INTRAMUSCULAR | Status: DC | PRN
  Administered 2016-03-09: 100 mg via INTRAVENOUS

## 2016-03-09 MED ORDER — CEFAZOLIN SODIUM-DEXTROSE 2-4 GM/100ML-% IV SOLN
2.0000 g | INTRAVENOUS | Status: AC
  Administered 2016-03-09: 2 g via INTRAVENOUS

## 2016-03-09 MED ORDER — HYDROCODONE-ACETAMINOPHEN 5-325 MG PO TABS: 1.0000 | ORAL_TABLET | ORAL | 0 refills | Status: DC | PRN

## 2016-03-09 MED ORDER — HYDROCORTISONE NA SUCCINATE PF 100 MG IJ SOLR
INTRAMUSCULAR | Status: DC | PRN
  Administered 2016-03-09: 25 mg via INTRAVENOUS

## 2016-03-09 MED ORDER — PROPOFOL 10 MG/ML IV BOLUS
INTRAVENOUS | Status: DC | PRN
  Administered 2016-03-09: 200 mg via INTRAVENOUS

## 2016-03-09 MED ORDER — SCOPOLAMINE 1 MG/3DAYS TD PT72: 1.0000 | MEDICATED_PATCH | Freq: Once | TRANSDERMAL | Status: DC | PRN

## 2016-03-09 MED ORDER — LIDOCAINE 2% (20 MG/ML) 5 ML SYRINGE
INTRAMUSCULAR | Status: AC
  Filled 2016-03-09: qty 5

## 2016-03-09 MED ORDER — PHENYLEPHRINE HCL 10 MG/ML IJ SOLN
INTRAMUSCULAR | Status: DC | PRN
  Administered 2016-03-09: 80 ug via INTRAVENOUS
  Administered 2016-03-09 (×2): 40 ug via INTRAVENOUS
  Administered 2016-03-09: 80 ug via INTRAVENOUS

## 2016-03-09 SURGICAL SUPPLY — 45 items
APPLIER CLIP 9.375 MED OPEN (MISCELLANEOUS)
APR CLP MED 9.3 20 MLT OPN (MISCELLANEOUS)
BLADE SURG 15 STRL LF DISP TIS (BLADE) ×1 IMPLANT
BLADE SURG 15 STRL SS (BLADE) ×3
CANISTER SUC SOCK COL 7IN (MISCELLANEOUS) ×3 IMPLANT
CANISTER SUCT 1200ML W/VALVE (MISCELLANEOUS) ×3 IMPLANT
CHLORAPREP W/TINT 26ML (MISCELLANEOUS) ×3 IMPLANT
CLIP APPLIE 9.375 MED OPEN (MISCELLANEOUS) IMPLANT
COVER BACK TABLE 60X90IN (DRAPES) ×3 IMPLANT
COVER MAYO STAND STRL (DRAPES) ×3 IMPLANT
COVER PROBE W GEL 5X96 (DRAPES) ×3 IMPLANT
DECANTER SPIKE VIAL GLASS SM (MISCELLANEOUS) IMPLANT
DEVICE DUBIN W/COMP PLATE 8390 (MISCELLANEOUS) ×3 IMPLANT
DRAPE LAPAROSCOPIC ABDOMINAL (DRAPES) IMPLANT
DRAPE UTILITY XL STRL (DRAPES) ×3 IMPLANT
ELECT COATED BLADE 2.86 ST (ELECTRODE) ×3 IMPLANT
ELECT REM PT RETURN 9FT ADLT (ELECTROSURGICAL) ×3
ELECTRODE REM PT RTRN 9FT ADLT (ELECTROSURGICAL) ×1 IMPLANT
GLOVE BIO SURGEON STRL SZ7.5 (GLOVE) ×9 IMPLANT
GLOVE BIOGEL PI IND STRL 7.0 (GLOVE) IMPLANT
GLOVE BIOGEL PI IND STRL 8 (GLOVE) IMPLANT
GLOVE BIOGEL PI INDICATOR 7.0 (GLOVE) ×2
GLOVE BIOGEL PI INDICATOR 8 (GLOVE) ×2
GOWN STRL REUS W/ TWL LRG LVL3 (GOWN DISPOSABLE) ×2 IMPLANT
GOWN STRL REUS W/TWL LRG LVL3 (GOWN DISPOSABLE) ×6
ILLUMINATOR WAVEGUIDE N/F (MISCELLANEOUS) IMPLANT
KIT MARKER MARGIN INK (KITS) ×3 IMPLANT
LIGHT WAVEGUIDE WIDE FLAT (MISCELLANEOUS) IMPLANT
LIQUID BAND (GAUZE/BANDAGES/DRESSINGS) ×3 IMPLANT
NDL HYPO 25X1 1.5 SAFETY (NEEDLE) IMPLANT
NEEDLE HYPO 25X1 1.5 SAFETY (NEEDLE) ×3 IMPLANT
NS IRRIG 1000ML POUR BTL (IV SOLUTION) IMPLANT
PACK BASIN DAY SURGERY FS (CUSTOM PROCEDURE TRAY) ×3 IMPLANT
PENCIL BUTTON HOLSTER BLD 10FT (ELECTRODE) ×3 IMPLANT
SLEEVE SCD COMPRESS KNEE MED (MISCELLANEOUS) ×3 IMPLANT
SPONGE LAP 18X18 X RAY DECT (DISPOSABLE) ×3 IMPLANT
SUT MON AB 4-0 PC3 18 (SUTURE) ×2 IMPLANT
SUT SILK 2 0 SH (SUTURE) IMPLANT
SUT VICRYL 3-0 CR8 SH (SUTURE) ×3 IMPLANT
SYR CONTROL 10ML LL (SYRINGE) ×2 IMPLANT
TOWEL OR 17X24 6PK STRL BLUE (TOWEL DISPOSABLE) ×3 IMPLANT
TOWEL OR NON WOVEN STRL DISP B (DISPOSABLE) ×3 IMPLANT
TUBE CONNECTING 20'X1/4 (TUBING) ×1
TUBE CONNECTING 20X1/4 (TUBING) ×2 IMPLANT
YANKAUER SUCT BULB TIP NO VENT (SUCTIONS) IMPLANT

## 2016-03-09 NOTE — Anesthesia Procedure Notes (Signed)
Procedure Name: LMA Insertion Performed by: Baxter Flattery Pre-anesthesia Checklist: Patient identified, Emergency Drugs available, Suction available and Patient being monitored Patient Re-evaluated:Patient Re-evaluated prior to inductionOxygen Delivery Method: Circle system utilized Preoxygenation: Pre-oxygenation with 100% oxygen Intubation Type: IV induction Ventilation: Mask ventilation without difficulty LMA: LMA inserted LMA Size: 4.0 Number of attempts: 1 Airway Equipment and Method: Bite block Placement Confirmation: positive ETCO2 Tube secured with: Tape Dental Injury: Teeth and Oropharynx as per pre-operative assessment

## 2016-03-09 NOTE — Transfer of Care (Signed)
Immediate Anesthesia Transfer of Care Note  Patient: Darren Mcbride  Procedure(s) Performed: Procedure(s): BREAST LUMPECTOMY WITH RADIOACTIVE SEED LOCALIZATION (Right)  Patient Location: PACU  Anesthesia Type:General  Level of Consciousness: awake, alert  and patient cooperative  Airway & Oxygen Therapy: Patient Spontanous Breathing and Patient connected to face mask oxygen  Post-op Assessment: Report given to RN, Post -op Vital signs reviewed and stable and Patient moving all extremities  Post vital signs: Reviewed and stable  Last Vitals:  Vitals:   03/09/16 1002 03/09/16 1231  BP: 140/89 108/73  Pulse: (!) 43 75  Resp: 16 11  Temp: 36.8 C     Last Pain:  Vitals:   03/09/16 1002  TempSrc: Oral         Complications: No apparent anesthesia complications

## 2016-03-09 NOTE — Op Note (Signed)
03/09/2016  12:21 PM  PATIENT:  Darren Mcbride  55 y.o. male  PRE-OPERATIVE DIAGNOSIS:  RIGHT BREAST CALCIFICATION  POST-OPERATIVE DIAGNOSIS:  RIGHT BREAST CALCIFICATION  PROCEDURE:  Procedure(s): BREAST LUMPECTOMY WITH RADIOACTIVE SEED LOCALIZATION (Right)  SURGEON:  Surgeon(s) and Role:    * Jovita Kussmaul, MD - Primary  PHYSICIAN ASSISTANT:   ASSISTANTS: none   ANESTHESIA:   general  EBL:  Total I/O In: 900 [I.V.:900] Out: 25 [Blood:25]  BLOOD ADMINISTERED:none  DRAINS: none   LOCAL MEDICATIONS USED:  MARCAINE     SPECIMEN:  Source of Specimen:  right breast tissue  DISPOSITION OF SPECIMEN:  PATHOLOGY  COUNTS:  YES  TOURNIQUET:  * No tourniquets in log *  DICTATION: .Dragon Dictation   After informed consent was obtained the patient was brought to the operating room and placed in the supine position on the operating room table.  After adequate induction of general anesthesia the patient's right breast was prepped with ChloraPrep, allowed to dry, and draped in usual sterile manner. An appropriate timeout was performed. Previously an I-125 seed was placed in the upper portion of the right breast to mark an area of abnormal microcalcification. The neoprobe was set to I-125 in the area of radioactivity was readily identified in the upper portion of the right breast. An elliptical incision was made with a 15 blade knife in the skin overlying the area of radioactivity. The incision was carried through the skin and subcutaneous tissue sharply with electrocautery. While checking the area of radioactivity frequently a circular portion of breast tissue was excised sharply around the radioactive seed. This dissection was carried all the way to the chest wall. Once the specimen was removed it was oriented with the appropriate paint colors. A specimen radiograph was obtained that showed the calcifications and seed being in the center of the specimen. The specimen was then sent to  pathology for further evaluation. The wound was infiltrated with quarter percent Marcaine and irrigated with saline.  The deep layer of the wound was then closed with interrupted 3-0 Vicryl stitches. The skin was then closed with interrupted 4-0 Monocryl subcuticular stitches. Dermabond dressings were applied. The patient tolerated the procedure well. At the end of the case all needle sponge counts were correct. The patient was then awakened and taken to recovery in stable condition.  PLAN OF CARE: Discharge to home after PACU  PATIENT DISPOSITION:  PACU - hemodynamically stable.   Delay start of Pharmacological VTE agent (>24hrs) due to surgical blood loss or risk of bleeding: not applicable

## 2016-03-09 NOTE — Anesthesia Postprocedure Evaluation (Signed)
Anesthesia Post Note  Patient: Darren Mcbride  Procedure(s) Performed: Procedure(s) (LRB): BREAST LUMPECTOMY WITH RADIOACTIVE SEED LOCALIZATION (Right)  Patient location during evaluation: PACU Anesthesia Type: General Level of consciousness: awake and alert Pain management: pain level controlled Vital Signs Assessment: post-procedure vital signs reviewed and stable Respiratory status: spontaneous breathing, nonlabored ventilation, respiratory function stable and patient connected to nasal cannula oxygen Cardiovascular status: blood pressure returned to baseline and stable Postop Assessment: no signs of nausea or vomiting Anesthetic complications: no Comments: Bigeminy which he had preoperatively.    Last Vitals:  Vitals:   03/09/16 1315 03/09/16 1330  BP: 128/87 122/87  Pulse: (!) 35 86  Resp: 16   Temp:      Last Pain:  Vitals:   03/09/16 1330  TempSrc:   PainSc: 0-No pain                 Denese Mentink J

## 2016-03-09 NOTE — Anesthesia Preprocedure Evaluation (Signed)
Anesthesia Evaluation  Patient identified by MRN, date of birth, ID band Patient awake  General Assessment Comment:Adrenal insufficiency Autism spectrum disorder H/O laryngeal cancer.  Reviewed: Allergy & Precautions, NPO status , Patient's Chart, lab work & pertinent test results  Airway Mallampati: II  TM Distance: >3 FB Neck ROM: Full    Dental no notable dental hx.    Pulmonary neg pulmonary ROS,    Pulmonary exam normal breath sounds clear to auscultation       Cardiovascular + Peripheral Vascular Disease  Normal cardiovascular exam Rhythm:Regular Rate:Normal     Neuro/Psych PSYCHIATRIC DISORDERS negative neurological ROS     GI/Hepatic Neg liver ROS, GERD  ,  Endo/Other  Hypothyroidism   Renal/GU negative Renal ROS  negative genitourinary   Musculoskeletal negative musculoskeletal ROS (+)   Abdominal   Peds negative pediatric ROS (+)  Hematology negative hematology ROS (+)   Anesthesia Other Findings   Reproductive/Obstetrics negative OB ROS                             Anesthesia Physical Anesthesia Plan  ASA: II  Anesthesia Plan: General   Post-op Pain Management:    Induction: Intravenous  Airway Management Planned: LMA  Additional Equipment:   Intra-op Plan:   Post-operative Plan: Extubation in OR  Informed Consent: I have reviewed the patients History and Physical, chart, labs and discussed the procedure including the risks, benefits and alternatives for the proposed anesthesia with the patient or authorized representative who has indicated his/her understanding and acceptance.   Dental advisory given  Plan Discussed with: CRNA  Anesthesia Plan Comments:         Anesthesia Quick Evaluation

## 2016-03-09 NOTE — H&P (Signed)
Darren Mcbride  Location: [facility] Patient #: [id number] DOB: [birth year] Single / Language: Darren Mcbride / Race: Black or African American Male   History of Present Illness  Patient words: R br calc.  The patient is a 55 year old male who presents with a complaint of breast mass. We are asked to see the patient in consultation by Dr. Luberta Mcbride to evaluate him for abnormal calcifications in the right breast. The patient is a 55 year old black male who has a history of autism. He is cared for by his mother. While caring for him she felt a mass in the left breast. The family has a fairly significant history of breast cancer and by their report one of his sisters has the BRCA gene. He was evaluated with mammogram and at that time some abnormal calcifications were seen in the upper portion of the right breast. This area measured 1.2 cm. Because he is a male with thin breast tissue he was not amenable to stereotactic biopsy.   Other Problems Heart murmur Lump In Breast Other disease, cancer, significant illness Thyroid Disease  Past Surgical History  Cataract Surgery Bilateral.  Diagnostic Studies History Colonoscopy within last year  Allergies  No Known Drug Allergies  Medication History  Fludrocortisone Acetate (0.1MG Tablet, Oral) Active. Hydrocortisone (20MG Tablet, Oral) Active. Levothyroxine Sodium (88MCG Tablet, Oral) Active. Medications Reconciled  Social History Caffeine use Carbonated beverages, Tea. No alcohol use No drug use Tobacco use Never smoker.  Family History Alcohol Abuse Brother, Family Members In [place], Father. Arthritis Mother. Breast Cancer Mother, Sister. Diabetes Mellitus Mother, Sister. Heart Disease Brother. Heart disease in male family member before age 20 Hypertension Mother. Melanoma Mother. Ovarian Cancer Family Members In General. Prostate Cancer Family Members In General.    Review of  Systems  General Not Present- Appetite Loss, Chills, Fatigue, Fever, Night Sweats, Weight Gain and Weight Loss. Skin Present- Dryness. Not Present- Change in Wart/Mole, Hives, Jaundice, New Lesions, Non-Healing Wounds, Rash and Ulcer. HEENT Present- Hearing Loss, Visual Disturbances and Wears glasses/contact lenses. Not Present- Earache, Hoarseness, Nose Bleed, Oral Ulcers, Ringing in the Ears, Seasonal Allergies, Sinus Pain, Sore Throat and Yellow Eyes. Respiratory Not Present- Bloody sputum, Chronic Cough, Difficulty Breathing, Snoring and Wheezing. Breast Present- Breast Mass. Not Present- Breast Pain, Nipple Discharge and Skin Changes. Cardiovascular Present- Swelling of Extremities. Not Present- Chest Pain, Difficulty Breathing Lying Down, Leg Cramps, Palpitations, Rapid Heart Rate and Shortness of Breath. Gastrointestinal Present- Change in Bowel Habits and Difficulty Swallowing. Not Present- Abdominal Pain, Bloating, Bloody Stool, Chronic diarrhea, Constipation, Excessive gas, Gets full quickly at meals, Hemorrhoids, Indigestion, Nausea, Rectal Pain and Vomiting. Male Genitourinary Present- Change in Urinary Stream and Frequency. Not Present- Blood in Urine, Impotence, Nocturia, Painful Urination, Urgency and Urine Leakage. Musculoskeletal Present- Swelling of Extremities. Not Present- Back Pain, Joint Pain, Joint Stiffness, Muscle Pain and Muscle Weakness. Neurological Present- Decreased Memory. Not Present- Fainting, Headaches, Numbness, Seizures, Tingling, Tremor, Trouble walking and Weakness. Psychiatric Not Present- Anxiety, Bipolar, Change in Sleep Pattern, Depression, Fearful and Frequent crying. Endocrine Not Present- Cold Intolerance, Excessive Hunger, Hair Changes, Heat Intolerance, Hot flashes and New Diabetes. Hematology Present- Gland problems. Not Present- Easy Bruising, Excessive bleeding, HIV and Persistent Infections.  Vitals  Weight: 151.38 lb Height: 68in Body Surface  Area: 1.82 m Body Mass Index: 23.02 kg/m  BP: 114/80 (Sitting, Left Arm, Standard)       Physical Exam General Mental Status-Alert. General Appearance-Consistent with stated age. Hydration-Well hydrated. Voice-Normal.  Head  and Neck Head-normocephalic, atraumatic with no lesions or palpable masses. Trachea-midline. Thyroid Gland Characteristics - normal size and consistency.  Eye Eyeball - Bilateral-Extraocular movements intact. Sclera/Conjunctiva - Bilateral-No scleral icterus.  Chest and Lung Exam Chest and lung exam reveals -quiet, even and easy respiratory effort with no use of accessory muscles and on auscultation, normal breath sounds, no adventitious sounds and normal vocal resonance. Inspection Chest Wall - Normal. Back - normal.  Breast Note: There is no palpable mass in either breast. There is no palpable axillary, supraclavicular, or cervical lymphadenopathy.   Cardiovascular Note: The heart has a regular rate and rhythm with a systolic murmur   Abdomen Note: The abdomen is soft and nontender. There are multiple well-healed surgical scars.   Neurologic Neurologic evaluation reveals -alert and oriented x 3 with no impairment of recent or remote memory. Mental Status-Normal.  Musculoskeletal Normal Exam - Left-Upper Extremity Strength Normal and Lower Extremity Strength Normal. Normal Exam - Right-Upper Extremity Strength Normal and Lower Extremity Strength Normal.  Lymphatic Head & Neck  General Head & Neck Lymphatics: Bilateral - Description - Normal. Axillary  General Axillary Region: Bilateral - Description - Normal. Tenderness - Non Tender. Femoral & Inguinal  Generalized Femoral & Inguinal Lymphatics: Bilateral - Description - Normal. Tenderness - Non Tender.    Assessment & Plan BREAST CALCIFICATION, RIGHT (R92.1) Impression: The patient appears to have some abnormal calcifications measuring 1.2 cm in  the upper aspect of the right breast. Because of their abnormal appearance of because of his strong family history for breast cancer the recommendation is to have this area biopsied. He compresses too narrowly to have this done in the stereotactic machine. He will therefore need an open right breast radioactive seed localized biopsy of the area. I have discussed with him in detail the risks and benefits of the operation to do this as well as some of the technical aspects and he understands and wishes to proceed Current Plans Pt Education - Mammography: discussed with patient and provided information.

## 2016-03-09 NOTE — Interval H&P Note (Signed)
History and Physical Interval Note:  03/09/2016 10:42 AM  Darren Mcbride  has presented today for surgery, with the diagnosis of RIGHT BREAST CALCIFICATION  The various methods of treatment have been discussed with the patient and family. After consideration of risks, benefits and other options for treatment, the patient has consented to  Procedure(s): BREAST LUMPECTOMY WITH RADIOACTIVE SEED LOCALIZATION (Right) as a surgical intervention .  The patient's history has been reviewed, patient examined, no change in status, stable for surgery.  I have reviewed the patient's chart and labs.  Questions were answered to the patient's satisfaction.     TOTH III,[name]

## 2016-03-09 NOTE — Discharge Instructions (Signed)

## 2016-03-14 ENCOUNTER — Encounter (HOSPITAL_BASED_OUTPATIENT_CLINIC_OR_DEPARTMENT_OTHER): Payer: Self-pay | Admitting: General Surgery

## 2016-03-18 ENCOUNTER — Ambulatory Visit (INDEPENDENT_AMBULATORY_CARE_PROVIDER_SITE_OTHER): Payer: Commercial Managed Care - HMO | Admitting: Internal Medicine

## 2016-03-18 ENCOUNTER — Encounter (INDEPENDENT_AMBULATORY_CARE_PROVIDER_SITE_OTHER): Payer: Self-pay

## 2016-03-18 VITALS — BP 137/93 | HR 82 | Temp 98.0°F | Ht 69.0 in | Wt 150.1 lb

## 2016-03-18 DIAGNOSIS — Z125 Encounter for screening for malignant neoplasm of prostate: Secondary | ICD-10-CM

## 2016-03-18 DIAGNOSIS — N6320 Unspecified lump in the left breast, unspecified quadrant: Secondary | ICD-10-CM

## 2016-03-18 DIAGNOSIS — D126 Benign neoplasm of colon, unspecified: Secondary | ICD-10-CM

## 2016-03-18 DIAGNOSIS — N63 Unspecified lump in breast: Secondary | ICD-10-CM

## 2016-03-18 DIAGNOSIS — Z7189 Other specified counseling: Secondary | ICD-10-CM | POA: Diagnosis not present

## 2016-03-18 DIAGNOSIS — Z8521 Personal history of malignant neoplasm of larynx: Secondary | ICD-10-CM

## 2016-03-18 DIAGNOSIS — Z86010 Personal history of colonic polyps: Secondary | ICD-10-CM | POA: Diagnosis not present

## 2016-03-18 DIAGNOSIS — R634 Abnormal weight loss: Secondary | ICD-10-CM

## 2016-03-18 HISTORY — DX: Encounter for screening for malignant neoplasm of prostate: Z12.5

## 2016-03-18 NOTE — Progress Notes (Signed)
    CC: Weight Loss  HPI: [name] is a 55 y.o. male with PMHx of Autism, h/o primary laryngeal cancer, h/o tubulovillous adenocarcinoma of colon, adrenal insufficiency and hypothyroidism who presents to the clinic for weight loss.  Patient presents today with his mother due to her concern of his weight loss. Patient was recently seen at Dr. Ethlyn Gallery office with a weight of 145 lbs. Both patient and mother deny a change in diet or recent nausea, vomiting or diarrhea. Patient's weight today is clinic is 150 pounds. This is his baseline. On comparison with previous clinic weights, it is stable. January 2107- 150, March 2017- 157, April 2017 152, May 2017 150. Patient does have history of malignancy; however, recent follow ups have been normal: Normal colonoscopy in May 2017, surgical pathology from breast mass in July 2017 revealed no evidence of malignancy, recent visit with his radiation oncologist for his h/o primary laryngeal cancer was also normal. TSH from April 2017 normal on long time dose of Synthroid 88 mcg daily. CBC and BMET from February 2017 stable.    Past Medical History:  Diagnosis Date  . 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 Always had stable electrolytes.    . Allergic rhinitis 02/06/2013  . Autism spectrum disorder 09/22/2011  . Bilateral cataracts    s/p bilateral extraction  . Bilateral high frequency sensorineural hearing loss 10/16/2014   Mild to Moderate on Audiology examination 10/16/2014.  No hearing aides felt needed.  . 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 Cancer ([place])   . Gastroesophageal reflux disease 05/24/2013  . History of radiation therapy 10/28/13-12/04/13   vocal cord,63Gy/50fx  . Hypothyroidism 06/30/2006  . Onychomycosis of toenail 08/30/2013  . Thyroid disease   . 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 Varicose veins of lower extremities  02/06/2013    Review of Systems: A complete ROS was negative except as noted in HPI.   Physical Exam: Vitals:   03/18/16 0950  BP: (!) 137/93  Pulse: 82  Temp: 98 F (36.7 C)  TempSrc: Oral  SpO2: 97%  Weight: 150 lb 1.6 oz (68.1 kg)  Height: 5\' 9"  (1.753 m)   General: Vital signs reviewed.  Patient is in no acute distress and cooperative with exam.  Head: Normocephalic and atraumatic. Neck: Supple, trachea midline, no thyromegaly, or carotid bruit present.  Cardiovascular: RRR, S1 normal, S2 normal, no murmurs, gallops, or rubs. Pulmonary/Chest: Clear to auscultation bilaterally, no wheezes, rales, or rhonchi. Abdominal: Soft, non-tender, non-distended, BS + Extremities: Trace lower extremity edema bilaterally Skin: Warm, dry and intact. No rashes or erythema. Psychiatric: Cognition and memory are abnormal.   Assessment & Plan:  See encounters tab for problem based medical decision making. Patient discussed with Dr. Beryle Beams

## 2016-03-18 NOTE — Assessment & Plan Note (Signed)
Recent colonoscopy in May 2017 was normal with recommendations to repeat in 5 years.  Plan: -Repeat screening colonoscopy in 2022

## 2016-03-18 NOTE — Progress Notes (Signed)
Medicine attending: Medical history, presenting problems, physical findings, and medications, reviewed with resident physician Dr Alexa Burns on the day of the patient visit and I concur with her evaluation and management plan. 

## 2016-03-18 NOTE — Assessment & Plan Note (Signed)
Surgical pathology of breast mass in July 2017 showed no evidence of malignancy only fibrocystic changes with calcification and pseudoangiomatous stromal hyperplasia.  Plan: -Continue to monitor

## 2016-03-18 NOTE — Assessment & Plan Note (Signed)
Patient presents today with his mother due to her concern of his weight loss. Patient was recently seen at Dr. Ethlyn Gallery office with a weight of 145 lbs. Both patient and mother deny a change in diet or recent nausea, vomiting or diarrhea. Patient's weight today is clinic is 150 pounds. This is his baseline. On comparison with previous clinic weights, it is stable. January 2107- 150, March 2017- 157, April 2017 152, May 2017 150. Patient does have history of malignancy; however, recent follow ups have been normal: Normal colonoscopy in May 2017, surgical pathology from breast mass in July 2017 revealed no evidence of malignancy, recent visit with his radiation oncologist for his h/o primary laryngeal cancer was also normal. TSH from April 2017 normal on long time dose of Synthroid 88 mcg daily. CBC and BMET from February 2017 stable.    Assessment: No evidence of weight loss  Plan:  -Reassurance provided -Discussed comparing weights using the same scale, such as our clinic scale for consistency

## 2016-03-18 NOTE — Assessment & Plan Note (Signed)
Patient's mother reports that his recent follow up with Dr. Lisbeth Renshaw, Radiation Oncology, went well without evidence of recurrence.  Plan: -Continue to monitor for symptoms

## 2016-03-18 NOTE — Assessment & Plan Note (Signed)
Patient's mother asked me about prostate cancer screening. He has not had prostate cancer screening before. Risk factors : he does not have a first degree relative has been dx with prostate cancer before the age of 60 yrs or died of prostate cancer before he age of 76 yrs; he is African Optometrist. We discussed that the USPTF discourages prostate cancer screening but that other medical groups contiune to recommend screening. Our discussion included the following :   In an unscreened population, about 5 out of every 1,000 men will die from prostate cancer after 10 years.   At best, PSA screening may help 1 man in 1,000 avoid death from prostate cancer after at least 10 years.  100 - 120 of every 1,000 men screened receive a false positive test. A positive test causes worry and anxiety and usually leads to a biopsy.   One-third of men who have a biopsy have side effects like pain, fever, infection, bleeding, and urinary problems. 1% will need to be hospitalized.  Not all prostate cancer cause an elevated PSA and not all elevated PSA's are from cancer.  In most cases, prostate cancer grows slowly and men die with rather than from prostate cancer. We have no way to distinguish benign prostate cancers from aggressive prostate cancers.   90% of men found to have prostate cancer elect to have treatment.   For every 1,000 men who are screened with the PSA test:  30 to 59 men will develop erectile dysfunction or urinary incontinence due to treatment   2 men will experience a serious cardiovascular event, such as a heart attack, due to treatment   1 man will develop a serious blood clot in his leg or lungs due to treatment   For every 3,000 men who are screened with the PSA test:   1 man will die due to complications from surgical treatment    Patient and mother made a decision to not pursue prostate cancer screening at this time. They would like to continue this discussion with their PCP.

## 2016-04-11 ENCOUNTER — Telehealth: Payer: Self-pay | Admitting: Hematology

## 2016-04-11 ENCOUNTER — Encounter: Payer: Self-pay | Admitting: Hematology

## 2016-04-11 NOTE — Telephone Encounter (Signed)
Appt scheduled with the patient's mother. Scheduled for 9/7 w/Feng at 11am. Ms. Darren Mcbride was aware to have her son arrive 39 minutes early. She is her son's POA. Demographics verified. Letter to the patient.

## 2016-04-21 ENCOUNTER — Ambulatory Visit (HOSPITAL_BASED_OUTPATIENT_CLINIC_OR_DEPARTMENT_OTHER): Payer: Commercial Managed Care - HMO | Admitting: Hematology

## 2016-04-21 ENCOUNTER — Encounter: Payer: Self-pay | Admitting: Hematology

## 2016-04-21 ENCOUNTER — Telehealth: Payer: Self-pay | Admitting: Hematology

## 2016-04-21 ENCOUNTER — Ambulatory Visit (HOSPITAL_BASED_OUTPATIENT_CLINIC_OR_DEPARTMENT_OTHER): Payer: Commercial Managed Care - HMO

## 2016-04-21 VITALS — BP 135/91 | HR 83 | Temp 98.0°F | Resp 18 | Ht 69.0 in | Wt 149.2 lb

## 2016-04-21 DIAGNOSIS — Z803 Family history of malignant neoplasm of breast: Secondary | ICD-10-CM | POA: Diagnosis not present

## 2016-04-21 DIAGNOSIS — N62 Hypertrophy of breast: Secondary | ICD-10-CM

## 2016-04-21 DIAGNOSIS — N6489 Other specified disorders of breast: Secondary | ICD-10-CM

## 2016-04-21 DIAGNOSIS — Z125 Encounter for screening for malignant neoplasm of prostate: Secondary | ICD-10-CM | POA: Diagnosis not present

## 2016-04-21 DIAGNOSIS — Z8521 Personal history of malignant neoplasm of larynx: Secondary | ICD-10-CM

## 2016-04-21 DIAGNOSIS — D649 Anemia, unspecified: Secondary | ICD-10-CM | POA: Diagnosis not present

## 2016-04-21 DIAGNOSIS — F840 Autistic disorder: Secondary | ICD-10-CM

## 2016-04-21 HISTORY — DX: Other specified disorders of breast: N64.89

## 2016-04-21 LAB — CBC WITH DIFFERENTIAL/PLATELET
BASO%: 0.2 % (ref 0.0–2.0)
BASOS ABS: 0 10*3/uL (ref 0.0–0.1)
EOS ABS: 0.1 10*3/uL (ref 0.0–0.5)
EOS%: 2.1 % (ref 0.0–7.0)
HCT: 36.4 % — ABNORMAL LOW (ref 38.4–49.9)
HEMOGLOBIN: 11.1 g/dL — AB (ref 13.0–17.1)
LYMPH#: 1.3 10*3/uL (ref 0.9–3.3)
LYMPH%: 25 % (ref 14.0–49.0)
MCH: 24.1 pg — AB (ref 27.2–33.4)
MCHC: 30.5 g/dL — ABNORMAL LOW (ref 32.0–36.0)
MCV: 79 fL — AB (ref 79.3–98.0)
MONO#: 0.4 10*3/uL (ref 0.1–0.9)
MONO%: 8.2 % (ref 0.0–14.0)
NEUT#: 3.5 10*3/uL (ref 1.5–6.5)
NEUT%: 64.5 % (ref 39.0–75.0)
NRBC: 0 % (ref 0–0)
RBC: 4.61 10*6/uL (ref 4.20–5.82)
RDW: 14.4 % (ref 11.0–14.6)
WBC: 5.4 10*3/uL (ref 4.0–10.3)

## 2016-04-21 LAB — COMPREHENSIVE METABOLIC PANEL
ALBUMIN: 3.6 g/dL (ref 3.5–5.0)
ALK PHOS: 61 U/L (ref 40–150)
ALT: 14 U/L (ref 0–55)
ANION GAP: 7 meq/L (ref 3–11)
AST: 21 U/L (ref 5–34)
BILIRUBIN TOTAL: 0.37 mg/dL (ref 0.20–1.20)
BUN: 17.2 mg/dL (ref 7.0–26.0)
CALCIUM: 9.2 mg/dL (ref 8.4–10.4)
CO2: 31 mEq/L — ABNORMAL HIGH (ref 22–29)
Chloride: 106 mEq/L (ref 98–109)
Creatinine: 1.1 mg/dL (ref 0.7–1.3)
EGFR: 87 mL/min/{1.73_m2} — AB (ref >90)
GLUCOSE: 83 mg/dL (ref 70–140)
POTASSIUM: 4.1 meq/L (ref 3.5–5.1)
SODIUM: 144 meq/L (ref 136–145)
TOTAL PROTEIN: 6.7 g/dL (ref 6.4–8.3)

## 2016-04-21 NOTE — Telephone Encounter (Signed)
Gave relative avs report and appointments for September 2018 and also mammo for May 2018.

## 2016-04-21 NOTE — Progress Notes (Signed)
[facility]  Telephone:([phone]) [phone] Fax:([phone]) [place] Note   Patient Care Team: Oval Linsey, MD as PCP - General (Internal Medicine) Leota Sauers, RN as Registered Nurse (Oncology) 04/21/2016  CHIEF COMPLAINTS/PURPOSE OF CONSULTATION:  High risk of breast cancer   HISTORY OF PRESENTING ILLNESS:  Darren Mcbride 55 y.o. male with PMH of autism and mental retardation, laryngeal cancer, is here because of his recent breast PASH, and strong family history. He is accompanied by his mother to our high-risk clinic. History was taken from from his mother who is also his caregiver.  Patient's mother noticed a left breast mass a few months ago, he denied any pain, skin change or nipple discharge. He underwent diagnostic mammogram and ultrasound, which showed a 1.2 cm group of suspicious calcifications within the right breast 12:00 position. He was referred to breast surgeon Dr. Marlou Starks, and underwent excisional biopsy on 03/09/2016. The surgical path showed fibrocystic change and pseudoangiomatous stromal hyperplasia ([place]). He tolerated the surgery well, has recovered well. Denies any significant pain or other symptoms.  His sister was recently diagnosed with breast cancer, and genetic testing was positive for BRCA2 mutation.  His mother had breast cancer at age of 10. His maternal aunt had colon cancer, and 3 maternal uncle had prostate cancer. He has personal history of laryngeal cancer a few years ago, status post radiation. His colonoscopy showed multiple polyps.   He is able to take care of some basic ADLs, goes to adult school during the day, lives with his mother. He feels well, denies any physical discomfort.    MEDICAL HISTORY:  Past Medical History:  Diagnosis Date  . 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 Always had stable electrolytes.    . Allergic rhinitis 02/06/2013  . Autism  spectrum disorder 09/22/2011  . Bilateral cataracts    s/p bilateral extraction  . Bilateral high frequency sensorineural hearing loss 10/16/2014   Mild to Moderate on Audiology examination 10/16/2014.  No hearing aides felt needed.  . 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 Cancer ([place])   . Gastroesophageal reflux disease 05/24/2013  . History of radiation therapy 10/28/13-12/04/13   vocal cord,63Gy/30f  . Hypothyroidism 06/30/2006  . Onychomycosis of toenail 08/30/2013  . Thyroid disease   . 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 Varicose veins of lower extremities  02/06/2013    SURGICAL HISTORY: Past Surgical History:  Procedure Laterality Date  . BREAST LUMPECTOMY WITH RADIOACTIVE SEED LOCALIZATION Right 03/09/2016   Procedure: BREAST LUMPECTOMY WITH RADIOACTIVE SEED LOCALIZATION;  Surgeon: PAutumn MessingIII, MD;  [facility]  Service: General;  Laterality: Right;  . colon polyps  3-18=14   removal of colon growths"colon polyps"  . COLON SURGERY    . COLONOSCOPY WITH PROPOFOL N/A 11/08/2012   Procedure: COLONOSCOPY WITH PROPOFOL;  Surgeon: DMilus Banister MD;  Location: [facility];  Service: Endoscopy;  Laterality: N/A;  . EYE SURGERY     Bilateral cataract extraction  . HERNIA REPAIR     @ birth    SOCIAL HISTORY: Social History   Social History  . Marital status: Single    Spouse name: N/A  . Number of children: 0  . Years of education: 9th   Occupational History  . sShip broker  Social History Main Topics  . Smoking status: Never Smoker  . Smokeless tobacco: Never Used  . Alcohol use No  . Drug use: No  . Sexual activity: Not Currently   Other Topics Concern  . Not on file   Social History Narrative   ** Merged History Encounter **       Patient lives with his mother at home.   Patient is left-handed.    Patient drinks 2 cups of soda daily at home.   Patient is attending [employer].          FAMILY HISTORY: Family History  Problem Relation Age of Onset  . Diabetes Mother   . Hypertension Mother   . Cataracts Mother   . Breast cancer Mother 48    1985, surgery, chemo and XRT  . Schizophrenia Sister   . Diabetes insipidus Sister   . Breast cancer Sister 22  . Alcoholism Father   . Alcoholism Brother   . Alcoholism Brother   . Alcoholism Brother   . Congestive Heart Failure Brother   . Prostate cancer Paternal Uncle   . Cancer Maternal Aunt     ovarian cancer   . Cancer Maternal Uncle     prostate cancer   . Cancer Maternal Uncle     prostate cancer  . Cancer Maternal Uncle     prostate cancer   . Breast cancer Cousin     4 maternal cousins had breast cancer  . Colon cancer Neg Hx   . Esophageal cancer Neg Hx   . Rectal cancer Neg Hx   . Stomach cancer Neg Hx     ALLERGIES:  has No Known Allergies.  MEDICATIONS:  Current Outpatient Prescriptions  Medication Sig Dispense Refill  . fludrocortisone (FLORINEF) 0.1 MG tablet Take 0.5 tablets (0.05 mg total) by mouth daily. 45 tablet 3  . hydrocortisone (CORTEF) 20 MG tablet Take 1 tablet (20 mg total) by mouth daily. 90 tablet 3  . levothyroxine (SYNTHROID, LEVOTHROID) 88 MCG tablet Take 1 tablet (88 mcg total) by mouth daily. 90 tablet 3  . HYDROcodone-acetaminophen (NORCO/VICODIN) 5-325 MG tablet Take 1-2 tablets by mouth every 4 (four) hours as needed for moderate pain or severe pain. (Patient not taking: Reported on 04/21/2016) 30 tablet 0   No current facility-administered medications for this visit.     REVIEW OF SYSTEMS:   Constitutional: Denies fevers, chills or abnormal night sweats Eyes: Denies blurriness of vision, double vision or watery eyes Ears, nose, mouth, throat, and face: Denies mucositis or sore throat Respiratory: Denies cough, dyspnea or wheezes Cardiovascular: Denies palpitation, chest discomfort or  lower extremity swelling Gastrointestinal:  Denies nausea, heartburn or change in bowel habits Skin: Denies abnormal skin rashes Lymphatics: Denies new lymphadenopathy or easy bruising Neurological:Denies numbness, tingling or new weaknesses Behavioral/Psych: Mood is stable, no new changes  All other systems were reviewed with the patient and are negative.  PHYSICAL EXAMINATION: ECOG PERFORMANCE STATUS: 0 - Asymptomatic  Vitals:   04/21/16 1055  BP: (!) 135/91  Pulse: 83  Resp: 18  Temp: 98 F (36.7 C)   Filed Weights   04/21/16 1055  Weight: 149 lb 3.2 oz (67.7 kg)    GENERAL:alert, no distress and comfortable SKIN: skin color, texture, turgor are normal, no rashes or significant lesions EYES: normal, conjunctiva are pink and non-injected, sclera clear OROPHARYNX:no exudate, no erythema and lips, buccal mucosa, and tongue normal  NECK: supple, thyroid normal size, non-tender, without nodularity LYMPH:  no palpable  lymphadenopathy in the cervical, axillary or inguinal LUNGS: clear to auscultation and percussion with normal breathing effort HEART: regular rate & rhythm and no murmurs and no lower extremity edema ABDOMEN:abdomen soft, non-tender and normal bowel sounds Musculoskeletal:no cyanosis of digits and no clubbing  PSYCH: alert & oriented x 3 with fluent speech NEURO: no focal motor/sensory deficits Breasts: Breast inspection showed them to be symmetrical with no nipple discharge. Surgical incision in right breast has healed well. Palpation of the breasts and axilla revealed no obvious mass that I could appreciate.   LABORATORY DATA:  I have reviewed the data as listed CBC Latest Ref Rng & Units 09/29/2015 06/20/2015 03/07/2014  WBC 4.0 - 10.5 K/uL 4.8 5.9 4.3  Hemoglobin 13.0 - 17.0 g/dL 11.3(L) 11.9(L) 10.9(L)  Hematocrit 39.0 - 52.0 % 37.5(L) 39.3 35.4(L)  Platelets 150 - 400 K/uL 92(L) 92(L) 120(L)   CMP Latest Ref Rng & Units 09/29/2015 06/20/2015 06/19/2015    Glucose 65 - 99 mg/dL 105(H) 111(H) 83  BUN 6 - 20 mg/dL _0 Creatinine 0.61 - 1.24 mg/dL 1.13 0.95 1.13  Sodium 135 - 145 mmol/L 138 141 145(H)  Potassium 3.5 - 5.1 mmol/L 3.8 3.7 4.3  Chloride 101 - 111 mmol/L 98(L) 103 102  CO2 22 - 32 mmol/L 31 32 28  Calcium 8.9 - 10.3 mg/dL 8.5(L) 8.8(L) 9.3  Total Protein 6.5 - 8.1 g/dL 7.3 6.6 -  Total Bilirubin 0.3 - 1.2 mg/dL 0.1(L) 1.0 -  Alkaline Phos 38 - 126 U/L 61 69 -  AST 15 - 41 U/L 30 20 -  ALT 17 - 63 U/L 16(L) 13(L) -   PATHOLOGY REPORT  Diagnosis Breast, lumpectomy, Right - FIBROCYSTIC CHANGES WITH CALCIFICATIONS. - PSEUDOANGIOMATOUS STROMAL HYPERPLASIA ([place]). - NO EVIDENCE OF MALIGNANCY.  Diagnosis 09/13/2013 Vocal cord, biopsy, mass, left INVASIVE SQUAMOUS CELL CARCINOMA, PLEASE SEE COMMENT. Microscopic Comment All the biopsy fragments are involved by an invasive moderately differentiated squamous cell carcinoma with keratin pearl formation and desmoplastic stromal reaction. No angiolymphatic invasion or perineural invasion is identified. P16 stain will be performed and an addendum will follow. Dr. Redmond Baseman was paged.  RADIOGRAPHIC STUDIES: I have personally reviewed the radiological images as listed and agreed with the findings in the report. No results found.  ASSESSMENT & PLAN: 55 year old male, past medical history of autism, mentally retarded lesion, laryngeal cancer, colon polyps, strong family history of breast, ovary and prostate cancers, (+) BRCA2 in her sister, presented with a breast mass  1. Right breast pseudoangiomatous stroma hyperplasia (PASH) and family history of BRCA2 related malignancies  -I reviewed his surgical pathology findings. PAS H is consider a benign breast lesion, not a high risk for breast cancer. -However he has known family history of BRCA2 mutation in his sister, and is strong family history of breast, ovary, and prostate cancer, which are all related to BRCA2 mutations -I recommend  him to consider genetic testing, at least to BRCA2 mutation, to see if he would need prophylactic bilateral mastectomy. However due to his mental health issue, his mother declined genetic testing and prophylactic surgery even if his test was positive. I think this is very reasonable.  -I strongly recommend him to have annual screening mammogram, he agrees. -Giving his mental illness, I did not strongly recommend chemotherapy prevention with tamoxifen. His mother agrees.  -I also encourage him to have prostate screening test, such as annual PSA. His mother agrees -He will continue screening colonoscopy, and follow-up with Dr. Ardis Hughs -His  mother prefers him to follow-up with [place]. I'll see him once a year.  2. History of early stage laryngeal cancer -Status post radiation -Continue follow-up with Dr. Lisbeth Renshaw  3. Autism, mental titration  He will continue follow-up with his primary care physician for other medical issues and preventative medicine.  Plan -lab today for CBC, CMP and PSA -I will see him in one year with lab  -mammogram in 12/2016   All questions were answered. The patient knows to call the clinic with any problems, questions or concerns. I spent 55 minutes counseling the patient face to face. The total time spent in the appointment was 60 minutes and more than 50% was on counseling.     Truitt Merle, MD 04/21/2016   ADDENDUM I reviewed his lab results, PSA and CMP normal, his CBC showed microcytic anemia and mild thrombocytopenia. 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 Will monitor. Will call pt and check his iron study to see if he needs iron supplement.   Truitt Merle  04/24/2016

## 2016-04-22 LAB — PSA: Prostate Specific Ag, Serum: 0.9 ng/mL (ref 0.0–4.0)

## 2016-04-24 ENCOUNTER — Encounter: Payer: Self-pay | Admitting: Hematology

## 2016-04-25 ENCOUNTER — Encounter: Payer: Self-pay | Admitting: Internal Medicine

## 2016-04-26 LAB — IRON AND TIBC CHCC
Iron Saturation: 39 % (ref 15–55)
Iron: 115 ug/dL (ref 38–169)
Total Iron Binding Capacity: 294 ug/dL (ref 250–450)
UIBC: 179 ug/dL (ref 111–343)

## 2016-04-27 ENCOUNTER — Telehealth: Payer: Self-pay | Admitting: *Deleted

## 2016-04-27 NOTE — Telephone Encounter (Signed)
Called and spoke with mother.  Let her know that PSA and CMP are normal, mild anemia and iron study was normal.  We will just watch for now.  She appreciated the phone call.

## 2016-04-27 NOTE — Telephone Encounter (Signed)
-----   Message from Truitt Merle, MD sent at 04/26/2016 10:31 AM EDT ----- Darren Mcbride, please call pt's mother to let her know the lab test results. Normal PSA and CMP, mild anemia, iron study was normal. Will observe. Thanks.  Truitt Merle  04/26/2016

## 2016-06-01 DIAGNOSIS — Z0100 Encounter for examination of eyes and vision without abnormal findings: Secondary | ICD-10-CM | POA: Diagnosis not present

## 2016-06-01 DIAGNOSIS — H524 Presbyopia: Secondary | ICD-10-CM | POA: Diagnosis not present

## 2016-06-09 ENCOUNTER — Telehealth: Payer: Self-pay | Admitting: Internal Medicine

## 2016-06-09 NOTE — Telephone Encounter (Signed)
APT. REMINDER CALL, LMTCB °

## 2016-06-10 ENCOUNTER — Encounter: Payer: Self-pay | Admitting: Internal Medicine

## 2016-06-10 ENCOUNTER — Ambulatory Visit (INDEPENDENT_AMBULATORY_CARE_PROVIDER_SITE_OTHER): Payer: Commercial Managed Care - HMO | Admitting: Internal Medicine

## 2016-06-10 VITALS — BP 112/74 | HR 96 | Temp 98.2°F | Wt 144.9 lb

## 2016-06-10 DIAGNOSIS — E2740 Unspecified adrenocortical insufficiency: Secondary | ICD-10-CM

## 2016-06-10 DIAGNOSIS — Z79899 Other long term (current) drug therapy: Secondary | ICD-10-CM

## 2016-06-10 DIAGNOSIS — F840 Autistic disorder: Secondary | ICD-10-CM | POA: Diagnosis not present

## 2016-06-10 DIAGNOSIS — E039 Hypothyroidism, unspecified: Secondary | ICD-10-CM

## 2016-06-10 DIAGNOSIS — Z8521 Personal history of malignant neoplasm of larynx: Secondary | ICD-10-CM | POA: Diagnosis not present

## 2016-06-10 MED ORDER — FLUDROCORTISONE ACETATE 0.1 MG PO TABS: 0.0500 mg | ORAL_TABLET | Freq: Every day | ORAL | 3 refills | Status: AC

## 2016-06-10 NOTE — Progress Notes (Signed)
   Subjective:    Patient ID: Darren Mcbride, male    DOB: 12-31-1960, 55 y.o.   MRN: [id number]  HPI  Darren Mcbride is here for follow-up of his adrenal insufficiency, hypothyroidism, and autism spectrum disorder. Please see the A&P for the status of the pt's chronic medical problems.  Review of Systems  Constitutional: Negative for activity change, appetite change and unexpected weight change.  HENT: Negative for sore throat, trouble swallowing and voice change.   Respiratory: Negative for chest tightness, shortness of breath and wheezing.   Cardiovascular: Negative for chest pain, palpitations and leg swelling.  Gastrointestinal: Negative for abdominal distention, abdominal pain, constipation, diarrhea, nausea and vomiting.  Musculoskeletal: Negative for arthralgias, back pain and joint swelling.  Skin: Negative for color change, rash and wound.  Neurological: Positive for speech difficulty.       He is having slowly progressive difficulty expressing himself verbally.  He now takes slightly longer than in the past to articulate an idea.  His questions are very insightful and his knowledge of history is quite impressive.  Psychiatric/Behavioral: Negative for behavioral problems.      Objective:   Physical Exam  Constitutional: He is oriented to person, place, and time. He appears well-nourished. No distress.  HENT:  Head: Atraumatic.  Eyes: Conjunctivae are normal. Right eye exhibits no discharge. Left eye exhibits no discharge. No scleral icterus.  Cardiovascular: Normal rate and regular rhythm.  Exam reveals no gallop and no friction rub.   Murmur heard. Pulmonary/Chest: Effort normal and breath sounds normal. No respiratory distress. He has no wheezes. He has no rales. He exhibits no tenderness.  Subareolar firmness on the right.  Supra-areolar surgical scar well healed, clean, and dry.  Abdominal: Soft. Bowel sounds are normal. He exhibits no distension. There is no tenderness.  There is no rebound and no guarding.  Musculoskeletal: Normal range of motion. He exhibits no edema or tenderness.  Neurological: He is alert and oriented to person, place, and time.  Skin: Skin is warm and dry. No rash noted. He is not diaphoretic. No erythema.  Nursing note and vitals reviewed.     Assessment & Plan:   Please see problem based charting.

## 2016-06-10 NOTE — Assessment & Plan Note (Signed)
Assessment  He has no signs or symptoms consistent with clinical hypothyroidism. He states he's been compliant with his levothyroxine 88 g by mouth daily.  Plan  We will continue levothyroxine at 88 g by mouth daily. We will reassess for signs or symptoms of clinical hypothyroidism at the follow-up visit.

## 2016-06-10 NOTE — Assessment & Plan Note (Signed)
Assessment  He was recently assessed by Dr. Lisbeth Renshaw on 02/24/2016 and found to have no evidence of recurrence of his laryngeal cancer.  Plan  The plan is to continue to follow for evidence of recurrence with every six-month examinations by ENT and radiation oncology.

## 2016-06-10 NOTE — Assessment & Plan Note (Signed)
He is due for a repeat mammogram in May 2018. I forgot to provide him with a flu vaccination today and will provided to him at follow-up if he returns prior to the end of the flu vaccination season.

## 2016-06-10 NOTE — Assessment & Plan Note (Signed)
Assessment  He states he's been compliant with his fludrocortisone 0.05 mg by mouth daily and hydrocortisone 20 mg by mouth daily. He needs a refill of his fludrocortisone.  Plan  A refill of his fludrocortisone was sent to his pharmacy and he was asked to continue that and hydrocortisone at 20 mg by mouth daily. We will reassess for signs or symptoms of adrenal insufficiency at the follow-up visit.

## 2016-06-10 NOTE — Patient Instructions (Signed)
It was great to see you again.  You are doing a very good job eating healthy and caring for yourself.  So far everything looks good and we can not find anything new that is wrong.  1) Keep taking the medications as you are.  2) I will see you in 6 months, sooner if necessary.

## 2016-06-10 NOTE — Assessment & Plan Note (Signed)
Assessment  His mother notes he is having more difficulty expressing himself verbally. I agree with her assessment. That being said, he is able to provide much of his care himself, goes to school daily, and remains very smart about historical facts. An example is that he taught me that [facility] Dad Darreld Mclean was scheduled to be in [place] instead of [place] where he was [place]. He also continues to ask very insightful questions such as whether or not his quiet voice may be related to the radiation therapy he received.  Plan  I am concerned that his increasing difficulty expressing himself verbally may be a progression of his underlying autism spectrum disorder. He hears me well and can eventually articulate his ideas. He also continues to ask insightful questions and maintains an impressive knowledge base of historical facts. We will continue to follow his ability to verbally express himself at follow-up visits.

## 2016-06-14 ENCOUNTER — Ambulatory Visit (INDEPENDENT_AMBULATORY_CARE_PROVIDER_SITE_OTHER): Payer: Commercial Managed Care - HMO | Admitting: Internal Medicine

## 2016-06-14 VITALS — BP 130/80 | HR 101 | Temp 98.1°F | Ht 69.0 in | Wt 144.8 lb

## 2016-06-14 DIAGNOSIS — F840 Autistic disorder: Secondary | ICD-10-CM | POA: Diagnosis not present

## 2016-06-14 DIAGNOSIS — H903 Sensorineural hearing loss, bilateral: Secondary | ICD-10-CM

## 2016-06-14 DIAGNOSIS — Z23 Encounter for immunization: Secondary | ICD-10-CM

## 2016-06-14 DIAGNOSIS — Z79899 Other long term (current) drug therapy: Secondary | ICD-10-CM

## 2016-06-14 DIAGNOSIS — E039 Hypothyroidism, unspecified: Secondary | ICD-10-CM | POA: Diagnosis not present

## 2016-06-14 NOTE — Patient Instructions (Signed)
It was nice meeting you all today.  We checked Darren Mcbride thyroid level and will call you with the results.   Please make an appointment with the hearing doctor to have his hearing re-checked.

## 2016-06-14 NOTE — Progress Notes (Signed)
CC: fatigue  HPI:  [name] is a 55 y.o. with a PMH of autism spectrum disorder, bilateral high frequency sensorineural hearing loss, h/o laryngeal cancer, h/o colon polyp and hypothyroidism presenting to clinic with his mother for evaluation of low voice, change in behavior in the last few months and fatigue.   [name] states that she has noticed over the last few months that he has had a soft voice though no hoarseness or change in frequency, does not always respond to her when she addresses him, and reports he is sleeping more during the day. She is concerned that she never knows if something is wrong with him or bothering him as he never has told her in the past about breast lump (which was benign on resection), or of any stomach issues after a polyp was found on routine colonoscopy. She is concerned he is losing weight and reports he "eats like a horse"; she states this is how he was when he almost died in the hospital when he was first found to have hypothyroidism. This causes her a lot of frustration and worry. She is also with several comorbidities herself, is [name] full time caretaker, and helps out her daughter with her health issues.   Please see problem based Assessment and Plan for status of patients chronic conditions.  Past Medical History:  Diagnosis Date  . 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 Always had stable electrolytes.    . Allergic rhinitis 02/06/2013  . Autism spectrum disorder 09/22/2011  . Bilateral cataracts    s/p bilateral extraction  . Bilateral high frequency sensorineural hearing loss 10/16/2014   Mild to Moderate on Audiology examination 10/16/2014.  No hearing aides felt needed.  . 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 Cancer ([place])   . Gastroesophageal reflux disease 05/24/2013  . History of radiation therapy 10/28/13-12/04/13   vocal  cord,63Gy/72f  . Hypothyroidism 06/30/2006  . Onychomycosis of toenail 08/30/2013  . Prostate cancer screening 03/18/2016   BRCA 2 has been identified in family members and there is a history of breast, ovarian, and prostate cancer in his family.  He receives annual PSA screening.  . 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  BRCA 2 has been identified in family members and there is a history of breast, ovarian, and prostate cancer in his family.  He receives annual mammograms and is not interested in chemoprophylaxis or prophylactic bilateral mammograms.  . Thyroid disease   . 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 Varicose veins of lower extremities  02/06/2013    Review of Systems:   Review of Systems  Constitutional: Positive for weight loss. Negative for chills and fever.  HENT: Positive for hearing loss. Negative for congestion.   Respiratory: Negative for cough and shortness of breath.   Cardiovascular: Negative for chest pain.  Gastrointestinal: Negative for abdominal pain, blood in stool, constipation, diarrhea, nausea and vomiting.  Genitourinary: Negative for dysuria.  Neurological: Negative for weakness and headaches.    Physical Exam:  Vitals:   06/14/16 0911  BP: 130/80  Pulse: (!) 101  Temp: 98.1 F (36.7 C)  TempSrc: Oral  SpO2: 96%  Weight: 144 lb 12.8 oz (65.7 kg)  Height: 5' 9"  (1.753 m)   Physical Exam  Constitutional: NAD, sitting comfortably in chair, hard of hearing ENT: oropharynx without erythema or exudates, no thyromegaly, no lymphadenopathy CV: RRR, 3/6 systolic ejection murmur best heard on RUSB, no rubs or gallops, no LE edema, pulses intact Resp: CTAB, no increased work of breathing, no wheezing or crackles  appreciated Abd: soft, +BS, NDNT, no hepatomegaly MSK: moves all 4 extremities freely  Assessment & Plan:   See Encounters Tab for problem based charting.   Patient seen with Dr. Angelia Mould   Alphonzo Grieve, MD Internal Medicine PGY1

## 2016-06-15 ENCOUNTER — Telehealth: Payer: Self-pay | Admitting: Licensed Clinical Social Worker

## 2016-06-15 DIAGNOSIS — F840 Autistic disorder: Secondary | ICD-10-CM

## 2016-06-15 LAB — TSH: TSH: 2.5 u[IU]/mL (ref 0.450–4.500)

## 2016-06-15 NOTE — Telephone Encounter (Signed)
CSW placed called to pt.  CSW left message requesting return call. CSW provided contact hours and phone number. 

## 2016-06-16 ENCOUNTER — Telehealth: Payer: Self-pay | Admitting: Internal Medicine

## 2016-06-16 ENCOUNTER — Encounter: Payer: Self-pay | Admitting: Internal Medicine

## 2016-06-16 NOTE — Telephone Encounter (Signed)
Ms. Owens Shark returned call to [place].  CSW discussed referral from physician to discuss community resources for [name] and if pt would benefit from additional resources.  Mother states patient goes to school Monday - Thursday and is not independent with his ADL's.  CSW inquired as to who provided assistance with ADL's.   Mother requesting to contact CSW at a later date and time.

## 2016-06-16 NOTE — Assessment & Plan Note (Signed)
Mother noting frustration with lack of communication. Likely component of autism spectrum disorder in addition to hearing loss. This seems to be an ongoing issue for at least the last few months. Mrs. Cristian is the main caretaker and appears to be overwhelmed with care of son, daughter and her own health. She was receptive to pursuing social work consult to explore resources available.   Plan: --consult social work for support services for patient and caretaker

## 2016-06-16 NOTE — Telephone Encounter (Signed)
CSW placed called to pt.  CSW left message requesting return call. CSW provided contact hours and phone number. 

## 2016-06-16 NOTE — Assessment & Plan Note (Signed)
Patient with history of mild-mod hearing loss with no indication for hearing aids in 10/2014. Some frustration between patient and mother may be due to progression of hearing loss in both of them.   Plan: --recommended re-eval with audiology

## 2016-06-16 NOTE — Telephone Encounter (Signed)
Ms. Owens Shark returned call to [place].  Mother states she recently had to bury her youngest child.  After the cost of the burial, finances in the home have been tight.  Ms. Owens Shark voiced concern that "something is wrong with Nicole Kindred and they need to find out".  Mr. Matusek was seen in [facility] this week, pt/mother awaiting results.  CSW reassured Ms. Owens Shark, office will contact when result are available.  Pt's mother is primary caregiver and states that she is overwhelmed with transporting Mr. Lusignan to/from medical appointments; along with her daughter who is also receiving treatment. CSW inquired if a PCS request would assist with Mr. Wirkkala care needs.  Mother states by the time he gets out of school, eats and showers it is time for bed; which does not leave time for anyone else to provide services.  Mother voiced again how concerned she was that something is wrong with him.  CSW concerned as Mr. Hoggard care may be overwhelming for Ms. Brown at this time.  Mr. Denner may benefit from group home placement.  This discussion may be best to wait until mother's concerns have been resolved.  CSW inquired if pt/mother would be interested in a referral to [facility] to help coordinate and discuss medical issues and concerns.  Mother agreeable.  Referral placed.

## 2016-06-16 NOTE — Assessment & Plan Note (Signed)
Patient with history of hypothyroidism that has been stable on synthroid 66mcg. Mother is concerned about weight loss and change in behavior and would like to check on his thyroid. Denies other symptoms of hyperthyroidism.  Plan: --TSH - 2.5; continue levothyroxine 31mcg daily

## 2016-06-16 NOTE — Telephone Encounter (Signed)
Contacted patient's mother about results of TSH which was in normal range. She communicated concern that there is still not an answer for his behavior. Assured her that all testing has been unremarkable so far including CMP, CBC, colonoscopy, laryngoscopy for monitoring of recurrence of laryngeal cancer. Discussed again the probability of hearing loss in both the patient and mother could be leading to him not acknowledging what she says and increased frustration in both parties over lack of proper communication. She stated she is making an appointment with audiology to get Mr. Dey hearing evaluated again.   Social work has been consulted and has been in contact with Mrs. Eulas Post. Confirm she is under a lot of stress and overwhelmed with taking care of patient, his sister, and her own health. Social work suggests bringing up group home placement for patient. A [facility] referral has been placed.  Mrs. Stensrud stated she appreciated the call and had no further questions.  Alphonzo Grieve, MD IMTS - PGY1 Pager [phone]

## 2016-06-20 NOTE — Progress Notes (Signed)
Internal Medicine Clinic Attending  I saw and evaluated the patient.  I personally confirmed the key portions of the history and exam documented by Dr. Svalina and I reviewed pertinent patient test results.  The assessment, diagnosis, and plan were formulated together and I agree with the documentation in the resident's note.  

## 2016-06-24 NOTE — Telephone Encounter (Signed)
This encounter was created in error - please disregard.

## 2016-06-30 ENCOUNTER — Other Ambulatory Visit: Payer: Self-pay

## 2016-06-30 NOTE — Patient Outreach (Signed)
[facility]) Care Management  06/30/16  Darren Mcbride Nov 17, 1960 [id number]  Referral received from [facility], SW to reach out to patient's mother "help coordinate and discuss medical issues and concerns."   Successful outreach completed with patient's mother, Darren Mcbride. Patient identification verified. Altha Harm stated she was aware that the doctor's office was going to have someone call. RNCM provided education about [place] and she was agreeable to outreach. Per Altha Harm, patient is at school from 8 am to 4 pm every day. She reports that patient is autistic and has some mental health issues and she needs assistance in caring for him. She reports that she is his healthcare POA.  Unable to fully assess needs during phone call. Patient's mother stated that she has to do everything for him and that he is unable to do anything for himself. She stated that he is in school every day and that gives her a break, but she needs more assistance with him. She reports that he assists him with his ADLs and that he does not talk very much. She feels that he needs counseling. She reports that he used to go to counseling, but unable to verbalize why he is no longer in counseling. She reports that mental health sent him to school at [employer] and she was skeptical at first, but visited and realized it was good for him because he was with other people with similar needs.   RNCM advised it would be best to schedule a home visit with the patient to completely assess the needs and concerns to identify how to best help and she is agreeable. Encouraged to have his medications available at home visit and to have a copy of his POA paperwork for [facility] to make a copy. She verbalized understanding.  Plan: Home visit next week to complete assessment.  [place] R. Eshika Reckart, RN, BSN, [place] Management Coordinator [phone]

## 2016-07-05 ENCOUNTER — Other Ambulatory Visit: Payer: Self-pay

## 2016-07-08 NOTE — Patient Outreach (Addendum)
[facility]) Care Management  [place]  07/05/2016    Darren Mcbride 1961-05-26 [id number]  Subjective: Home visit completed with patient and his mother, Darren Mcbride, and [employer] nursing student, Ernesta Amble.  Objective:  Vitals:   07/05/16 1653  BP: 140/90  Pulse: 85  Resp: 18    Encounter Medications:  Outpatient Encounter Prescriptions as of 07/05/2016  Medication Sig  . fludrocortisone (FLORINEF) 0.1 MG tablet Take 0.5 tablets (0.05 mg total) by mouth daily.  . hydrocortisone (CORTEF) 20 MG tablet Take 1 tablet (20 mg total) by mouth daily.  Marland Kitchen levothyroxine (SYNTHROID, LEVOTHROID) 88 MCG tablet Take 1 tablet (88 mcg total) by mouth daily.   No facility-administered encounter medications on file as of 07/05/2016.     Functional Status:  In your present state of health, do you have any difficulty performing the following activities: 07/05/2016 06/14/2016  Hearing? Tempie Donning  Vision? N N  Difficulty concentrating or making decisions? Tempie Donning  Walking or climbing stairs? Y Y  Dressing or bathing? Y Y  Doing errands, shopping? Tempie Donning  Preparing Food and eating ? N -  Using the Toilet? N -  In the past six months, have you accidently leaked urine? Y -  Do you have problems with loss of bowel control? N -  Managing your Medications? N -  Managing your Finances? Y -  Housekeeping or managing your Housekeeping? Y -  Some recent data might be hidden    Fall/Depression Screening: PHQ 2/9 Scores 07/05/2016 06/14/2016 06/10/2016 03/18/2016 12/25/2015 12/10/2015 08/28/2015  PHQ - 2 Score 0 0 0 0 0 0 0  PHQ- 9 Score - - - - - - -    Assessment: Patient was alert and oriented. He was able to answer questions appropriately. Patient currently denies any pain or concerns. He stated that he did not have any needs.  Patient's mother initially stated that she was concerned that patient has Alzheimer's disease. She reports that patient is becoming more forgetful and drops  things. She also voiced concerns that he was becoming more nonverbal, but during visit, patient was talkative and answered questions appropriately. He stated that his mother often talked about the Bible to him and the end of times and this scares him. Patient reported that he goes to school at [employer] Monday through Friday and works on computers. He stated that he enjoys school. Patient stated that he enjoys watching game shows while at home, reading and listening to gospel music. He stated that he is dropping things, but does not know why. Patient was talkative during his visit, but was often interrupted by his mother. Patient and mother reported that mother sets up bath and patient bathes himself. Patient is able to dress himself, but she checks that his clothing matches. Patient is ambulatory and independent with ADLs. He requires assistance with running errands/going to appointments and his mother assists him with this.  Patient breathing unlabored with normal breath sounds on auscultation. No swelling in extremities.  During the visit, the patient's mother repeatedly tried to turn the focus of the conversation on her needs and health concerns. RNCM attempted to redirect the conversation back to the patient, but it was very challenging.  Based on initial home visit, patient has no medical needs. He reported that he has not had any problems and does not have any needs. Patient's mother is unable to specify specific needs.  Patient's mother did not have POA paperwork at visit, stating that  she could not find it.  Plan: Will close case and send letter to PCP to advise no needs identified during home visit.  [place] R. Ash Mcelwain, RN, BSN, [place] Management Coordinator [phone]

## 2016-08-02 ENCOUNTER — Telehealth: Payer: Self-pay | Admitting: *Deleted

## 2016-08-02 NOTE — Telephone Encounter (Signed)
I am in agreement with changing of the brand given the national backorder.  For the time being he is to remain on the same dose.  Please let me know if anything else is required.  Thank You.

## 2016-08-02 NOTE — Telephone Encounter (Signed)
Received fax from pharmacy requesting a change of brand for the levothyroxine from [facility] to [place] due to national backorder.  Will send to pcp for review.  Please advise. Regenia Skeeter, Haydyn Girvan Cassady12/19/20174:22 PM  .

## 2016-08-03 NOTE — Telephone Encounter (Signed)
Pharmacy aware.Regenia Skeeter, Darlene Cassady12/20/20179:40 AM

## 2016-08-16 ENCOUNTER — Ambulatory Visit (INDEPENDENT_AMBULATORY_CARE_PROVIDER_SITE_OTHER): Payer: Commercial Managed Care - HMO | Admitting: Internal Medicine

## 2016-08-16 ENCOUNTER — Encounter (INDEPENDENT_AMBULATORY_CARE_PROVIDER_SITE_OTHER): Payer: Self-pay

## 2016-08-16 VITALS — BP 134/94 | HR 89 | Temp 97.8°F | Ht 69.0 in | Wt 149.4 lb

## 2016-08-16 DIAGNOSIS — R413 Other amnesia: Secondary | ICD-10-CM | POA: Diagnosis not present

## 2016-08-16 DIAGNOSIS — F840 Autistic disorder: Secondary | ICD-10-CM

## 2016-08-16 DIAGNOSIS — H903 Sensorineural hearing loss, bilateral: Secondary | ICD-10-CM

## 2016-08-16 NOTE — Assessment & Plan Note (Signed)
Patient has underlying autism spectrum disorder. He has been going to [employer] day school Monday through Friday for many years now. She reports that he has enjoyment of this and has friends there which she enjoys seeing. His caretaker is his mother who reports that she is concerned that he is not "getting anything out of it". I feel that there is some underlying miss education about the patient's neurological and developmental potential and that the mother would benefit from more education in order to allow her to better set her expectations and reduce her overall anxiety of the patient's functional status. It appears that the patient is independent in most of his activities of daily living for assessment of [place] that went to the house. During that assessment no further recommendations were made as the patient is able to care for himself with supervision of his mother. Mother does report however that she is constantly looking out for the patient and I feel that further education about his development of potential will be helpful to her. Additionally patient's caretakers complaining of memory changes as well as hearing loss both of which may have small components to the patient's overall presentation however I feel that the most significant component as his underlying development of disorder.  We will refer the patient to a neuropsychologist for further assessment and recognition of treatment or other resources that may be beneficial to both caretaker and the patient. In the meantime I recommend continuation of his [place] day school program.

## 2016-08-16 NOTE — Assessment & Plan Note (Signed)
Patient has been complaining of hearing loss left greater than right over the last year. He's been evaluated about etiology most recently 10/16/2014 at which time no hearing aides were recommended. I would recommend repeat evaluation by audiology given continued complaints. Examination today does not reveal any anatomic issues in the external or middle ear that might be contributing. Patient has some mild buildup of cerumen which is not thought to be contributing to hearing loss. On gross testing of hearing he has some left-sided deficits which would be better suited with formal evaluation by audiology. This referral was placed today.

## 2016-08-16 NOTE — Patient Instructions (Signed)
We will make a referral to the audiologist for another evaluation of your hearing.  We will also make a referral to the neurologist for further evaluation of you memory loss. You may benefit from a group home and we will place another referral social work for more resources.

## 2016-08-16 NOTE — Assessment & Plan Note (Signed)
Patient's caretaker complaining of progressive change in memory over the last year. She reports that patient forgets where he places things, does not listen to commands well and has general lack of attention. Apparently this has been going on over the last year and has been getting progressively worse. Patient has underlying autism spectrum disorder and a suspected complaints may be related to baseline developmental level. Performed a mini cog assessment in the office today which demonstrated 5 out of 5 total points and no obvious deficits. Patient was interactive during the interview and able to answer questions appropriately.  I suspect that the patient's underlying deficits are related more to his neurodevelopmental issues than any underlying dementia at this point. He may also have some component of hearing loss which is contributing to the issues which the caretaker raises. Patient himself is not complaining of memory loss or other attention issues. Would recommend further evaluation for his autism spectrum disorder and complaints of memory change with neuropsychologist and place this referral.

## 2016-08-16 NOTE — Progress Notes (Signed)
CC: hearing loss and memory changes HPI: [name] is a 56 y.o. male with a h/o of autism spectrum d/o, adrenal insufficiency who presents for evaluation of hearing loss and memory complaints.  Please see Problem-based charting for HPI and the status of patient's chronic medical conditions.  Past Medical History:  Diagnosis Date  . 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 Always had stable electrolytes.    . Allergic rhinitis 02/06/2013  . Autism spectrum disorder 09/22/2011  . Bilateral cataracts    s/p bilateral extraction  . Bilateral high frequency sensorineural hearing loss 10/16/2014   Mild to Moderate on Audiology examination 10/16/2014.  No hearing aides felt needed.  . 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 Cancer ([place])   . Gastroesophageal reflux disease 05/24/2013  . History of radiation therapy 10/28/13-12/04/13   vocal cord,63Gy/67f  . Hypothyroidism 06/30/2006  . Onychomycosis of toenail 08/30/2013  . Prostate cancer screening 03/18/2016   BRCA 2 has been identified in family members and there is a history of breast, ovarian, and prostate cancer in his family.  He receives annual PSA screening.  . 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  BRCA 2 has been identified in family members and there is a history of breast, ovarian, and prostate cancer in his family.  He receives annual mammograms and is not interested in chemoprophylaxis or prophylactic bilateral mammograms.  . Thyroid disease   . 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 Varicose veins of lower extremities  02/06/2013    Social Hx: Social History  Substance Use Topics  . Smoking  status: Never Smoker  . Smokeless tobacco: Never Used  . Alcohol use No   Review of Systems: ROS in HPI. Otherwise: Review of Systems  Constitutional: Negative for chills, fever and weight loss.  HENT: Positive for hearing loss. Negative for congestion, ear discharge, ear pain, nosebleeds, sinus pain, sore throat and tinnitus.   Respiratory: Negative for cough, shortness of breath and stridor.   Cardiovascular: Negative for chest pain and leg swelling.  Gastrointestinal: Negative for abdominal pain, constipation, diarrhea, nausea and vomiting.  Genitourinary: Negative for dysuria, frequency and urgency.  Psychiatric/Behavioral: Positive for memory loss.    Physical Exam: Vitals:   08/16/16 1027  BP: (!) 134/94  Pulse: 89  Temp: 97.8 F (36.6 C)  TempSrc: Oral  SpO2: 95%  Weight: 149 lb 6.4 oz (67.8 kg)  Height: _0  (1.753 m)   Physical Exam  Constitutional: He is oriented to person, place, and time. He is cooperative. No distress.  HENT:  Head: Normocephalic and atraumatic.  Right Ear: Hearing, tympanic membrane, external ear and ear canal normal. No drainage, swelling or tenderness. No foreign bodies. No middle ear effusion. No decreased hearing is noted.  Left Ear: Tympanic membrane, external ear and ear canal normal. No drainage, swelling or tenderness. No foreign bodies.  No middle ear effusion. Decreased hearing is noted.  Cardiovascular: Normal rate, regular rhythm and normal pulses.   Pulmonary/Chest: Effort normal and breath sounds normal. No respiratory distress. He has no rhonchi.  Abdominal: Soft. There is no tenderness.  Musculoskeletal: Normal range of motion. He exhibits no deformity.  Neurological: He is alert and oriented to person,  place, and time.  Skin: Skin is intact.  Psychiatric: His speech is normal. His affect is blunt. He is withdrawn. Cognition and memory are normal. Cognition and memory are not impaired. He exhibits normal recent memory and normal  remote memory.    Assessment & Plan:  See encounters tab for problem based medical decision making. Patient seen with Dr. Lynnae January  Memory change Patient's caretaker complaining of progressive change in memory over the last year. She reports that patient forgets where he places things, does not listen to commands well and has general lack of attention. Apparently this has been going on over the last year and has been getting progressively worse. Patient has underlying autism spectrum disorder and a suspected complaints may be related to baseline developmental level. Performed a mini cog assessment in the office today which demonstrated 5 out of 5 total points and no obvious deficits. Patient was interactive during the interview and able to answer questions appropriately.  I suspect that the patient's underlying deficits are related more to his neurodevelopmental issues than any underlying dementia at this point. He may also have some component of hearing loss which is contributing to the issues which the caretaker raises. Patient himself is not complaining of memory loss or other attention issues. Would recommend further evaluation for his autism spectrum disorder and complaints of memory change with neuropsychologist and place this referral.  Bilateral high frequency sensorineural hearing loss Patient has been complaining of hearing loss left greater than right over the last year. He's been evaluated about etiology most recently 10/16/2014 at which time no hearing aides were recommended. I would recommend repeat evaluation by audiology given continued complaints. Examination today does not reveal any anatomic issues in the external or middle ear that might be contributing. Patient has some mild buildup of cerumen which is not thought to be contributing to hearing loss. On gross testing of hearing he has some left-sided deficits which would be better suited with formal evaluation by audiology. This  referral was placed today.  Autism spectrum disorder Patient has underlying autism spectrum disorder. He has been going to [employer] day school Monday through Friday for many years now. She reports that he has enjoyment of this and has friends there which she enjoys seeing. His caretaker is his mother who reports that she is concerned that he is not "getting anything out of it". I feel that there is some underlying miss education about the patient's neurological and developmental potential and that the mother would benefit from more education in order to allow her to better set her expectations and reduce her overall anxiety of the patient's functional status. It appears that the patient is independent in most of his activities of daily living for assessment of [place] that went to the house. During that assessment no further recommendations were made as the patient is able to care for himself with supervision of his mother. Mother does report however that she is constantly looking out for the patient and I feel that further education about his development of potential will be helpful to her. Additionally patient's caretakers complaining of memory changes as well as hearing loss both of which may have small components to the patient's overall presentation however I feel that the most significant component as his underlying development of disorder.  We will refer the patient to a neuropsychologist for further assessment and recognition of treatment or other resources that may be beneficial to both caretaker and the patient. In the meantime I  recommend continuation of his [employer] day school program.   Signed: Holley Raring, MD 08/16/2016, 11:45 AM  Pager: [phone]

## 2016-08-19 NOTE — Progress Notes (Signed)
Internal Medicine Clinic Attending  Case discussed with Dr. Strelow at the time of the visit.  We reviewed the resident's history and exam and pertinent patient test results.  I agree with the assessment, diagnosis, and plan of care documented in the resident's note.  

## 2016-08-24 ENCOUNTER — Ambulatory Visit
Admission: RE | Admit: 2016-08-24 | Discharge: 2016-08-24 | Disposition: A | Discharge status: Home / Self Care | Payer: Commercial Managed Care - HMO | Source: Ambulatory Visit | Attending: Radiation Oncology | Admitting: Radiation Oncology

## 2016-08-24 ENCOUNTER — Encounter: Payer: Self-pay | Admitting: Radiation Oncology

## 2016-08-24 VITALS — BP 142/101 | HR 75 | Temp 98.1°F | Resp 20 | Ht 69.0 in | Wt 149.4 lb

## 2016-08-24 DIAGNOSIS — Z8521 Personal history of malignant neoplasm of larynx: Secondary | ICD-10-CM | POA: Diagnosis not present

## 2016-08-24 DIAGNOSIS — C320 Malignant neoplasm of glottis: Secondary | ICD-10-CM | POA: Insufficient documentation

## 2016-08-24 DIAGNOSIS — R1310 Dysphagia, unspecified: Secondary | ICD-10-CM | POA: Diagnosis not present

## 2016-08-24 DIAGNOSIS — E039 Hypothyroidism, unspecified: Secondary | ICD-10-CM | POA: Insufficient documentation

## 2016-08-24 DIAGNOSIS — F840 Autistic disorder: Secondary | ICD-10-CM | POA: Diagnosis not present

## 2016-08-24 DIAGNOSIS — Z08 Encounter for follow-up examination after completed treatment for malignant neoplasm: Secondary | ICD-10-CM | POA: Diagnosis not present

## 2016-08-24 MED ORDER — LARYNGOSCOPY SOLUTION RAD-ONC
15.0000 mL | Freq: Once | TOPICAL | Status: AC
  Administered 2016-08-24: 15 mL via TOPICAL
  Filled 2016-08-24: qty 15

## 2016-08-24 NOTE — Progress Notes (Addendum)
Follow up vocal cord radiation3/16/15-4/22/15  patient denies nausea, pain, at times food still gets stuck at sternum  Not every day,  mother says he is more confused, memory loss and hearing loss,  lips cracked and dry,  Says he drinks water bid,, and milk shakes, took vitals left arm BP (!) 140/100 (BP Location: Left Arm, Patient Position: Sitting, Cuff Size: Normal)   Pulse 83   Temp 98.1 F (36.7 C) (Oral)   Resp 20   Ht 5\' 9"  (1.753 m)   Wt 149 lb 6.4 oz (67.8 kg)   SpO2 100% Comment: room air  BMI 22.06 kg/m   Then took vitals Right arm= BP (!) 142/101 (BP Location: Right Arm, Patient Position: Sitting, Cuff Size: Normal)   Pulse 75   Temp 98.1 F (36.7 C) (Oral)   Resp 20   Ht 5\' 9"  (1.753 m)   Wt 149 lb 6.4 oz (67.8 kg)   SpO2 100% Comment: room air  BMI 22.06 kg/m   Last TSH taken 06/14/16=2.500 Patient states regular bowel movements and bladder okay, but patient very flat affect Wt Readings from Last 3 Encounters:  08/24/16 149 lb 6.4 oz (67.8 kg)  08/16/16 149 lb 6.4 oz (67.8 kg)  06/14/16 144 lb 12.8 oz (65.7 kg)

## 2016-08-24 NOTE — Progress Notes (Signed)
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 He has been NED in the last 2 years and comes today for follow-up.  On review of systems, the patient denies pain. He reports food still occasionally gets stuck at sternum when he is eating. He denies nausea. He reports regular bladder and bowel activity. His mother is present today and reports that he is more confused as of late, which she attributes to memory and hearing loss. She reports he drinks water and milkshakes. His mother reports his last GI visit was "about a year ago." A complete review of systems is obtained and is negative.  Past Medical History:  Past Medical History:  Diagnosis Date  . 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 Always had stable electrolytes.    . Allergic rhinitis 02/06/2013  . Autism spectrum disorder 09/22/2011  . Bilateral cataracts    s/p bilateral extraction  . Bilateral high frequency sensorineural hearing loss 10/16/2014   Mild to Moderate on Audiology examination 10/16/2014.  No hearing aides felt needed.  . 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 Cancer ([place])   . Gastroesophageal reflux disease 05/24/2013  . History of radiation therapy 10/28/13-12/04/13   vocal cord,63Gy/86f  . Hypothyroidism 06/30/2006  . Onychomycosis of toenail 08/30/2013  . Prostate cancer screening 03/18/2016   BRCA 2  has been identified in family members and there is a history of breast, ovarian, and prostate cancer in his family.  He receives annual PSA screening.  . 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  BRCA 2 has been identified in family members and there is a history of breast, ovarian, and prostate cancer in his family.  He receives annual mammograms and is not interested in chemoprophylaxis or prophylactic bilateral mammograms.  . Thyroid disease   . 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 Varicose veins of lower extremities  02/06/2013    Past Surgical History: Past Surgical History:  Procedure Laterality Date  . BREAST LUMPECTOMY WITH RADIOACTIVE SEED LOCALIZATION Right 03/09/2016   Procedure: BREAST LUMPECTOMY WITH RADIOACTIVE SEED LOCALIZATION;  Surgeon: PAutumn MessingIII, MD;  Location: [facility]  Service: General;  Laterality: Right;  . colon polyps  3-18=14   removal of colon growths"colon polyps"  . COLON SURGERY    . COLONOSCOPY WITH PROPOFOL N/A 11/08/2012   Procedure: COLONOSCOPY WITH PROPOFOL;  Surgeon: DMilus Banister MD;  [facility];  Service: Endoscopy;  Laterality: N/A;  . EYE SURGERY     Bilateral cataract extraction  . HERNIA REPAIR     @ birth    Social History:  Social History   Social History  . Marital  status: Single    Spouse name: N/A  . Number of children: 0  . Years of education: 9th   Occupational History  . student    Social History Main Topics  . Smoking status: Never Smoker  . Smokeless tobacco: Never Used  . Alcohol use No  . Drug use: No  . Sexual activity: Not Currently   Other Topics Concern  . Not on file   Social History Narrative   ** Merged History Encounter **       Patient  lives with his mother at home.   Patient is left-handed.   Patient drinks 2 cups of soda daily at home.   Patient is attending [employer].          Family History: Family History  Problem Relation Age of Onset  . Diabetes Mother   . Hypertension Mother   . Cataracts Mother   . Breast cancer Mother 52    1985, surgery, chemo and XRT  . Schizophrenia Sister   . Diabetes insipidus Sister   . Breast cancer Sister 68    BRCA2 Positive  . Alcoholism Father   . Alcoholism Brother   . Alcoholism Brother   . Alcoholism Brother   . Congestive Heart Failure Brother   . Prostate cancer Paternal Uncle   . Cancer Maternal Aunt     ovarian cancer   . Cancer Maternal Uncle     prostate cancer   . Cancer Maternal Uncle     prostate cancer  . Cancer Maternal Uncle     prostate cancer   . Breast cancer Cousin     4 maternal cousins had breast cancer  . Colon cancer Neg Hx   . Esophageal cancer Neg Hx   . Rectal cancer Neg Hx   . Stomach cancer Neg Hx      ALLERGIES:  has No Known Allergies.  Meds: Current Outpatient Prescriptions  Medication Sig Dispense Refill  . fludrocortisone (FLORINEF) 0.1 MG tablet Take 0.5 tablets (0.05 mg total) by mouth daily. 45 tablet 3  . hydrocortisone (CORTEF) 20 MG tablet Take 1 tablet (20 mg total) by mouth daily. 90 tablet 3  . levothyroxine (SYNTHROID, LEVOTHROID) 88 MCG tablet Take 1 tablet (88 mcg total) by mouth daily. 90 tablet 3   No current facility-administered medications for this encounter.     Physical Findings:  height is 5' 9"  (1.753 m) and weight is 149 lb 6.4 oz (67.8 kg). His oral temperature is 98.1 F (36.7 C). His blood pressure is 142/101 (abnormal) and his pulse is 75. His respiration is 20 and oxygen saturation is 100%.   In general this is a well appearing African American gentleman in no acute distress. He is alert and oriented x4 and appropriate throughout the examination. Skin is intact without any evidence of gross lesions.  Cardiovascular exam reveals a regular rate and rhythm, no clicks rubs or murmurs are auscultated. Chest is clear to auscultation bilaterally. Lymphatic assessment is performed and does not reveal any adenopathy in the cervical, supraclavicular, axillary, or inguinal chains. Abdomen has active bowel sounds in all quadrants and is intact. The abdomen is soft, non tender, non distended.  PROCEDURE NOTE: After obtaining consent and anesthetizing the nasal cavity with topical lidocaine and phenylephrine, the flexible endoscope was introduced and passed through the nasal cavity. Complete visualization was obtained of the vocal cord region. No lesions or suspicious findings within the larynx, hypopharynx, oropharynx or nasopharynx.  Lab Findings: Lab Results  Component Value Date   WBC 5.4 04/21/2016   HGB 11.1 (L) 04/21/2016   HCT 36.4 (L) 04/21/2016   MCV 79.0 (L) 04/21/2016   PLT 126 Large & giant platelets (L) 04/21/2016   Radiographic Findings: No results found.  Impression/Plan:  1. 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 The patient is nearly 3 years post radiation treatment. The patient has no evidence of disease on exam today. He continues to do well clinically. The patient will return in 6 months for routine follow up. His most recent TSH was normal in October 2017, and this will be followed annually. 2. Dysphagia. I will contact [facility] to find out the last time the patient underwent endoscopy. The above documentation reflects my direct findings during this shared patient visit. Please see the separate note by Dr. Lisbeth Renshaw on this date for the remainder of the patient's plan of care.  In a visit lasting 25 minutes, greater than 50% of the time was spent face to face discussing his symptoms of dysphagia, and coordinating the patient's care.    Carola Rhine, PAC   This document serves as a record of services personally performed by Kyung Rudd, MD and Shona Simpson, PA. It  was created on his behalf by Maryla Morrow, a trained medical scribe. The creation of this record is based on the scribe's personal observations and the provider's statements to them. This document has been checked and approved by the attending provider.

## 2016-08-30 ENCOUNTER — Telehealth: Payer: Self-pay | Admitting: Gastroenterology

## 2016-09-02 NOTE — Telephone Encounter (Signed)
08/30/2016 02:07 PM Phone (Incoming) [facility] [phone]   Enid Derry calling from the [facility] states pt is having difficulty swallowing and Dr.Moody is wanting pt to be scheduled for Direct EGD. Please advise for scheduling.     By Lambert Mody, He needs EGD for dysphagia.  He has autism, mental retardation and so probably best if this is scheduled as [facility] case.  When you set this up please make sure that medical power of attorney comes with him since I don't think he can provide informed consent.  EGD next available Thursday [facility] day for dysphagia. Thanks  DJ   Merleen Milliner, Jenny Reichmann

## 2016-09-08 ENCOUNTER — Other Ambulatory Visit: Payer: Self-pay

## 2016-09-08 DIAGNOSIS — R1310 Dysphagia, unspecified: Secondary | ICD-10-CM

## 2016-09-08 NOTE — Telephone Encounter (Signed)
EGD scheduled, pt instructed and medications reviewed.  Patient instructions mailed to home.  Patient to call with any questions or concerns.  

## 2016-09-08 NOTE — Telephone Encounter (Signed)
POA (mother) aware that she needs to be with the pt the day of the procedure.

## 2016-09-23 ENCOUNTER — Ambulatory Visit: Payer: Commercial Managed Care - HMO | Attending: Internal Medicine | Admitting: Audiology

## 2016-09-23 NOTE — Addendum Note (Signed)
Addended by: Hulan Fray on: 09/23/2016 08:49 PM   Modules accepted: Orders

## 2016-10-03 ENCOUNTER — Ambulatory Visit (INDEPENDENT_AMBULATORY_CARE_PROVIDER_SITE_OTHER): Payer: Medicare HMO | Admitting: Psychology

## 2016-10-03 ENCOUNTER — Encounter: Payer: Self-pay | Admitting: Psychology

## 2016-10-03 DIAGNOSIS — R4189 Other symptoms and signs involving cognitive functions and awareness: Secondary | ICD-10-CM

## 2016-10-03 DIAGNOSIS — F840 Autistic disorder: Secondary | ICD-10-CM
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
## 2016-10-05 ENCOUNTER — Encounter (HOSPITAL_COMMUNITY): Payer: Self-pay | Admitting: *Deleted

## 2016-10-05 ENCOUNTER — Telehealth: Payer: Self-pay | Admitting: Gastroenterology

## 2016-10-05 NOTE — Telephone Encounter (Signed)
Left message on machine to call back  

## 2016-10-06 ENCOUNTER — Ambulatory Visit (HOSPITAL_COMMUNITY)
Admission: RE | Admit: 2016-10-06 | Discharge: 2016-10-06 | Disposition: A | Discharge status: Home / Self Care | Payer: Medicare HMO | Source: Ambulatory Visit | Attending: Gastroenterology | Admitting: Gastroenterology

## 2016-10-06 ENCOUNTER — Ambulatory Visit (HOSPITAL_COMMUNITY): Payer: Medicare HMO | Admitting: Certified Registered Nurse Anesthetist

## 2016-10-06 ENCOUNTER — Encounter (HOSPITAL_COMMUNITY)
Admission: RE | Disposition: A | Discharge status: Home / Self Care | Payer: Self-pay | Source: Ambulatory Visit | Attending: Gastroenterology

## 2016-10-06 ENCOUNTER — Encounter (HOSPITAL_COMMUNITY): Payer: Self-pay | Admitting: *Deleted

## 2016-10-06 DIAGNOSIS — Z8521 Personal history of malignant neoplasm of larynx: Secondary | ICD-10-CM | POA: Diagnosis not present

## 2016-10-06 DIAGNOSIS — F840 Autistic disorder: Secondary | ICD-10-CM | POA: Insufficient documentation

## 2016-10-06 DIAGNOSIS — R1310 Dysphagia, unspecified: Secondary | ICD-10-CM | POA: Diagnosis not present

## 2016-10-06 DIAGNOSIS — E039 Hypothyroidism, unspecified: Secondary | ICD-10-CM | POA: Diagnosis not present

## 2016-10-06 DIAGNOSIS — Z79899 Other long term (current) drug therapy: Secondary | ICD-10-CM | POA: Diagnosis not present

## 2016-10-06 DIAGNOSIS — Z923 Personal history of irradiation: Secondary | ICD-10-CM | POA: Insufficient documentation

## 2016-10-06 DIAGNOSIS — K228 Other specified diseases of esophagus: Secondary | ICD-10-CM | POA: Insufficient documentation

## 2016-10-06 DIAGNOSIS — Z7952 Long term (current) use of systemic steroids: Secondary | ICD-10-CM | POA: Insufficient documentation

## 2016-10-06 DIAGNOSIS — K219 Gastro-esophageal reflux disease without esophagitis: Secondary | ICD-10-CM | POA: Insufficient documentation

## 2016-10-06 HISTORY — PX: ESOPHAGOGASTRODUODENOSCOPY (EGD) WITH PROPOFOL: SHX5813

## 2016-10-06 SURGERY — ESOPHAGOGASTRODUODENOSCOPY (EGD) WITH PROPOFOL
Anesthesia: Monitor Anesthesia Care

## 2016-10-06 MED ORDER — LIDOCAINE 2% (20 MG/ML) 5 ML SYRINGE
INTRAMUSCULAR | Status: AC
  Filled 2016-10-06: qty 5

## 2016-10-06 MED ORDER — ONDANSETRON HCL 4 MG/2ML IJ SOLN
INTRAMUSCULAR | Status: AC
  Filled 2016-10-06: qty 2

## 2016-10-06 MED ORDER — ONDANSETRON HCL 4 MG/2ML IJ SOLN
INTRAMUSCULAR | Status: DC | PRN
Start: 2016-10-06 — End: 2016-10-06
  Administered 2016-10-06: 4 mg via INTRAVENOUS

## 2016-10-06 MED ORDER — PROPOFOL 10 MG/ML IV BOLUS
INTRAVENOUS | Status: DC | PRN
  Administered 2016-10-06: 10 mg via INTRAVENOUS

## 2016-10-06 MED ORDER — LACTATED RINGERS IV SOLN
INTRAVENOUS | Status: DC
  Administered 2016-10-06: 1000 mL via INTRAVENOUS

## 2016-10-06 MED ORDER — SODIUM CHLORIDE 0.9 % IV SOLN: INTRAVENOUS | Status: DC

## 2016-10-06 MED ORDER — PROPOFOL 10 MG/ML IV BOLUS
INTRAVENOUS | Status: AC
  Filled 2016-10-06: qty 40

## 2016-10-06 MED ORDER — PROPOFOL 500 MG/50ML IV EMUL
INTRAVENOUS | Status: DC | PRN
  Administered 2016-10-06: 200 ug/kg/min via INTRAVENOUS

## 2016-10-06 SURGICAL SUPPLY — 15 items

## 2016-10-06 NOTE — Telephone Encounter (Signed)
Milus Banister, MD sent to Barron Alvine, RN        He just completed EGD, needs MBSS study with speech therapy. For dysphagia.   thanks

## 2016-10-06 NOTE — Op Note (Signed)
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 Estimated blood loss:                            None. Estimated Blood Loss:     Estimated blood loss: none. Procedure:                Pre-Anesthesia Assessment:                           - Prior to the procedure, a History and Physical                            was performed, and patient medications and                            allergies were reviewed. The patient's tolerance of                            previous anesthesia was also reviewed. The risks                            and benefits of the procedure and the sedation                            options and risks were discussed with the patient.                            All questions were answered, and informed consent                            was obtained. Prior Anticoagulants: The patient has                            taken no previous anticoagulant or antiplatelet  agents. ASA Grade Assessment: III - A patient with                            severe systemic disease. After reviewing the risks                            and benefits, the patient was deemed in                            satisfactory condition to undergo the procedure.                           After obtaining informed consent, the endoscope was                            passed under  direct vision. Throughout the                            procedure, the patient's blood pressure, pulse, and                            oxygen saturations were monitored continuously. The                            [id number]) scope was introduced through the                            mouth, and advanced to the second part of duodenum.                            The upper GI endoscopy was accomplished without                            difficulty. The patient tolerated the procedure                            well. Scope In: Scope Out: Findings:      The oropharyngeal anatomy was more tortuous than is usual. No frankly       neoplastic mucosa noted.      The lumen of the esophagus was mildly dilated, the GE junction was       normal. No strictures or stenosis.      The stomach was normal.      The examined duodenum was normal. Impression:               - The esophagus was slightly dilated but GE                            junction was normal. I am most struck by the                            unusual anatomy of the oropharynx, seemed more  tortuous than is usual. This seems the likely site                            of swallowing difficulty. Moderate Sedation:      N/A- Per Anesthesia Care Recommendation:           - Patient has a contact number available for                            emergencies. The signs and symptoms of potential                            delayed complications were discussed with the                            patient. Return to normal activities tomorrow.                            Written discharge instructions were provided to the                            patient.                           - Resume previous diet.                           - Continue present medications.                           - My office will arrange MBSS with speech therapy                            to evaluate for oropharyngeal dysphagia. Procedure  Code(s):        --- Professional ---                           [phone], Esophagogastroduodenoscopy, flexible,                            transoral; diagnostic, including collection of                            specimen(s) by brushing or washing, when performed                            (separate procedure) Diagnosis Code(s):        --- Professional ---                           K22.8, Other specified diseases of esophagus                           R13.10, Dysphagia, unspecified CPT copyright 2016 American Medical Association. All rights reserved. The codes documented in this report are preliminary and upon coder review may  be revised to meet current compliance requirements. Melene Plan  Ardis Hughs, MD 10/06/2016 7:55:27 AM This report has been signed electronically. Number of Addenda: 0

## 2016-10-06 NOTE — Anesthesia Postprocedure Evaluation (Addendum)
Anesthesia Post Note  Patient: Darren Mcbride  Procedure(s) Performed: Procedure(s) (LRB): ESOPHAGOGASTRODUODENOSCOPY (EGD) WITH PROPOFOL (N/A)  Patient location during evaluation: Endoscopy Anesthesia Type: MAC Level of consciousness: oriented, awake and alert and patient cooperative Pain management: pain level controlled Vital Signs Assessment: post-procedure vital signs reviewed and stable Respiratory status: spontaneous breathing, nonlabored ventilation and respiratory function stable Cardiovascular status: blood pressure returned to baseline and stable Postop Assessment: no signs of nausea or vomiting Anesthetic complications: no       Last Vitals:  Vitals:   10/06/16 0820 10/06/16 0828  BP: (!) 147/106 (!) 142/96  Pulse: 68 71  Resp: (!) 22 18  Temp:      Last Pain:  Vitals:   10/06/16 0624  TempSrc: Oral                 Willliam Pettet,E. Herley Bernardini
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
## 2016-10-06 NOTE — Transfer of Care (Signed)
Immediate Anesthesia Transfer of Care Note  Patient: Darren Mcbride  Procedure(s) Performed: Procedure(s): ESOPHAGOGASTRODUODENOSCOPY (EGD) WITH PROPOFOL (N/A)  Patient Location: PACU  Anesthesia Type:MAC  Level of Consciousness:  sedated, patient cooperative and responds to stimulation  Airway & Oxygen Therapy:Patient Spontanous Breathing and Patient connected to face mask oxgen  Post-op Assessment:  Report given to PACU RN and Post -op Vital signs reviewed and stable  Post vital signs:  Reviewed and stable  Last Vitals:  Vitals:   10/06/16 0624  BP: (!) 148/98  Pulse: 63  Resp: (!) 21  Temp: 16.1 C    Complications: No apparent anesthesia complications

## 2016-10-06 NOTE — Anesthesia Preprocedure Evaluation (Addendum)
Anesthesia Evaluation  Patient identified by MRN, date of birth, ID band Patient awake    Reviewed: Allergy & Precautions, NPO status , Patient's Chart, lab work & pertinent test results  History of Anesthesia Complications Negative for: history of anesthetic complications  Airway Mallampati: II  TM Distance: >3 FB Neck ROM: Full    Dental  (+) Missing, Poor Dentition, Dental Advisory Given   Pulmonary  H/o laryngeal cancer: XRT   breath sounds clear to auscultation       Cardiovascular (-) angina Rhythm:Regular Rate:Normal  '09 ECHO: EF 50%, valves OK   Neuro/Psych Autism    GI/Hepatic Neg liver ROS, GERD  Medicated,  Endo/Other  Hypothyroidism   Renal/GU negative Renal ROS     Musculoskeletal   Abdominal   Peds  Hematology negative hematology ROS (+)   Anesthesia Other Findings   Reproductive/Obstetrics                            Anesthesia Physical Anesthesia Plan  ASA: III  Anesthesia Plan: MAC   Post-op Pain Management:    Induction: Intravenous  Airway Management Planned: Natural Airway  Additional Equipment:   Intra-op Plan:   Post-operative Plan:   Informed Consent: I have reviewed the patients History and Physical, chart, labs and discussed the procedure including the risks, benefits and alternatives for the proposed anesthesia with the patient or authorized representative who has indicated his/her understanding and acceptance.   Dental advisory given  Plan Discussed with: CRNA and Surgeon  Anesthesia Plan Comments: (Plan routine monitors, MAC)        Anesthesia Quick Evaluation

## 2016-10-06 NOTE — Discharge Instructions (Signed)

## 2016-10-06 NOTE — Interval H&P Note (Signed)
History and Physical Interval Note:  10/06/2016 7:23 AM  Darren Mcbride  has presented today for surgery, with the diagnosis of dysphagia  The various methods of treatment have been discussed with the patient and family. After consideration of risks, benefits and other options for treatment, the patient has consented to  Procedure(s): ESOPHAGOGASTRODUODENOSCOPY (EGD) WITH PROPOFOL (N/A) as a surgical intervention .  The patient's history has been reviewed, patient examined, no change in status, stable for surgery.  I have reviewed the patient's chart and labs.  Questions were answered to the patient's satisfaction.     Milus Banister

## 2016-10-07 ENCOUNTER — Encounter (HOSPITAL_COMMUNITY): Payer: Self-pay | Admitting: Gastroenterology

## 2016-10-12 ENCOUNTER — Ambulatory Visit (INDEPENDENT_AMBULATORY_CARE_PROVIDER_SITE_OTHER): Payer: Medicare HMO | Admitting: Psychology

## 2016-10-12 DIAGNOSIS — R4189 Other symptoms and signs involving cognitive functions and awareness: Secondary | ICD-10-CM | POA: Diagnosis not present

## 2016-10-12 DIAGNOSIS — F840 Autistic disorder: Secondary | ICD-10-CM

## 2016-10-12 NOTE — Progress Notes (Signed)
   Neuropsychology Note  Darren Mcbride returned today for 2 hours of neuropsychological testing with technician, Milana Kidney, BS, under the supervision of Dr. Macarthur Critchley. The patient did not appear overtly distressed by the testing session, per behavioral observation or via self-report to the technician. Rest breaks were offered. Darren Mcbride will return within 2 weeks for a feedback session with Dr. Si Raider at which time his test performances, clinical impressions and treatment recommendations will be reviewed in detail. The patient understands he can contact our office should he require our assistance before this time.  Full report to follow.

## 2016-10-12 NOTE — Telephone Encounter (Signed)
Left message on machine to call back  

## 2016-10-17 NOTE — Telephone Encounter (Signed)
The pt's mother states she would like to wait to set up the MBSS study.  The pt has no problems swallowing at this time. She will call if the symptoms return
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
## 2016-11-03 ENCOUNTER — Encounter: Payer: Self-pay | Admitting: Psychology

## 2016-11-03 ENCOUNTER — Ambulatory Visit (INDEPENDENT_AMBULATORY_CARE_PROVIDER_SITE_OTHER): Payer: Medicare HMO | Admitting: Psychology

## 2016-11-03 DIAGNOSIS — F840 Autistic disorder: Secondary | ICD-10-CM

## 2016-11-03 DIAGNOSIS — F79 Unspecified intellectual disabilities: Secondary | ICD-10-CM | POA: Diagnosis not present

## 2016-12-09 ENCOUNTER — Ambulatory Visit (INDEPENDENT_AMBULATORY_CARE_PROVIDER_SITE_OTHER): Payer: Medicare HMO | Admitting: Internal Medicine

## 2016-12-09 ENCOUNTER — Encounter (INDEPENDENT_AMBULATORY_CARE_PROVIDER_SITE_OTHER): Payer: Self-pay

## 2016-12-09 ENCOUNTER — Encounter: Payer: Self-pay | Admitting: Internal Medicine

## 2016-12-09 VITALS — BP 129/83 | HR 82 | Temp 98.2°F | Wt 144.0 lb

## 2016-12-09 DIAGNOSIS — E038 Other specified hypothyroidism: Secondary | ICD-10-CM | POA: Diagnosis not present

## 2016-12-09 DIAGNOSIS — F840 Autistic disorder: Secondary | ICD-10-CM

## 2016-12-09 DIAGNOSIS — E2740 Unspecified adrenocortical insufficiency: Secondary | ICD-10-CM | POA: Diagnosis not present

## 2016-12-09 DIAGNOSIS — N6089 Other benign mammary dysplasias of unspecified breast: Secondary | ICD-10-CM

## 2016-12-09 DIAGNOSIS — Z923 Personal history of irradiation: Secondary | ICD-10-CM | POA: Diagnosis not present

## 2016-12-09 DIAGNOSIS — Z0000 Encounter for general adult medical examination without abnormal findings: Secondary | ICD-10-CM

## 2016-12-09 DIAGNOSIS — N6489 Other specified disorders of breast: Secondary | ICD-10-CM

## 2016-12-09 DIAGNOSIS — Z8521 Personal history of malignant neoplasm of larynx: Secondary | ICD-10-CM

## 2016-12-09 DIAGNOSIS — E039 Hypothyroidism, unspecified: Secondary | ICD-10-CM

## 2016-12-09 DIAGNOSIS — Z79899 Other long term (current) drug therapy: Secondary | ICD-10-CM | POA: Diagnosis not present

## 2016-12-09 NOTE — Assessment & Plan Note (Signed)
Assessment  He has no signs or symptoms of clinically significant adrenal insufficiency at this time.  Plan  He will continue the hydrocortisone 20 mg by mouth daily and fludrocortisone 0.05 mg by mouth daily. We will reassess for signs or symptoms of adrenal insufficiency at the follow-up visit.

## 2016-12-09 NOTE — Assessment & Plan Note (Signed)
Assessment  He has no signs or symptoms of clinical hypothyroidism and states he's been compliant with his Synthroid therapy. A TSH obtain 6 month ago was well within the normal range.  Plan  He was encouraged to continue the Synthroid at the current dose and we will reassess the TSH at the follow-up level to assure he is receiving adequate replacement.

## 2016-12-09 NOTE — Patient Instructions (Signed)
It was good to see you again.  1) Keep taking your medications as you are.  2) Keep going to school if it makes you happy.  It will keep focused.  I will see you in 6 months, sooner if necessary.

## 2016-12-09 NOTE — Assessment & Plan Note (Signed)
Assessment  He is due for a mammogram and this is scheduled for May 23.  Plan  He was encouraged to make the scheduled mammogram to assure no changes given the pseudo-angiomatous stromal hyperplasia found on a previous biopsy.

## 2016-12-09 NOTE — Assessment & Plan Note (Signed)
He is up-to-date on his health care maintenance. 

## 2016-12-09 NOTE — Assessment & Plan Note (Signed)
Assessment  Despite Darren Mcbride mother's long-standing concerns he has been stable both clinically and neuropsychologically for several years. Please see the history of present illness for the extensive discussion that his mother, Darren Mcbride, and I had in hopes of improving the relationship and her understanding on the need to be patient and better understanding the underlying process Darren Mcbride has.  Plan  He will continue to interact 4 days a week at the school that he has been going to for 19 years as this has kept him engaged and interested. He is happy with the social interaction and continued time in school was stressed to be important for Darren Mcbride. We will reassess his mother's understanding of his underlying disease and patience with his limitations at the follow-up visit.

## 2016-12-09 NOTE — Progress Notes (Signed)
Subjective:    Patient ID: Darren Mcbride, male    DOB: 1960/09/14, 56 y.o.   MRN: [id number]  HPI  Darren Mcbride is here for follow-up of his mild intellectual disability, adrenal insufficiency, and idiopathic hypothyroidism. Please see the A&P for the status of the pt's chronic medical problems.  Most of today's visit was spent addressing his mother's concerns which have been long-standing, recurrent, consistent, and unchanged despite an extensive workup as well as numerous providers trying to explain to her the issues associated with Darren Mcbride. To be specific, she complains that sometimes he will not answer her questions. I again stressed the fact that after extensive neuropsychological testing it was determined that Darren Mcbride processing speed was slow and that she needed to be patient. She also complained that his voice was very soft at home and unlike when he was with me or at school. I asked Darren Mcbride why this may be and he states that he does not like to speak loudly because overtime it puts a strain on his voice and is uncomfortable given his previous vocal cord cancer requiring radiation. That gave [place] an opportunity to discuss this issue with his mom so she could better understand where he is coming from. She felt he does not like her and I sense he is frustrated by her sometimes overbearing nature. We opened this area of discussion when he asked whether or not rushing would put undue stress on his health. I pointed out that it is okay that he may take a little more time than other people and that that was expected and that he should not feel pressured to do things faster than he is able to do. His mother took note of this and said she will try to be more patient with him in this regard. Finally, we all agreed that we wanted Darren Mcbride to be happy. He is incredibly happy as part of the church choir even though he does not sing because of the strain associated on his voice. The reason this  makes him happy is that some of his best friends are in the choir and he enjoys spending time with them. Also, he states he's incredibly happy at school and is able to keep up with current events and interact with others. Both his mother and I agree we want the best for Darren Mcbride which includes his happiness and that she should encourage rather than discourage him going off to school 4 days a week as this has kept him engaged and sharp.  Review of Systems  Constitutional: Negative for activity change, appetite change and unexpected weight change.  HENT: Positive for sore throat and voice change.        Sore throat with excessive talking.  Voice is changed from pre XRT but otherwise stable.  Respiratory: Negative for chest tightness and shortness of breath.   Cardiovascular: Negative for chest pain.  Gastrointestinal: Negative for abdominal pain, constipation, diarrhea, nausea and vomiting.  Psychiatric/Behavioral: Negative for agitation, behavioral problems, self-injury and sleep disturbance. The patient is not nervous/anxious.       Objective:   Physical Exam  Constitutional: He appears well-developed and well-nourished. No distress.  Eyes: Conjunctivae are normal. Right eye exhibits no discharge. Left eye exhibits no discharge. No scleral icterus.  Cardiovascular: Normal rate and regular rhythm.   Murmur heard. Pulmonary/Chest: Effort normal and breath sounds normal. No respiratory distress. He has no wheezes. He has no rales.  Musculoskeletal: Normal range of motion.  He exhibits no deformity.  Neurological: He is alert.  Skin: Skin is warm and dry. No rash noted. He is not diaphoretic. No erythema.  Nursing note and vitals reviewed.     Assessment & Plan:   Please see problem oriented charting.

## 2016-12-21 ENCOUNTER — Other Ambulatory Visit: Payer: Self-pay | Admitting: Internal Medicine

## 2016-12-21 DIAGNOSIS — E2740 Unspecified adrenocortical insufficiency: Secondary | ICD-10-CM

## 2017-01-04 ENCOUNTER — Ambulatory Visit
Admission: RE | Admit: 2017-01-04 | Discharge: 2017-01-04 | Disposition: A | Discharge status: Home / Self Care | Payer: Medicare HMO | Source: Ambulatory Visit | Attending: Hematology | Admitting: Hematology

## 2017-01-04 DIAGNOSIS — R922 Inconclusive mammogram: Secondary | ICD-10-CM | POA: Diagnosis not present

## 2017-01-04 DIAGNOSIS — Z803 Family history of malignant neoplasm of breast: Secondary | ICD-10-CM

## 2017-01-28 ENCOUNTER — Other Ambulatory Visit: Payer: Self-pay | Admitting: Internal Medicine

## 2017-01-28 DIAGNOSIS — E039 Hypothyroidism, unspecified: Secondary | ICD-10-CM

## 2017-02-20 NOTE — Progress Notes (Signed)
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  NO  Using fluoride trays daily?   Last ENT visit was on: 10/06/2016 EGD with Dr. Ardis Hughs, MD  Other notable issues, if any:

## 2017-02-21 NOTE — Progress Notes (Signed)
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

## 2017-02-22 ENCOUNTER — Ambulatory Visit
Admission: RE | Admit: 2017-02-22 | Discharge: 2017-02-22 | Disposition: A | Discharge status: Home / Self Care | Payer: Medicare (Managed Care) | Source: Ambulatory Visit | Attending: Radiation Oncology | Admitting: Radiation Oncology

## 2017-02-22 DIAGNOSIS — K219 Gastro-esophageal reflux disease without esophagitis: Secondary | ICD-10-CM | POA: Insufficient documentation

## 2017-02-22 DIAGNOSIS — C320 Malignant neoplasm of glottis: Secondary | ICD-10-CM

## 2017-02-22 DIAGNOSIS — E039 Hypothyroidism, unspecified: Secondary | ICD-10-CM | POA: Insufficient documentation

## 2017-02-22 DIAGNOSIS — Z08 Encounter for follow-up examination after completed treatment for malignant neoplasm: Secondary | ICD-10-CM | POA: Insufficient documentation

## 2017-02-22 DIAGNOSIS — Z8521 Personal history of malignant neoplasm of larynx: Secondary | ICD-10-CM | POA: Insufficient documentation

## 2017-02-22 DIAGNOSIS — Z923 Personal history of irradiation: Secondary | ICD-10-CM | POA: Insufficient documentation

## 2017-02-22 DIAGNOSIS — F840 Autistic disorder: Secondary | ICD-10-CM | POA: Insufficient documentation

## 2017-02-22 DIAGNOSIS — R1310 Dysphagia, unspecified: Secondary | ICD-10-CM | POA: Insufficient documentation

## 2017-02-27 ENCOUNTER — Ambulatory Visit (INDEPENDENT_AMBULATORY_CARE_PROVIDER_SITE_OTHER): Payer: Medicare HMO | Admitting: Internal Medicine

## 2017-02-27 DIAGNOSIS — Z818 Family history of other mental and behavioral disorders: Secondary | ICD-10-CM | POA: Diagnosis not present

## 2017-02-27 DIAGNOSIS — G3184 Mild cognitive impairment, so stated: Secondary | ICD-10-CM | POA: Diagnosis not present

## 2017-02-27 DIAGNOSIS — F840 Autistic disorder: Secondary | ICD-10-CM | POA: Diagnosis not present

## 2017-02-27 NOTE — Patient Instructions (Signed)
Darren Mcbride  It was a pleasure meeting you today. Please read over the pamphlets that were given to you today. If you are interested in the programs please call the phone number on the back.

## 2017-02-27 NOTE — Assessment & Plan Note (Addendum)
Assessment:  Autism spectrum disorder Today mother is concerned that patient is not progressing at his current school and is concerned that as she gets older she may not be able to provide for her son.  Per previous clinic notes patient has had extensive neuropsychological testing and it has been determined that Darren Mcbride processing speed was slow.  During today's visit patient was able to answer questions appropriately.  Darren Mcbride states that he goes to school 4 times a week from 8 AM to 3 PM and currently enjoys the activities he does at school. I offered both the mother and patient additional resources such as [facility] to consider as these might be beneficial resources for the future.  Plan -pamphlets for [facility]

## 2017-02-27 NOTE — Anesthesia Postprocedure Evaluation (Deleted)
Anesthesia Post Note  Patient: Darren Mcbride  Procedure(s) Performed: Procedure(s) (LRB): ESOPHAGOGASTRODUODENOSCOPY (EGD) WITH PROPOFOL (N/A)     Anesthesia Post Evaluation  Last Vitals:  Vitals:   10/06/16 0820 10/06/16 0828  BP: (!) 147/106 (!) 142/96  Pulse: 68 71  Resp: (!) 22 18  Temp:      Last Pain:  Vitals:   10/10/16 1417  TempSrc:   PainSc: 0-No pain                 Sebrena Engh,E. Donal Lynam

## 2017-02-27 NOTE — Addendum Note (Signed)
Addendum  created 02/27/17 1547 by Annye Asa, MD   Sign clinical note

## 2017-02-27 NOTE — Progress Notes (Signed)
   CC: Memory issues  HPI:  [name] is a 56 y.o. male with history noted below that presents to the acute care clinic for concerns of memory issues. Patient is accompanied by his mother who provides most of the history.  Patient has autism and has had chronic cognitive impairment throughout his life. Mother is concerned that patient is not self-sufficient and as she gets older she is unsure how he will take care of himself. Patient's mother is currently taking care of her sister with Alzheimer's as well.  Patient is currently going to [employer] 4 days a week from 8:00am-3pm and is part of a choir.  He currently enjoys these activities.     Past Medical History:  Diagnosis Date  . 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 Always had stable electrolytes.    . Allergic rhinitis 02/06/2013  . Autism spectrum disorder 09/22/2011  . Bilateral cataracts    s/p bilateral extraction  . Bilateral high frequency sensorineural hearing loss 10/16/2014   Mild to Moderate on Audiology examination 10/16/2014.  No hearing aides felt needed.  . Cancer ([place]) 09/13/13 bx   vocal cord =invasive squamous cell ca  . Cancer ([place])   . Gastroesophageal reflux disease 05/24/2013  . History of radiation therapy 10/28/13-12/04/13   vocal cord,63Gy/38f  . Hypothyroidism 06/30/2006  . Onychomycosis of toenail 08/30/2013  . Prostate cancer screening 03/18/2016   BRCA 2 has been identified in family members and there is a history of breast, ovarian, and prostate cancer in his family.  He receives annual PSA screening.  . 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  BRCA 2 has been identified in family members and there is a history of breast, ovarian, and prostate cancer in his family.  He receives annual mammograms and is not  interested in chemoprophylaxis or prophylactic bilateral mammograms.  . Thyroid disease   . 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 Varicose veins of lower extremities  02/06/2013    Review of Systems:  Review of Systems  Respiratory: Negative for shortness of breath.   Cardiovascular: Negative for chest pain.  Neurological: Negative for dizziness and headaches.     Physical Exam:  Vitals:   02/27/17 0934  BP: 114/61  Pulse: 87  Temp: 98 F (36.7 C)  TempSrc: Oral  SpO2: 100%  Weight: 144 lb 1.6 oz (65.4 kg)  Height: 5' 8.5" (1.74 m)   Physical Exam  Cardiovascular: Normal rate and regular rhythm.   No murmur heard. Pulmonary/Chest: Effort normal and breath sounds normal. No respiratory distress. He has no wheezes. He has no rales.  Musculoskeletal: He exhibits no edema.  Neurological:  5/5 motor strength in upper and lower extremities bilaterally     Assessment & Plan:   See encounters tab for problem based medical decision making.   Patient discussed with Dr. MDaryll Drown

## 2017-02-27 NOTE — Addendum Note (Signed)
Addendum  created 02/27/17 1616 by Annye Asa, MD   Delete clinical note, Sign clinical note

## 2017-03-01 NOTE — Progress Notes (Signed)
Internal Medicine Clinic Attending  Case discussed with Dr. Hoffman at the time of the visit.  We reviewed the resident's history and exam and pertinent patient test results.  I agree with the assessment, diagnosis, and plan of care documented in the resident's note.  

## 2017-03-20 ENCOUNTER — Telehealth: Payer: Self-pay | Admitting: *Deleted

## 2017-03-20 NOTE — Telephone Encounter (Signed)
On 03-20-17 fax medical records [facility].

## 2017-04-20 ENCOUNTER — Other Ambulatory Visit (HOSPITAL_BASED_OUTPATIENT_CLINIC_OR_DEPARTMENT_OTHER): Payer: Medicare HMO

## 2017-04-20 DIAGNOSIS — Z8521 Personal history of malignant neoplasm of larynx: Secondary | ICD-10-CM

## 2017-04-20 DIAGNOSIS — N62 Hypertrophy of breast: Secondary | ICD-10-CM | POA: Diagnosis not present

## 2017-04-20 DIAGNOSIS — Z125 Encounter for screening for malignant neoplasm of prostate: Secondary | ICD-10-CM

## 2017-04-20 LAB — COMPREHENSIVE METABOLIC PANEL
ALT: 12 U/L (ref 0–55)
ANION GAP: 6 meq/L (ref 3–11)
AST: 21 U/L (ref 5–34)
Albumin: 3.9 g/dL (ref 3.5–5.0)
Alkaline Phosphatase: 56 U/L (ref 40–150)
BUN: 14.6 mg/dL (ref 7.0–26.0)
CHLORIDE: 103 meq/L (ref 98–109)
CO2: 35 meq/L — AB (ref 22–29)
CREATININE: 1 mg/dL (ref 0.7–1.3)
Calcium: 9.6 mg/dL (ref 8.4–10.4)
EGFR: >90 mL/min/{1.73_m2} (ref >90)
GLUCOSE: 70 mg/dL (ref 70–140)
Potassium: 3.9 mEq/L (ref 3.5–5.1)
SODIUM: 143 meq/L (ref 136–145)
TOTAL PROTEIN: 7 g/dL (ref 6.4–8.3)
Total Bilirubin: 0.63 mg/dL (ref 0.20–1.20)

## 2017-04-20 LAB — CBC WITH DIFFERENTIAL/PLATELET
BASO%: 0.4 % (ref 0.0–2.0)
Basophils Absolute: 0 10*3/uL (ref 0.0–0.1)
EOS ABS: 0.1 10*3/uL (ref 0.0–0.5)
EOS%: 1.5 % (ref 0.0–7.0)
HCT: 37 % — ABNORMAL LOW (ref 38.4–49.9)
HGB: 11.2 g/dL — ABNORMAL LOW (ref 13.0–17.1)
LYMPH%: 25.3 % (ref 14.0–49.0)
MCH: 24.4 pg — ABNORMAL LOW (ref 27.2–33.4)
MCHC: 30.3 g/dL — AB (ref 32.0–36.0)
MCV: 80.6 fL (ref 79.3–98.0)
MONO#: 0.4 10*3/uL (ref 0.1–0.9)
MONO%: 8.4 % (ref 0.0–14.0)
NEUT%: 64.4 % (ref 39.0–75.0)
NEUTROS ABS: 3.1 10*3/uL (ref 1.5–6.5)
PLATELETS: 107 10*3/uL — AB (ref 140–400)
RBC: 4.59 10*6/uL (ref 4.20–5.82)
RDW: 14.5 % (ref 11.0–14.6)
WBC: 4.8 10*3/uL (ref 4.0–10.3)
lymph#: 1.2 10*3/uL (ref 0.9–3.3)
nRBC: 0 % (ref 0–0)

## 2017-04-20 NOTE — Progress Notes (Addendum)
Darren Mcbride 56 y.o. 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  Pain issues, if any: No  Using a feeding tube?: No  Weight changes, if any:  Wt Readings from Last 3 Encounters:  04/24/17 144 lb 3.2 oz (65.4 kg)  02/27/17 144 lb 1.6 oz (65.4 kg)  12/09/16 144 lb (65.3 kg)    Swallowing issues, if any: Some difficulty swallowing at times while eating.  Smoking or chewing tobacco? No  Using fluoride trays daily? No  Last ENT visit was on: Dr. Ardis Hughs saw 02-22-1  Other notable issues, if any: Speaking slower and voice is lower for around a month per his mother BP (!) 135/99   Pulse 81   Temp 98 F (36.7 C) (Oral)   Resp 18   Ht 5' 8.5" (1.74 m)   Wt 144 lb 3.2 oz (65.4 kg)   SpO2 99%   BMI 21.61 kg/m

## 2017-04-21 LAB — PSA: PROSTATE SPECIFIC AG, SERUM: 0.9 ng/mL (ref 0.0–4.0)

## 2017-04-24 ENCOUNTER — Ambulatory Visit
Admission: RE | Admit: 2017-04-24 | Discharge: 2017-04-24 | Disposition: A | Discharge status: Home / Self Care | Payer: Medicare (Managed Care) | Source: Ambulatory Visit | Attending: Radiation Oncology | Admitting: Radiation Oncology

## 2017-04-24 ENCOUNTER — Telehealth: Payer: Self-pay | Admitting: Hematology

## 2017-04-24 ENCOUNTER — Encounter: Payer: Self-pay | Admitting: Radiation Oncology

## 2017-04-24 ENCOUNTER — Ambulatory Visit: Payer: Medicare (Managed Care)

## 2017-04-24 VITALS — BP 135/99 | HR 81 | Temp 98.0°F | Resp 18 | Ht 68.5 in | Wt 144.2 lb

## 2017-04-24 DIAGNOSIS — F840 Autistic disorder: Secondary | ICD-10-CM | POA: Diagnosis not present

## 2017-04-24 DIAGNOSIS — Z8521 Personal history of malignant neoplasm of larynx: Secondary | ICD-10-CM | POA: Diagnosis not present

## 2017-04-24 DIAGNOSIS — Z08 Encounter for follow-up examination after completed treatment for malignant neoplasm: Secondary | ICD-10-CM | POA: Diagnosis not present

## 2017-04-24 DIAGNOSIS — R1310 Dysphagia, unspecified: Secondary | ICD-10-CM | POA: Diagnosis not present

## 2017-04-24 DIAGNOSIS — K219 Gastro-esophageal reflux disease without esophagitis: Secondary | ICD-10-CM | POA: Diagnosis not present

## 2017-04-24 DIAGNOSIS — E039 Hypothyroidism, unspecified: Secondary | ICD-10-CM | POA: Diagnosis not present

## 2017-04-24 DIAGNOSIS — Z923 Personal history of irradiation: Secondary | ICD-10-CM | POA: Diagnosis not present

## 2017-04-24 MED ORDER — LARYNGOSCOPY SOLUTION RAD-ONC
15.0000 mL | Freq: Once | TOPICAL | Status: DC
  Filled 2017-04-24: qty 15

## 2017-04-24 NOTE — Telephone Encounter (Signed)
Left message for patient regarding possibly rescheduling his appt to 9/18.

## 2017-04-24 NOTE — Progress Notes (Signed)
[facility]  [phone] [phone]  Clinic Follow-up Note   Patient Care Team: Oval Linsey, MD as PCP - General (Internal Medicine) Jovita Kussmaul, MD as Consulting Physician (General Surgery) 04/27/2017  CHIEF COMPLAINTS:  High risk of breast cancer   HISTORY OF PRESENTING ILLNESS:  Darren Mcbride 56 y.o. male with PMH of autism and mental retardation, laryngeal cancer, is here because of his recent breast PASH, and strong family history. He is accompanied by his mother to our high-risk clinic. History was taken from from his mother who is also his caregiver.  Patient's mother noticed a left breast mass a few months ago, he denied any pain, skin change or nipple discharge. He underwent diagnostic mammogram and ultrasound, which showed a 1.2 cm group of suspicious calcifications within the right breast 12:00 position. He was referred to breast surgeon Dr. Marlou Starks, and underwent excisional biopsy on 03/09/2016. The surgical path showed fibrocystic change and pseudoangiomatous stromal hyperplasia ([name]). He tolerated the surgery well, has recovered well. Denies any significant pain or other symptoms.  His sister was recently diagnosed with breast cancer, and genetic testing was positive for BRCA2 mutation.  His mother had breast cancer at age of 38. His maternal aunt had colon cancer, and 3 maternal uncle had prostate cancer. He has personal history of laryngeal cancer a few years ago, status post radiation. His colonoscopy showed multiple polyps.   He is able to take care of some basic ADLs, goes to adult school during the day, lives with his mother. He feels well, denies any physical discomfort.   Current therapy: Surveillance  Interval History: The patient presents for routine follow-up. He is doing well overall. Bowels are normal. He is eating well. He denies any pain. His caregiver reports he is taking longer to speak and do things lately. He is unable  to take care of himself alone. He is seen at [facility] [facility] on [facility].  MEDICAL HISTORY:  Past Medical History:  Diagnosis Date  . 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 Always had stable electrolytes.    . Allergic rhinitis 02/06/2013  . Autism spectrum disorder 09/22/2011  . Bilateral cataracts    s/p bilateral extraction  . Bilateral high frequency sensorineural hearing loss 10/16/2014   Mild to Moderate on Audiology examination 10/16/2014.  No hearing aides felt needed.  . Cancer ([place]) 09/13/13 bx   vocal cord =invasive squamous cell ca  . Cancer ([place])   . Gastroesophageal reflux disease 05/24/2013  . History of radiation therapy 10/28/13-12/04/13   vocal cord,63Gy/57f  . Hypothyroidism 06/30/2006  . Onychomycosis of toenail 08/30/2013  . Prostate cancer screening 03/18/2016   BRCA 2 has been identified in family members and there is a history of breast, ovarian, and prostate cancer in his family.  He receives annual PSA screening.  . 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  BRCA 2 has been identified in family members and there is a history of breast, ovarian, and prostate cancer in his family.  He receives annual mammograms and is not interested in chemoprophylaxis or prophylactic bilateral mammograms.  . Thyroid disease   . 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 Varicose veins of lower extremities  02/06/2013    SURGICAL HISTORY:  Past Surgical History:  Procedure Laterality Date  . BREAST EXCISIONAL BIOPSY Right 02/2016  . BREAST LUMPECTOMY WITH RADIOACTIVE SEED LOCALIZATION Right 03/09/2016   Procedure: BREAST LUMPECTOMY WITH RADIOACTIVE SEED LOCALIZATION;  Surgeon: Autumn Messing III, MD;  Location: [facility];  Service: General;  Laterality: Right;  . colon polyps  3-18=14   removal of colon growths"colon polyps"  . COLON SURGERY    . COLONOSCOPY WITH PROPOFOL N/A 11/08/2012   Procedure: COLONOSCOPY WITH PROPOFOL;  Surgeon: Milus Banister, MD;  Location: [facility];  Service: Endoscopy;  Laterality: N/A;  . ESOPHAGOGASTRODUODENOSCOPY (EGD) WITH PROPOFOL N/A 10/06/2016   Procedure: ESOPHAGOGASTRODUODENOSCOPY (EGD) WITH PROPOFOL;  Surgeon: Milus Banister, MD;  Location: [facility];  Service: Endoscopy;  Laterality: N/A;  . EYE SURGERY     Bilateral cataract extraction  . HERNIA REPAIR     @ birth    SOCIAL HISTORY: Social History   Social History  . Marital status: Single    Spouse name: N/A  . Number of children: 0  . Years of education: 9th   Occupational History  . student    Social History Main Topics  . Smoking status: Never Smoker  . Smokeless tobacco: Never Used  . Alcohol use No  . Drug use: No  . Sexual activity: Not Currently   Other Topics Concern  . Not on file   Social History Narrative   ** Merged History Encounter **       Patient lives with his mother at home.   Patient is left-handed.   Patient drinks 2 cups of soda daily at home.   Patient is attending [employer].          FAMILY HISTORY: Family History  Problem Relation Age of Onset  . Diabetes Mother   . Hypertension Mother   . Cataracts Mother   . Breast cancer Mother 49       1985, surgery, chemo and XRT  . Schizophrenia Sister   . Diabetes insipidus Sister   . Breast cancer Sister 71       BRCA2 Positive  . Alcoholism Father   . Alcoholism Brother   . Alcoholism Brother   . Alcoholism Brother   . Congestive Heart Failure Brother   . Prostate cancer Paternal Uncle   . Cancer Maternal Aunt        ovarian cancer   . Cancer Maternal Uncle        prostate cancer   . Cancer Maternal Uncle        prostate cancer  . Cancer Maternal Uncle          prostate cancer   . Breast cancer Cousin        4 maternal cousins had breast cancer  . Colon cancer Neg Hx   . Esophageal cancer Neg Hx   . Rectal cancer Neg Hx   . Stomach cancer Neg Hx     ALLERGIES:  has No Known Allergies.  MEDICATIONS:  Current Outpatient Prescriptions  Medication Sig Dispense Refill  . fludrocortisone (FLORINEF) 0.1 MG tablet Take 0.5 tablets (0.05 mg total) by mouth daily. 45 tablet 3  . hydrocortisone (CORTEF) 20 MG tablet Take 1 tablet (20 mg total) by mouth daily. 90 tablet 3  . levothyroxine (SYNTHROID, LEVOTHROID) 88 MCG tablet Take 1 tablet (88 mcg total) by mouth daily. 90 tablet 3   No current facility-administered medications for this visit.     REVIEW OF SYSTEMS:   Constitutional: Denies  fevers, chills or abnormal night sweats Eyes: Denies blurriness of vision, double vision or watery eyes Ears, nose, mouth, throat, and face: Denies mucositis or sore throat Respiratory: Denies cough, dyspnea or wheezes Cardiovascular: Denies palpitation, chest discomfort or lower extremity swelling Gastrointestinal:  Denies nausea, heartburn or change in bowel habits Skin: Denies abnormal skin rashes Lymphatics: Denies new lymphadenopathy or easy bruising Neurological:Denies numbness, tingling or new weaknesses Behavioral/Psych: Mood is stable, no new changes  All other systems were reviewed with the patient and are negative.  PHYSICAL EXAMINATION: ECOG PERFORMANCE STATUS: 0 - Asymptomatic  Vitals:   04/27/17 0956  BP: (!) 148/99  Pulse: 81  Resp: 17  Temp: 98.2 F (36.8 C)  SpO2: 100%   Filed Weights   04/27/17 0956  Weight: 144 lb 6.4 oz (65.5 kg)    GENERAL:alert, no distress and comfortable SKIN: skin color, texture, turgor are normal, no rashes or significant lesions EYES: normal, conjunctiva are pink and non-injected, sclera clear OROPHARYNX:no exudate, no erythema and lips, buccal mucosa, and tongue normal  NECK: supple, thyroid  normal size, non-tender, without nodularity LYMPH:  no palpable lymphadenopathy in the cervical, axillary or inguinal LUNGS: clear to auscultation and percussion with normal breathing effort HEART: regular rate & rhythm and no murmurs and no lower extremity edema ABDOMEN:abdomen soft, non-tender and normal bowel sounds Musculoskeletal:no cyanosis of digits and no clubbing  PSYCH: alert & oriented x 3 with fluent speech NEURO: no focal motor/sensory deficits Breasts: Breast inspection showed them to be symmetrical with no nipple discharge. Surgical incision in right breast has healed well. Palpation of the breasts and axilla revealed no obvious mass that I could appreciate.   LABORATORY DATA:  I have reviewed the data as listed CBC Latest Ref Rng & Units 04/20/2017 04/21/2016 09/29/2015  WBC 4.0 - 10.3 10e3/uL 4.8 5.4 4.8  Hemoglobin 13.0 - 17.1 g/dL 11.2(L) 11.1(L) 11.3(L)  Hematocrit 38.4 - 49.9 % 37.0(L) 36.4(L) 37.5(L)  Platelets 140 - 400 10e3/uL 107(L) 126 Large & giant platelets(L) 92(L)   CMP Latest Ref Rng & Units 04/20/2017 04/21/2016 09/29/2015  Glucose 70 - 140 mg/dl 70 83 105(H)  BUN 7.0 - 26.0 mg/dL 14.6 17.2 16  Creatinine 0.7 - 1.3 mg/dL 1.0 1.1 1.13  Sodium 136 - 145 mEq/L 143 144 138  Potassium 3.5 - 5.1 mEq/L 3.9 4.1 3.8  Chloride 101 - 111 mmol/L - - 98(L)  CO2 22 - 29 mEq/L 35(H) 31(H) 31  Calcium 8.4 - 10.4 mg/dL 9.6 9.2 8.5(L)  Total Protein 6.4 - 8.3 g/dL 7.0 6.7 7.3  Total Bilirubin 0.20 - 1.20 mg/dL 0.63 0.37 0.1(L)  Alkaline Phos 40 - 150 U/L 56 61 61  AST 5 - 34 U/L 21 21 30   ALT 0 - 55 U/L 12 14 16(L)   PATHOLOGY REPORT  Diagnosis Breast, lumpectomy, Right - FIBROCYSTIC CHANGES WITH CALCIFICATIONS. - PSEUDOANGIOMATOUS STROMAL HYPERPLASIA ([place]). - NO EVIDENCE OF MALIGNANCY.  Diagnosis 09/13/2013 Vocal cord, biopsy, mass, left INVASIVE SQUAMOUS CELL CARCINOMA, PLEASE SEE COMMENT. Microscopic Comment All the biopsy fragments are involved by an invasive  moderately differentiated squamous cell carcinoma with keratin pearl formation and desmoplastic stromal reaction. No angiolymphatic invasion or perineural invasion is identified. P16 stain will be performed and an addendum will follow. Dr. Redmond Baseman was paged.  RADIOGRAPHIC STUDIES: I have personally reviewed the radiological images as listed and agreed with the findings in the report. No results found.   Diagnostic mammogram 01/04/17 IMPRESSION: No mammographic evidence for malignancy. RECOMMENDATION: Clinical  followup is recommended. Imaging can be performed as needed.  ASSESSMENT & PLAN: 56 year old male, past medical history of autism, mentally retarded lesion, laryngeal cancer, colon polyps, strong family history of breast, ovary and prostate cancers, (+) BRCA2 in her sister, presented with a breast mass  1. Right breast pseudoangiomatous stroma hyperplasia (PASH) and family history of BRCA2 related malignancies  -I previously reviewed his surgical pathology findings. PAS H is consider a benign breast lesion, not a high risk for breast cancer. -However he has known family history of BRCA2 mutation in his sister, and is strong family history of breast, ovary, and prostate cancer, which are all related to BRCA2 mutations -I recommend him to consider genetic testing, at least to BRCA2 mutation, to see if he would need prophylactic bilateral mastectomy. However due to his mental health issue, his mother declined genetic testing and prophylactic surgery even if his test was positive. I think this is very reasonable.  -I strongly recommend him to have annual screening mammogram, he agrees. -Giving his mental illness, I did not strongly recommend chemotherapy prevention with tamoxifen. His mother agrees.  -I also encourage him to have prostate screening test, such as annual PSA. His mother agrees -He will continue screening colonoscopy, and follow-up with Dr. Ardis Hughs -Recent mammogram in May 2018  was normal. -Labs reviewed. Mild anemia and mild thrombocytopenia. Platelet count low at 107. PSA is normal. Liver kidney function is normal.  -He is clinically doing well, breast exam was unremarkable, no clinical concern for breast cancer. -His mother prefers him to follow-up with [place]. I'll see him once a year.  2. History of early stage laryngeal cancer -Status post radiation -Continue follow-up with Dr. Lisbeth Renshaw  3. Autism, mental titration -He will continue follow-up with his primary care physician for other medical issues and preventative medicine.  4. Hypertension -BP slightly elevated today, 148/99. -Encouraged to monitor BP regularly at home or at [facility].  5. Mild anemia and Mild thrombocytopenia -he has chronic mild anemia and thrombocytopenia -HGB 11.2 today. HCT 37 today. -Platelet 107 today. -Iron study and B12 checked in the past which were normal. No evidence of nutritional anemia. -Giving the overall stable blood counts over several years, my suspicion for MDS or other primary bone marrow disease is low, I do not recommend bone marrow biopsy, we'll continue clinical monitoring.  Plan -labs reviewed -repeat mammogram in May 2019 -I will see him in one year with lab    All questions were answered. The patient knows to call the clinic with any problems, questions or concerns. I spent 20 minutes counseling the patient face to face. The total time spent in the appointment was 25 minutes and more than 50% was on counseling.     Truitt Merle, MD 04/27/2017   This document serves as a record of services personally performed by Truitt Merle, MD. It was created on her behalf by Arlyce Harman, a trained medical scribe. The creation of this record is based on the scribe's personal observations and the provider's statements to them. This document has been checked and approved by the attending provider.

## 2017-04-24 NOTE — Progress Notes (Signed)
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 He has been NED in the last 3 years and comes today for follow-up. Since his last visit, he did meet with Dr. Ardis Hughs and an EGD in February was negative for esophageal or gastric abnormalities. He does have a tortuous oropharynx however per the procedure notes.  On review of systems, the patient reports that he is doing well overall. He denies any chest pain, shortness of breath, cough, fevers, chills, night sweats, unintended weight changes. He continues to have a dry mouth and some intermittent trouble with swallowing. He denies any bowel or bladder disturbances, and denies abdominal pain, nausea or vomiting. He denies any new musculoskeletal or joint aches or pains, new skin lesions or concerns. A complete review of systems is obtained and is otherwise negative.   Past Medical History:  Past Medical History:  Diagnosis Date  . 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 Always had stable electrolytes.    . Allergic rhinitis 02/06/2013  . Autism spectrum disorder 09/22/2011  . Bilateral cataracts    s/p bilateral extraction  . Bilateral high frequency sensorineural hearing loss 10/16/2014   Mild to Moderate on Audiology examination 10/16/2014.  No hearing aides felt needed.  . Cancer ([place]) 09/13/13 bx   vocal cord =invasive squamous cell ca  . Cancer ([place])   .  Gastroesophageal reflux disease 05/24/2013  . History of radiation therapy 10/28/13-12/04/13   vocal cord,63Gy/75f  . Hypothyroidism 06/30/2006  . Onychomycosis of toenail 08/30/2013  . Prostate cancer screening 03/18/2016   BRCA 2 has been identified in family members and there is a history of breast, ovarian, and prostate cancer in his family.  He receives annual PSA screening.  . 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  BRCA 2 has been identified in family members and there is a history of breast, ovarian, and prostate cancer in his family.  He receives annual mammograms and is not interested in chemoprophylaxis or prophylactic bilateral mammograms.  . Thyroid disease   . 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 Varicose veins of lower extremities  02/06/2013    Past Surgical History: Past Surgical History:  Procedure Laterality Date  . BREAST EXCISIONAL BIOPSY Right 02/2016  . BREAST LUMPECTOMY WITH RADIOACTIVE SEED LOCALIZATION Right 03/09/2016   Procedure: BREAST LUMPECTOMY WITH RADIOACTIVE SEED LOCALIZATION;  Surgeon: PAutumn MessingIII, MD;  Location: [facility]  Service: General;  Laterality: Right;  . colon polyps  3-18=14   removal of colon growths"colon polyps"  . COLON SURGERY    . COLONOSCOPY WITH PROPOFOL N/A 11/08/2012   Procedure: COLONOSCOPY WITH PROPOFOL;  Surgeon: DMilus Banister MD;  Location: [facility];  Service: Endoscopy;  Laterality: N/A;  . ESOPHAGOGASTRODUODENOSCOPY (EGD) WITH PROPOFOL N/A 10/06/2016   Procedure: ESOPHAGOGASTRODUODENOSCOPY (EGD) WITH PROPOFOL;  Surgeon: Milus Banister, MD;  Location: [facility];  Service: Endoscopy;  Laterality: N/A;  . EYE SURGERY     Bilateral cataract extraction  . HERNIA REPAIR      @ birth    Social History:  Social History   Social History  . Marital status: Single    Spouse name: N/A  . Number of children: 0  . Years of education: 9th   Occupational History  . student    Social History Main Topics  . Smoking status: Never Smoker  . Smokeless tobacco: Never Used  . Alcohol use No  . Drug use: No  . Sexual activity: Not Currently   Other Topics Concern  . Not on file   Social History Narrative   ** Merged History Encounter **       Patient lives with his mother at home.   Patient is left-handed.   Patient drinks 2 cups of soda daily at home.   Patient is attending [employer].          Family History: Family History  Problem Relation Age of Onset  . Diabetes Mother   . Hypertension Mother   . Cataracts Mother   . Breast cancer Mother 1       1985, surgery, chemo and XRT  . Schizophrenia Sister   . Diabetes insipidus Sister   . Breast cancer Sister 70       BRCA2 Positive  . Alcoholism Father   . Alcoholism Brother   . Alcoholism Brother   . Alcoholism Brother   . Congestive Heart Failure Brother   . Prostate cancer Paternal Uncle   . Cancer Maternal Aunt        ovarian cancer   . Cancer Maternal Uncle        prostate cancer   . Cancer Maternal Uncle        prostate cancer  . Cancer Maternal Uncle        prostate cancer   . Breast cancer Cousin        4 maternal cousins had breast cancer  . Colon cancer Neg Hx   . Esophageal cancer Neg Hx   . Rectal cancer Neg Hx   . Stomach cancer Neg Hx      ALLERGIES:  has No Known Allergies.  Meds: Current Outpatient Prescriptions  Medication Sig Dispense Refill  . fludrocortisone (FLORINEF) 0.1 MG tablet Take 0.5 tablets (0.05 mg total) by mouth daily. 45 tablet 3  . hydrocortisone (CORTEF) 20 MG tablet Take 1 tablet (20 mg total) by mouth daily. 90 tablet 3  . levothyroxine (SYNTHROID, LEVOTHROID) 88 MCG tablet Take 1 tablet (88 mcg total) by mouth daily. 90 tablet 3   No current  facility-administered medications for this encounter.     Physical Findings:  height is 5' 8.5" (1.74 m) and weight is 144 lb 3.2 oz (65.4 kg). His oral temperature is 98 F (36.7 C). His blood pressure is 135/99 (abnormal) and his pulse is 81. His respiration is 18 and oxygen saturation is 99%.   In general this is a well appearing African American gentleman in no acute distress. He is alert and oriented x4 and appropriate throughout the examination. Skin is intact without any evidence of gross lesions. Cardiovascular exam reveals a regular rate and rhythm, no clicks rubs or murmurs are auscultated. Chest is clear to auscultation bilaterally. Lymphatic  assessment is performed and does not reveal any adenopathy in the cervical, supraclavicular, axillary, or inguinal chains. Abdomen has active bowel sounds in all quadrants and is intact. The abdomen is soft, non tender, non distended.  PROCEDURE NOTE: After obtaining consent and anesthetizing the nasal cavity with topical lidocaine and phenylephrine, the flexible endoscope was introduced and passed through the nasal cavity. Complete visualization was obtained of the vocal cord region. No lesions or suspicious findings within the larynx, hypopharynx, oropharynx or nasopharynx.    Lab Findings: Lab Results  Component Value Date   WBC 4.8 04/20/2017   HGB 11.2 (L) 04/20/2017   HCT 37.0 (L) 04/20/2017   MCV 80.6 04/20/2017   PLT 107 (L) 04/20/2017   Radiographic Findings: No results found.  Impression/Plan:  1. 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 The patient appears to be NED and will have a TSH level drawn today. We will plan to see him back in 1 year's time or sooner if he is symptomatic. 2. Dysphagia. The patient's symptoms are not GI related. I will check with [facility] to see if they have a speech pathologist on site to evaluate for swallow testing.    In a visit lasting 25 minutes, greater than  50% of the time was spent face to face discussing his symptoms of dysphagia, and coordinating the patient's care     Carola Rhine, [place], MD  [place]: [phone]  Fax: [phone] [email or web address]  Skype  LinkedIn

## 2017-04-26 ENCOUNTER — Telehealth: Payer: Self-pay | Admitting: Radiation Oncology

## 2017-04-26 DIAGNOSIS — Z8481 Family history of carrier of genetic disease: Secondary | ICD-10-CM | POA: Insufficient documentation

## 2017-04-26 NOTE — Telephone Encounter (Signed)
I reached out to [facility] is the patient's new insurance provider who also has provider's staff to see focused, I asked  Rise Patience one of the retractors if he could be evaluated by speech pathology for swallowing evaluation, apparently his TSH was checked last week and this was normal at 2.9, and we discussed her annual plans to follow the patient. She stated that they do have [facility] on site to evaluate the patient for speech anomaly and/or swallow eval. We will follow with him expectantly regarding this issue.

## 2017-04-27 ENCOUNTER — Telehealth: Payer: Self-pay

## 2017-04-27 ENCOUNTER — Ambulatory Visit (HOSPITAL_BASED_OUTPATIENT_CLINIC_OR_DEPARTMENT_OTHER): Payer: Medicare (Managed Care) | Admitting: Hematology

## 2017-04-27 DIAGNOSIS — F840 Autistic disorder: Secondary | ICD-10-CM | POA: Diagnosis not present

## 2017-04-27 DIAGNOSIS — D649 Anemia, unspecified: Secondary | ICD-10-CM

## 2017-04-27 DIAGNOSIS — Z1501 Genetic susceptibility to malignant neoplasm of breast: Secondary | ICD-10-CM | POA: Diagnosis not present

## 2017-04-27 DIAGNOSIS — Z8521 Personal history of malignant neoplasm of larynx: Secondary | ICD-10-CM

## 2017-04-27 DIAGNOSIS — D696 Thrombocytopenia, unspecified: Secondary | ICD-10-CM | POA: Diagnosis not present

## 2017-04-27 DIAGNOSIS — I10 Essential (primary) hypertension: Secondary | ICD-10-CM

## 2017-04-27 DIAGNOSIS — Z8481 Family history of carrier of genetic disease: Secondary | ICD-10-CM

## 2017-04-27 NOTE — Telephone Encounter (Signed)
Printed avs and calender for yearly appointments. Per 9/13 los

## 2017-04-28 ENCOUNTER — Encounter: Payer: Self-pay | Admitting: Hematology

## 2017-06-15 IMAGING — CT CT ABD-PELV W/ CM
2 of 5 series · 4 of 46 positions shown, 6 images · IV contrast (omnipaque)
Comparison: None.

CLINICAL DATA: Vomiting since last night

EXAM:
CT ABDOMEN AND PELVIS WITH CONTRAST
TECHNIQUE: Multidetector CT imaging of the abdomen and pelvis was performed
using the standard protocol following bolus administration of
intravenous contrast.
CONTRAST:  100mL OMNIPAQUE IOHEXOL 300 MG/ML  SOLN

[Series 204: coronal · coronal · 0.45mm/px · 3 of 122 slices shown, 4 images]
[im 27/122  soft-tissue]
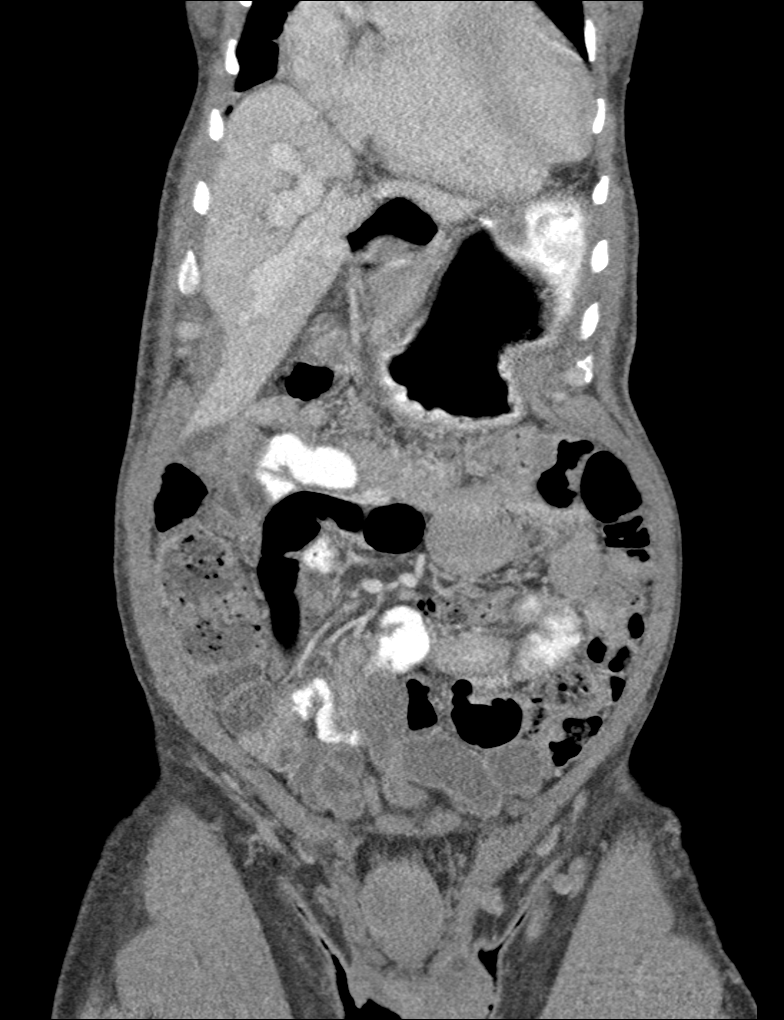
[im 27/122  bone]
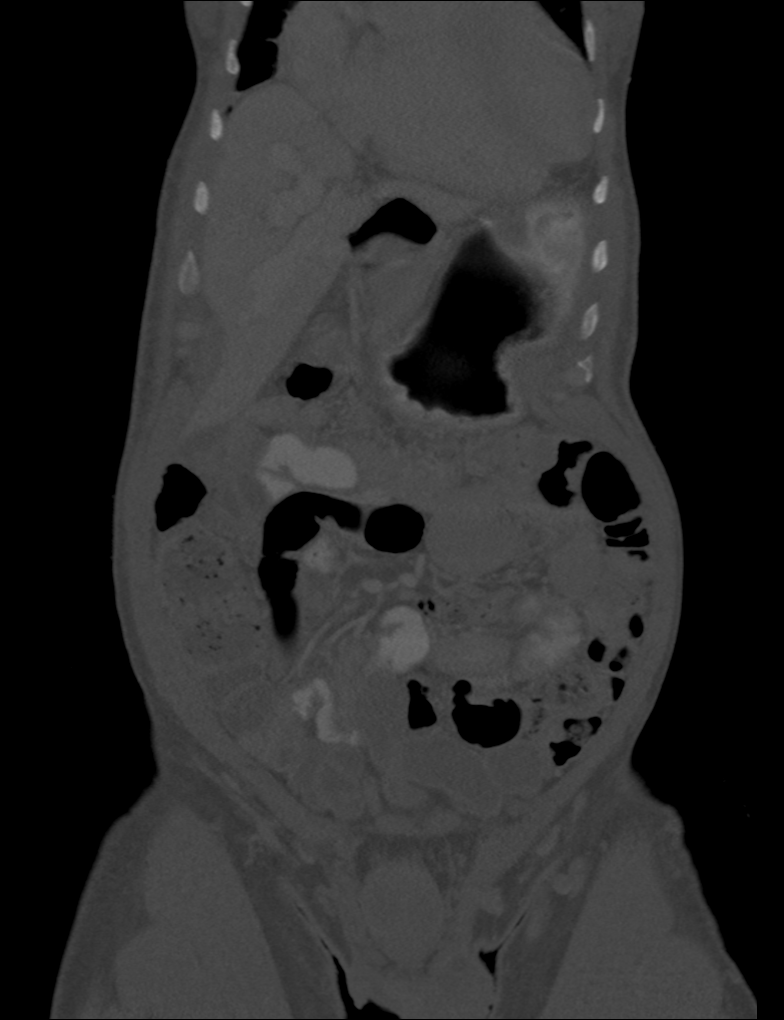
[im 68/122  soft-tissue]
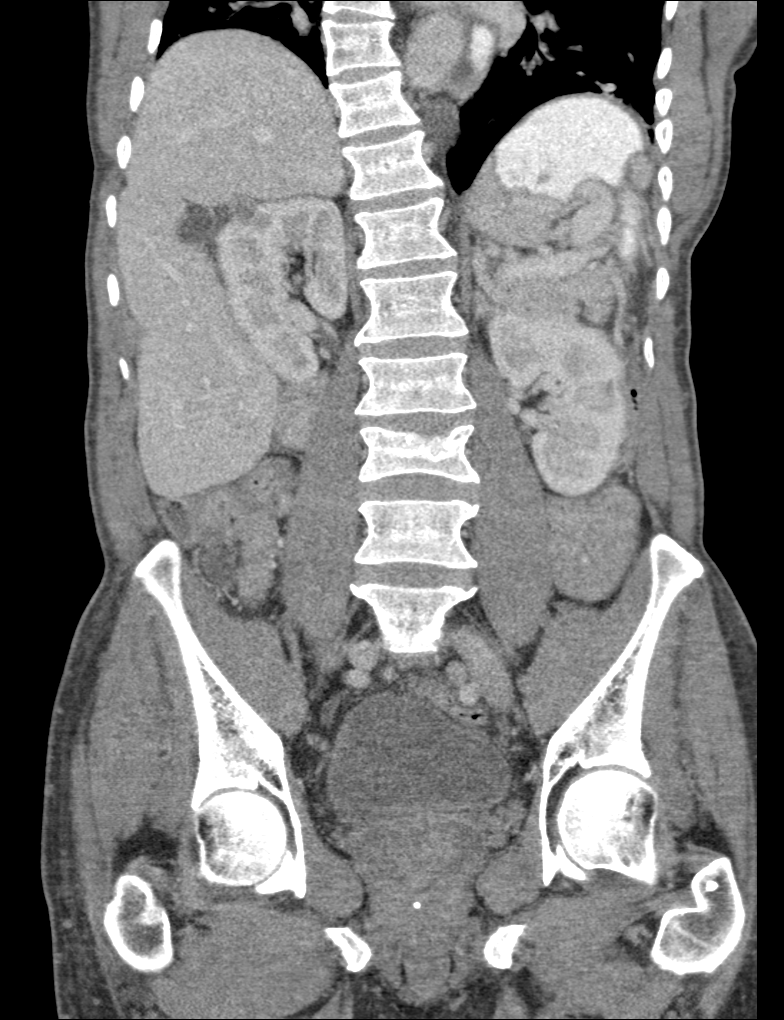
[im 95/122  soft-tissue]
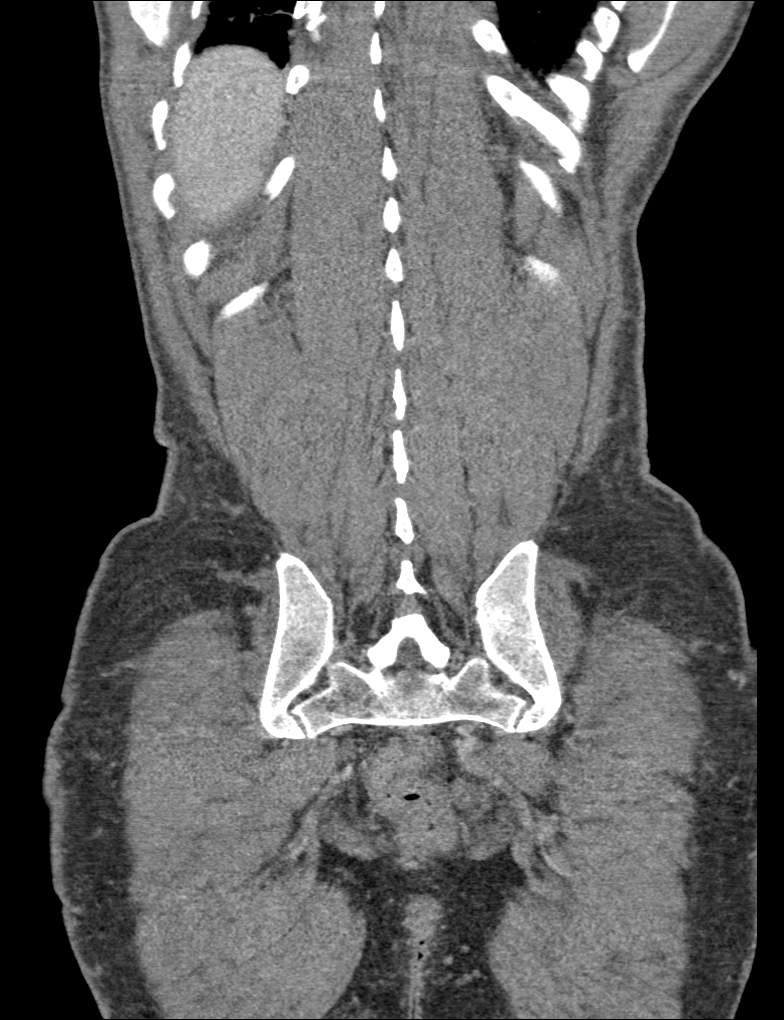

[Series 205: sagittal · sagittal · 0.45mm/px · 1 of 159 slices shown, 2 images]
[im 53/159  soft-tissue]
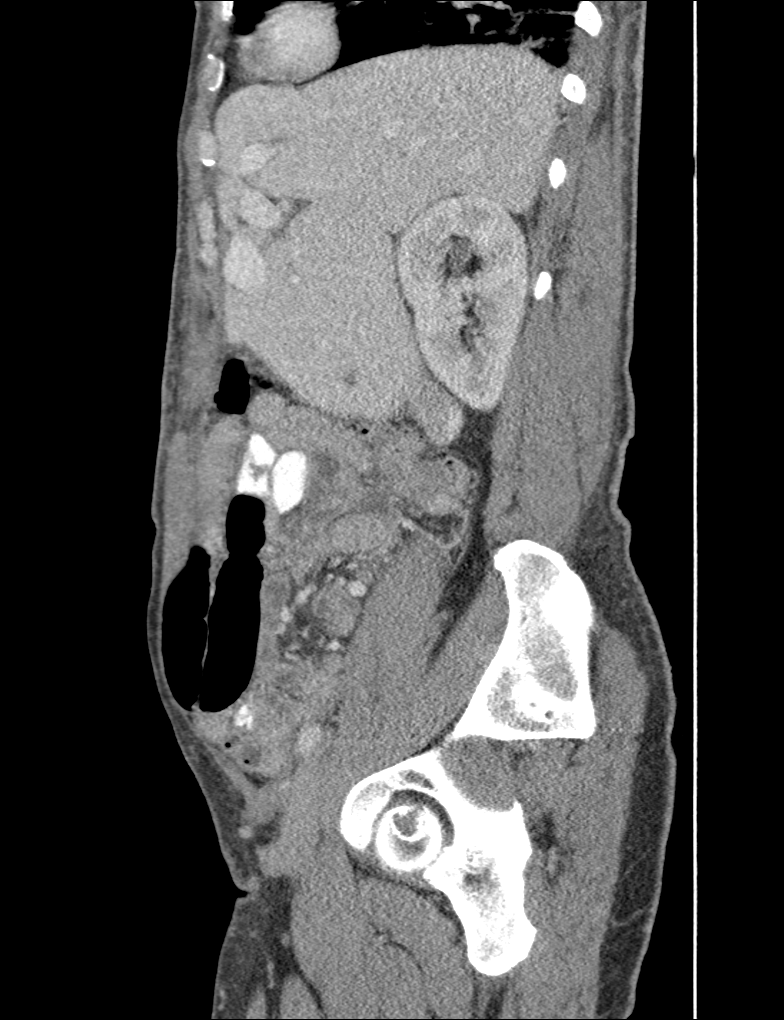
[im 53/159  bone]
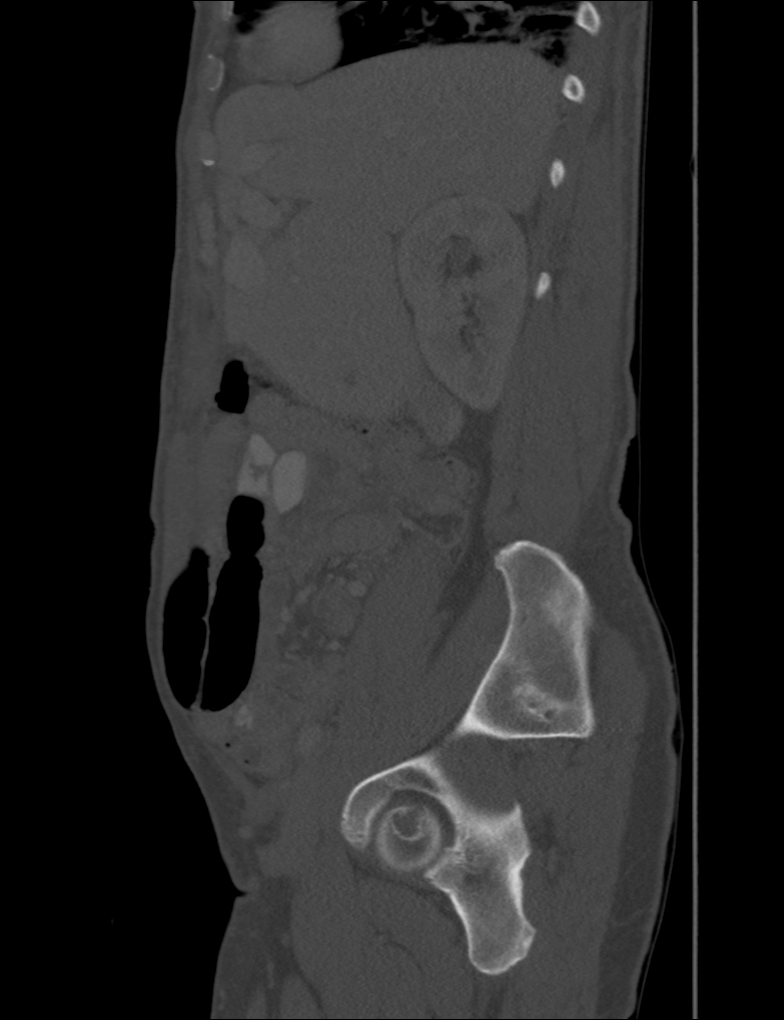

[4 of 46 positions shown; findings below may reference images not displayed]

FINDINGS: Lower chest: No pleural fluid identified. The lung bases are clear.

Hepatobiliary: Unusual appearance of the liver which is almost
entirely within the right hemi abdomen. A small lateral segment of
left lobe is noted with relative hypertrophy of the medial segment
and right hepatic lobe. The gallbladder appears normal. No biliary
dilatation identified.

Pancreas: Normal appearance of the pancreas.

Spleen: The spleen is unremarkable.

Adrenals/Urinary Tract: The adrenal glands are normal. Normal
appearance of the left kidney. Intermediate attenuating structure
arising from the upper pole of the right kidney measures 1.5 cm and
36 HU there appears to be a septation within this structure, image
number 70 of series 204. The urinary bladder appears normal.

Stomach/Bowel: The stomach is normal. The small bowel loops have a
normal caliber. The duodenum does not appear to cross the midline
and there may be a malrotation deformity. No evidence for midgut
volvulus. The cecum appears to be in the normal location within the
right lower quadrant. The appendix is visualized and appears normal.

Vascular/Lymphatic: No abnormal bowel dilatation.

Reproductive: Prostate gland enlargement identified.

Other: No free fluid or fluid collections within the abdomen or
pelvis.

Musculoskeletal: The visualized bony structures are unremarkable. No
aggressive lytic or sclerotic bone lesions.
IMPRESSION: 1. No acute findings identified within the abdomen or pelvis. No
specific findings identified to suggest bowel obstruction.
2. Suspect variant of malrotation deformity. No evidence for midgut
volvulus.
3. The appendix is visualized and appears normal.
4. Indeterminate lesion arising from upper pole of right kidney.
Suspect complex cyst. Suggest further assessment with followup,
nonemergent MR of the kidneys.

## 2018-01-05 ENCOUNTER — Ambulatory Visit
Admission: RE | Admit: 2018-01-05 | Discharge: 2018-01-05 | Disposition: A | Discharge status: Home / Self Care | Payer: Medicare (Managed Care) | Source: Ambulatory Visit | Attending: Hematology | Admitting: Hematology

## 2018-01-05 DIAGNOSIS — Z8481 Family history of carrier of genetic disease: Secondary | ICD-10-CM

## 2018-01-17 ENCOUNTER — Encounter: Payer: Self-pay | Admitting: *Deleted

## 2018-02-05 ENCOUNTER — Encounter: Payer: Medicare (Managed Care) | Admitting: Internal Medicine

## 2018-02-09 ENCOUNTER — Encounter: Payer: Medicare (Managed Care) | Admitting: Internal Medicine

## 2018-03-02 ENCOUNTER — Other Ambulatory Visit: Payer: Self-pay | Admitting: Gerontology

## 2018-03-02 DIAGNOSIS — Z79890 Hormone replacement therapy (postmenopausal): Secondary | ICD-10-CM

## 2018-03-11 ENCOUNTER — Telehealth: Payer: Self-pay | Admitting: Internal Medicine

## 2018-03-11 NOTE — Telephone Encounter (Signed)
Received a phone call from EMS, Darren Mcbride,  [phone]) that patient was found by family in his bed with audible gurgling and agonal breathing which is when they called EMS. First responders arrived, and he was found to have PEA. CPR was started at 07:44 with intubation. EMS noted bleeding in upper airway that was suctioned. Never had ROSC. He was pronounced at 8:18 am. Family last saw him well at 10pm the previous evening watching tv before going to bed. He complained of not feeling well the last few days, but no other complaints or abnormalities that family noticed. No history of substance abuse.

## 2018-03-15 DEATH — deceased

## 2018-04-18 ENCOUNTER — Other Ambulatory Visit: Payer: Medicare (Managed Care)

## 2018-04-19 ENCOUNTER — Other Ambulatory Visit: Payer: Self-pay

## 2018-04-26 ENCOUNTER — Ambulatory Visit: Payer: Self-pay | Admitting: Hematology

## 2018-05-01 ENCOUNTER — Ambulatory Visit: Payer: Medicare (Managed Care) | Admitting: Radiation Oncology
